# Patient Record
Sex: Male | Born: 1960 | Race: Black or African American | Hispanic: No | Marital: Married | Smoking: Former smoker
Health system: Southern US, Community
[De-identification: ages and names within clinical notes are randomized; demographics above are authoritative.]

## PROBLEM LIST (undated history)

## (undated) DIAGNOSIS — I10 Essential (primary) hypertension: Secondary | ICD-10-CM

## (undated) DIAGNOSIS — E7800 Pure hypercholesterolemia, unspecified: Secondary | ICD-10-CM

## (undated) DIAGNOSIS — I509 Heart failure, unspecified: Secondary | ICD-10-CM

## (undated) DIAGNOSIS — K219 Gastro-esophageal reflux disease without esophagitis: Secondary | ICD-10-CM

## (undated) DIAGNOSIS — R0600 Dyspnea, unspecified: Secondary | ICD-10-CM

## (undated) DIAGNOSIS — M549 Dorsalgia, unspecified: Secondary | ICD-10-CM

## (undated) DIAGNOSIS — G4730 Sleep apnea, unspecified: Secondary | ICD-10-CM

## (undated) DIAGNOSIS — M1990 Unspecified osteoarthritis, unspecified site: Secondary | ICD-10-CM

## (undated) HISTORY — PX: BACK SURGERY: SHX140

## (undated) HISTORY — PX: MANDIBLE FRACTURE SURGERY: SHX706

---

## 2007-07-13 ENCOUNTER — Encounter: Admission: RE | Admit: 2007-07-13 | Discharge: 2007-07-13 | Payer: Self-pay | Admitting: Neurology

## 2010-10-30 ENCOUNTER — Emergency Department (HOSPITAL_BASED_OUTPATIENT_CLINIC_OR_DEPARTMENT_OTHER)
Admission: EM | Admit: 2010-10-30 | Discharge: 2010-10-30 | Disposition: A | Discharge status: Home / Self Care | Payer: Self-pay | Attending: Emergency Medicine | Admitting: Emergency Medicine

## 2010-10-30 ENCOUNTER — Encounter: Payer: Self-pay | Admitting: *Deleted

## 2010-10-30 ENCOUNTER — Emergency Department (INDEPENDENT_AMBULATORY_CARE_PROVIDER_SITE_OTHER): Payer: Self-pay

## 2010-10-30 DIAGNOSIS — M25562 Pain in left knee: Secondary | ICD-10-CM

## 2010-10-30 DIAGNOSIS — M25569 Pain in unspecified knee: Secondary | ICD-10-CM

## 2010-10-30 DIAGNOSIS — I10 Essential (primary) hypertension: Secondary | ICD-10-CM | POA: Insufficient documentation

## 2010-10-30 DIAGNOSIS — E7800 Pure hypercholesterolemia, unspecified: Secondary | ICD-10-CM | POA: Insufficient documentation

## 2010-10-30 DIAGNOSIS — E119 Type 2 diabetes mellitus without complications: Secondary | ICD-10-CM | POA: Insufficient documentation

## 2010-10-30 HISTORY — DX: Pure hypercholesterolemia, unspecified: E78.00

## 2010-10-30 HISTORY — DX: Essential (primary) hypertension: I10

## 2010-10-30 MED ORDER — METHYLPREDNISOLONE SODIUM SUCC 125 MG IJ SOLR
125.0000 mg | Freq: Once | INTRAMUSCULAR | Status: AC
  Administered 2010-10-30: 125 mg via INTRAMUSCULAR

## 2010-10-30 MED ORDER — METHYLPREDNISOLONE 4 MG PO KIT: PACK | ORAL | Status: AC

## 2010-10-30 NOTE — ED Notes (Signed)
Pt reports left knee pain since last night. Described as aching and constant. Worsens with sitting for long periods of time. Limited ROM d/t pain. Using a cane for ambulatory assistance.

## 2010-10-30 NOTE — ED Notes (Addendum)
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 Hx gout, but states pain usually occurs in his ankles. Took an indomethacin tab and motrin 800mg  PTA

## 2010-10-30 NOTE — ED Provider Notes (Signed)
History   L knee pain h/o gout, has gout medications at home but has not started them.  Pain started yesterday worse with moving and hurts to bear weight, no fall, trauma or known injury. No rash, swelling or fevers.   CSN: [id number] Arrival date & time: 10/30/2010  2:50 AM  Chief Complaint  Patient presents with  . Knee Pain   Patient is a 50 y.o. male presenting with knee pain. The history is provided by the patient.  Knee Pain This is a recurrent problem. The current episode started yesterday. The problem occurs constantly. The problem has not changed since onset.Pertinent negatives include no chest pain, no abdominal pain, no headaches and no shortness of breath. The symptoms are aggravated by walking and bending. The symptoms are relieved by nothing. Treatments tried: took ibupriofen PTA. The treatment provided no relief.    Past Medical History  Diagnosis Date  . Hypertension   . Gout   . Diabetes mellitus   . High cholesterol     Past Surgical History  Procedure Date  . Back surgery     No family history on file.  History  Substance Use Topics  . Smoking status: Current Everyday Smoker  . Smokeless tobacco: Not on file  . Alcohol Use: No      Review of Systems  Constitutional: Negative for fever and chills.  HENT: Negative for neck pain and neck stiffness.   Eyes: Negative for pain.  Respiratory: Negative for shortness of breath.   Cardiovascular: Negative for chest pain.  Gastrointestinal: Negative for abdominal pain.  Genitourinary: Negative for dysuria.  Musculoskeletal: Negative for myalgias, back pain and joint swelling.  Skin: Negative for rash.  Neurological: Negative for headaches.  All other systems reviewed and are negative.    Physical Exam  BP 198/78  Pulse 78  Temp(Src) 98 F (36.7 C) (Oral)  Resp 16  Ht 5\' 11"  (1.803 m)  Wt 318 lb (144.244 kg)  BMI 44.35 kg/m2  SpO2 100%  Physical Exam  Constitutional: He is oriented to person,  place, and time. He appears well-developed and well-nourished.  HENT:  Head: Normocephalic and atraumatic.  Eyes: Conjunctivae and EOM are normal. Pupils are equal, round, and reactive to light.  Neck: Full passive range of motion without pain. Neck supple. No thyromegaly present.  Cardiovascular: Normal rate, regular rhythm, S1 normal, S2 normal and intact distal pulses.   Pulmonary/Chest: Effort normal and breath sounds normal.  Abdominal: Soft. Bowel sounds are normal. There is no tenderness. There is no CVA tenderness.  Musculoskeletal: Normal range of motion.       LLE: TTP over patella and superior aspect of knee, no erythema, no warmth, no swelling, good ROM no deformity, no posterior tenderness, no calf swelling, no cords, distal N/V intact  Neurological: He is alert and oriented to person, place, and time. He has normal strength and normal reflexes. No cranial nerve deficit or sensory deficit. He displays a negative Romberg sign. GCS eye subscore is 4. GCS verbal subscore is 5. GCS motor subscore is 6.       Ambulates with a cane  Skin: Skin is warm and dry. No rash noted. No cyanosis. Nails show no clubbing.  Psychiatric: He has a normal mood and affect. His speech is normal and behavior is normal.    ED Course  Procedures  MDM L knee pain gout versus possible osteoarthritis.  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  Xray obtained and reviewed. Plan medrol dose pack- PT aware will elevate his sugars.  He will take his gout medications as prescribed by his physician and follow up in the clinic.   Dg Knee 2 Views Left  10/30/2010  *RADIOLOGY REPORT*  Clinical Data: Sudden onset of left knee pain.  LEFT KNEE - 1-2 VIEW  Comparison: None.  Findings: There is no evidence of fracture or dislocation.  The joint spaces are preserved.  No significant degenerative change is seen; the patellofemoral  joint is grossly unremarkable in appearance.  An enthesophyte is noted at the superior pole of the patella.  No significant joint effusion is seen.  The visualized soft tissues are normal in appearance.  IMPRESSION: No evidence of fracture or dislocation.  Original Report Authenticated By: Tonia Ghent, M.D.        Sunnie Nielsen, MD 10/30/10 [phone]

## 2011-02-25 ENCOUNTER — Encounter (HOSPITAL_BASED_OUTPATIENT_CLINIC_OR_DEPARTMENT_OTHER): Payer: Self-pay | Admitting: *Deleted

## 2011-02-25 ENCOUNTER — Emergency Department (HOSPITAL_BASED_OUTPATIENT_CLINIC_OR_DEPARTMENT_OTHER)
Admission: EM | Admit: 2011-02-25 | Discharge: 2011-02-25 | Disposition: A | Discharge status: Home / Self Care | Payer: BC Managed Care – PPO | Attending: Emergency Medicine | Admitting: Emergency Medicine

## 2011-02-25 DIAGNOSIS — Z79899 Other long term (current) drug therapy: Secondary | ICD-10-CM | POA: Insufficient documentation

## 2011-02-25 DIAGNOSIS — E119 Type 2 diabetes mellitus without complications: Secondary | ICD-10-CM | POA: Insufficient documentation

## 2011-02-25 DIAGNOSIS — I10 Essential (primary) hypertension: Secondary | ICD-10-CM | POA: Insufficient documentation

## 2011-02-25 DIAGNOSIS — E7800 Pure hypercholesterolemia, unspecified: Secondary | ICD-10-CM | POA: Insufficient documentation

## 2011-02-25 DIAGNOSIS — M5430 Sciatica, unspecified side: Secondary | ICD-10-CM

## 2011-02-25 MED ORDER — DIAZEPAM 5 MG/ML IJ SOLN
10.0000 mg | Freq: Once | INTRAMUSCULAR | Status: AC
  Administered 2011-02-25: 10 mg via INTRAMUSCULAR

## 2011-02-25 MED ORDER — CYCLOBENZAPRINE HCL 10 MG PO TABS: 10.0000 mg | ORAL_TABLET | Freq: Two times a day (BID) | ORAL | Status: AC | PRN

## 2011-02-25 MED ORDER — HYDROMORPHONE HCL PF 2 MG/ML IJ SOLN
2.0000 mg | Freq: Once | INTRAMUSCULAR | Status: AC
  Administered 2011-02-25: 2 mg via INTRAMUSCULAR
  Filled 2011-02-25: qty 1

## 2011-02-25 MED ORDER — DIAZEPAM 5 MG/ML IJ SOLN
10.0000 mg | Freq: Once | INTRAMUSCULAR | Status: DC
  Filled 2011-02-25: qty 2

## 2011-02-25 NOTE — ED Notes (Signed)
Pt was seen at an urgent care last night and given vicodin and ibuprofen. Pt received toradol IM and "a steroid shot". Pt states no improvement in pain. Pt c/o lower back pain radiating down left leg.

## 2011-02-25 NOTE — ED Provider Notes (Signed)
History     CSN: [id number] Arrival date & time: 02/25/2011  7:21 PM   First MD Initiated Contact with Patient 02/25/11 1936      Chief Complaint  Patient presents with  . Back Pain    (Consider location/radiation/quality/duration/timing/severity/associated sxs/prior treatment) HPI Comments: Pt states that he was seen at urgent care yesterday and was treated with toradol and steroids:pt states that he is not getting any relief from his vicodin at home  Patient is a 50 y.o. male presenting with back pain. The history is provided by the patient. No language interpreter was used.  Back Pain  This is a new problem. The current episode started 2 days ago. The problem occurs constantly. The problem has not changed since onset.The pain is associated with no known injury. The pain is present in the lumbar spine. The quality of the pain is described as aching. The pain radiates to the left thigh. The pain is severe. The symptoms are aggravated by bending, twisting and certain positions. The pain is the same all the time. Pertinent negatives include no abdominal pain, no bowel incontinence, no perianal numbness, no bladder incontinence, no dysuria and no weakness.    Past Medical History  Diagnosis Date  . Hypertension   . Gout   . Diabetes mellitus   . High cholesterol     Past Surgical History  Procedure Date  . Back surgery     History reviewed. No pertinent family history.  History  Substance Use Topics  . Smoking status: Current Everyday Smoker  . Smokeless tobacco: Not on file  . Alcohol Use: No      Review of Systems  Gastrointestinal: Negative for abdominal pain and bowel incontinence.  Genitourinary: Negative for bladder incontinence and dysuria.  Musculoskeletal: Positive for back pain.  Neurological: Negative for weakness.  All other systems reviewed and are negative.    Allergies  Aspirin  Home Medications   Current Outpatient Rx  Name Route Sig Dispense  Refill  . FUROSEMIDE 20 MG PO TABS Oral Take 20 mg by mouth daily.     Marland Kitchen HYDROCODONE-ACETAMINOPHEN 5-500 MG PO TABS Oral Take 2 tablets by mouth every 6 (six) hours as needed. For pain     . IBUPROFEN 800 MG PO TABS Oral Take 800 mg by mouth 2 (two) times daily.      . INDOMETHACIN 50 MG PO CAPS Oral Take 50 mg by mouth 3 (three) times daily as needed.      Marland Kitchen LABETALOL HCL 200 MG PO TABS Oral Take 200 mg by mouth 3 (three) times daily.      Marland Kitchen LISINOPRIL 40 MG PO TABS Oral Take 40 mg by mouth daily.      Marland Kitchen METFORMIN HCL 500 MG PO TABS Oral Take 500 mg by mouth 2 (two) times daily with a meal.      . SIMVASTATIN 40 MG PO TABS Oral Take 40 mg by mouth at bedtime.      . ALLOPURINOL 100 MG PO TABS Oral Take 100 mg by mouth daily as needed. For gout      BP 220/100  Pulse 74  Temp 98.4 F (36.9 C)  Resp 18  Ht 6' (1.829 m)  Wt 324 lb (146.965 kg)  BMI 43.94 kg/m2  SpO2 97%  Physical Exam  Nursing note and vitals reviewed. Constitutional: He is oriented to person, place, and time. He appears well-developed.  HENT:  Head: Normocephalic and atraumatic.  Cardiovascular: Normal rate and regular  rhythm.   Pulmonary/Chest: Effort normal and breath sounds normal.  Abdominal: Soft. Bowel sounds are normal.  Musculoskeletal: Normal range of motion.       Pt has left sciatic notch tenderness:pt moves all extremities without any problem  Neurological: He is alert and oriented to person, place, and time.  Skin: Skin is warm and dry.  Psychiatric: He has a normal mood and affect.    ED Course  Procedures (including critical care time)  Labs Reviewed - No data to display No results found.   1. Sciatica       MDM  Pt feeling somewhat better:pt already has pain medications at home   Medical screening examination/treatment/procedure(s) were performed by non-physician practitioner and as supervising physician I was immediately available for consultation/collaboration. Osvaldo Human,  M.D.      Teressa Lower, NP 02/25/11 2127  Carleene Cooper III, MD 02/26/11 [phone]

## 2011-02-25 NOTE — ED Notes (Signed)
Pt c/o lower back pain which radiates down left leg , seen by [facility] yesterday and vicodin and motrin are not working

## 2011-06-24 ENCOUNTER — Emergency Department (INDEPENDENT_AMBULATORY_CARE_PROVIDER_SITE_OTHER): Payer: BC Managed Care – PPO

## 2011-06-24 ENCOUNTER — Emergency Department (HOSPITAL_BASED_OUTPATIENT_CLINIC_OR_DEPARTMENT_OTHER)
Admission: EM | Admit: 2011-06-24 | Discharge: 2011-06-24 | Disposition: A | Discharge status: Home / Self Care | Payer: BC Managed Care – PPO | Attending: Emergency Medicine | Admitting: Emergency Medicine

## 2011-06-24 ENCOUNTER — Encounter (HOSPITAL_BASED_OUTPATIENT_CLINIC_OR_DEPARTMENT_OTHER): Payer: Self-pay | Admitting: Family Medicine

## 2011-06-24 DIAGNOSIS — R059 Cough, unspecified: Secondary | ICD-10-CM

## 2011-06-24 DIAGNOSIS — R05 Cough: Secondary | ICD-10-CM | POA: Insufficient documentation

## 2011-06-24 DIAGNOSIS — Z79899 Other long term (current) drug therapy: Secondary | ICD-10-CM | POA: Insufficient documentation

## 2011-06-24 DIAGNOSIS — Z862 Personal history of diseases of the blood and blood-forming organs and certain disorders involving the immune mechanism: Secondary | ICD-10-CM | POA: Insufficient documentation

## 2011-06-24 DIAGNOSIS — Z8639 Personal history of other endocrine, nutritional and metabolic disease: Secondary | ICD-10-CM | POA: Insufficient documentation

## 2011-06-24 DIAGNOSIS — M549 Dorsalgia, unspecified: Secondary | ICD-10-CM | POA: Insufficient documentation

## 2011-06-24 DIAGNOSIS — R61 Generalized hyperhidrosis: Secondary | ICD-10-CM | POA: Insufficient documentation

## 2011-06-24 DIAGNOSIS — R110 Nausea: Secondary | ICD-10-CM | POA: Insufficient documentation

## 2011-06-24 DIAGNOSIS — R918 Other nonspecific abnormal finding of lung field: Secondary | ICD-10-CM

## 2011-06-24 DIAGNOSIS — J3489 Other specified disorders of nose and nasal sinuses: Secondary | ICD-10-CM | POA: Insufficient documentation

## 2011-06-24 DIAGNOSIS — R509 Fever, unspecified: Secondary | ICD-10-CM | POA: Insufficient documentation

## 2011-06-24 DIAGNOSIS — R079 Chest pain, unspecified: Secondary | ICD-10-CM | POA: Insufficient documentation

## 2011-06-24 DIAGNOSIS — I10 Essential (primary) hypertension: Secondary | ICD-10-CM | POA: Insufficient documentation

## 2011-06-24 DIAGNOSIS — IMO0001 Reserved for inherently not codable concepts without codable children: Secondary | ICD-10-CM | POA: Insufficient documentation

## 2011-06-24 DIAGNOSIS — E119 Type 2 diabetes mellitus without complications: Secondary | ICD-10-CM | POA: Insufficient documentation

## 2011-06-24 DIAGNOSIS — J189 Pneumonia, unspecified organism: Secondary | ICD-10-CM

## 2011-06-24 DIAGNOSIS — E7800 Pure hypercholesterolemia, unspecified: Secondary | ICD-10-CM | POA: Insufficient documentation

## 2011-06-24 HISTORY — DX: Dorsalgia, unspecified: M54.9

## 2011-06-24 LAB — BASIC METABOLIC PANEL
BUN: 12 mg/dL (ref 6–23)
CO2: 26 mEq/L (ref 19–32)
Chloride: 105 mEq/L (ref 96–112)
GFR calc non Af Amer: 78 mL/min — ABNORMAL LOW (ref >90)
Glucose, Bld: 89 mg/dL (ref 70–99)
Potassium: 3.3 mEq/L — ABNORMAL LOW (ref 3.5–5.1)
Sodium: 141 mEq/L (ref 135–145)

## 2011-06-24 LAB — CBC
HCT: 35.9 % — ABNORMAL LOW (ref 39.0–52.0)
Hemoglobin: 12.2 g/dL — ABNORMAL LOW (ref 13.0–17.0)
MCHC: 34 g/dL (ref 30.0–36.0)
RBC: 4.2 MIL/uL — ABNORMAL LOW (ref 4.22–5.81)
WBC: 6.1 10*3/uL (ref 4.0–10.5)

## 2011-06-24 MED ORDER — DEXTROSE 5 % IV SOLN
1.0000 g | Freq: Once | INTRAVENOUS | Status: AC
  Administered 2011-06-24: 1 g via INTRAVENOUS
  Filled 2011-06-24: qty 10

## 2011-06-24 MED ORDER — SODIUM CHLORIDE 0.9 % IV BOLUS (SEPSIS)
500.0000 mL | Freq: Once | INTRAVENOUS | Status: AC
  Administered 2011-06-24: 500 mL via INTRAVENOUS

## 2011-06-24 MED ORDER — ONDANSETRON HCL 4 MG/2ML IJ SOLN
4.0000 mg | Freq: Once | INTRAMUSCULAR | Status: AC
  Administered 2011-06-24: 4 mg via INTRAVENOUS
  Filled 2011-06-24: qty 2

## 2011-06-24 MED ORDER — SODIUM CHLORIDE 0.9 % IV SOLN: INTRAVENOUS | Status: DC

## 2011-06-24 MED ORDER — AZITHROMYCIN 250 MG PO TABS: 250.0000 mg | ORAL_TABLET | Freq: Every day | ORAL | Status: AC

## 2011-06-24 MED ORDER — ALBUTEROL SULFATE HFA 108 (90 BASE) MCG/ACT IN AERS: 1.0000 | INHALATION_SPRAY | Freq: Four times a day (QID) | RESPIRATORY_TRACT | Status: DC | PRN

## 2011-06-24 NOTE — Discharge Instructions (Signed)
Pneumonia, Adult Pneumonia is an infection of the lungs. It may be caused by a germ (virus or bacteria). Some types of pneumonia can spread easily from person to person. This can happen when you cough or sneeze. HOME CARE  Only take medicine as told by your doctor.   Take your medicine (antibiotics) as told. Finish it even if you start to feel better.   Do not smoke.   You may use a vaporizer or humidifier in your room. This can help loosen thick spit (mucus).   Sleep so you are almost sitting up (semi-upright). This helps reduce coughing.   Rest.  A shot (vaccine) can help prevent pneumonia. Shots are often advised for:  People over 35 years old.   Patients on chemotherapy.   People with long-term (chronic) lung problems.   People with immune system problems.  GET HELP RIGHT AWAY IF:   You are getting worse.   You cannot control your cough, and you are losing sleep.   You cough up blood.   Your pain gets worse, even with medicine.   You have a fever.   Any of your problems are getting worse, not better.   You have shortness of breath or chest pain.  MAKE SURE YOU:   Understand these instructions.   Will watch your condition.   Will get help right away if you are not doing well or get worse.  Document Released: 08/12/2007 Document Revised: 02/12/2011 Document Reviewed: 05/16/2010 [facility] Patient Information 2012 [place], [place].  Take medications as directed use albuterol inhaler 2 puffs every 6 hours for the next week. Take antibiotic as directed. Return if not improving in 24 hours or for new or worse symptoms.

## 2011-06-24 NOTE — ED Notes (Signed)
Pt called stating that when he woke up this afternoon, he had blood in his sputum. Pt advised that he should return to ED if concerned for further eval.

## 2011-06-24 NOTE — ED Notes (Signed)
Pt c/o cough, chills and congestion since Sunday. Pt also c/o low back pain with history of back pain. Pt sts he is taking ibuprofen and otc cold meds.

## 2011-06-24 NOTE — ED Provider Notes (Signed)
History     CSN: [id number]  Arrival date & time 06/24/11  0736   First MD Initiated Contact with Patient 06/24/11 [phone]      Chief Complaint  Patient presents with  . Cough  . Nasal Congestion  . Chills    (Consider location/radiation/quality/duration/timing/severity/associated sxs/prior treatment) Patient is a 51 y.o. male presenting with cough. The history is provided by the patient.  Cough This is a new problem. The current episode started more than 2 days ago (3 days ago.). The problem occurs every few minutes. The problem has been gradually worsening. The cough is productive of sputum. Maximum temperature: Patient feels feverish but not documented. Associated symptoms include chest pain, chills, sweats and myalgias. Pertinent negatives include no ear pain, no headaches, no sore throat, no shortness of breath, no wheezing and no eye redness.    Patient with onset of cough congestion bodyaches 3 days ago now with productive yellow sputum. Associated with nausea but no vomiting. Fevers feeling but not documented.  Past Medical History  Diagnosis Date  . Hypertension   . Gout   . Diabetes mellitus   . High cholesterol   . Back pain     Past Surgical History  Procedure Date  . Back surgery     No family history on file.  History  Substance Use Topics  . Smoking status: Current Everyday Smoker -- 0.5 packs/day    Types: Cigarettes  . Smokeless tobacco: Not on file  . Alcohol Use: No      Review of Systems  Constitutional: Positive for fever, chills and diaphoresis.  HENT: Positive for congestion. Negative for ear pain, sore throat and neck pain.   Eyes: Negative for redness.  Respiratory: Positive for cough. Negative for shortness of breath and wheezing.   Cardiovascular: Positive for chest pain.  Gastrointestinal: Positive for nausea. Negative for vomiting, abdominal pain and diarrhea.  Genitourinary: Negative for dysuria.  Musculoskeletal: Positive for  myalgias and back pain.  Skin: Negative for rash.  Neurological: Negative for headaches.  Hematological: Does not bruise/bleed easily.    Allergies  Aspirin  Home Medications   Current Outpatient Rx  Name Route Sig Dispense Refill  . HYDROMORPHONE HCL ER PO Oral Take by mouth.    . ALBUTEROL SULFATE HFA 108 (90 BASE) MCG/ACT IN AERS Inhalation Inhale 1-2 puffs into the lungs every 6 (six) hours as needed for wheezing. 1 Inhaler 0  . ALLOPURINOL 100 MG PO TABS Oral Take 100 mg by mouth daily as needed. For gout    . AZITHROMYCIN 250 MG PO TABS Oral Take 1 tablet (250 mg total) by mouth daily. Take first 2 tablets together, then 1 every day until finished. 6 tablet 0  . FUROSEMIDE 20 MG PO TABS Oral Take 20 mg by mouth daily.     Marland Kitchen HYDROCODONE-ACETAMINOPHEN 5-500 MG PO TABS Oral Take 2 tablets by mouth every 6 (six) hours as needed. For pain     . IBUPROFEN 800 MG PO TABS Oral Take 800 mg by mouth 2 (two) times daily.      . INDOMETHACIN 50 MG PO CAPS Oral Take 50 mg by mouth 3 (three) times daily as needed.      Marland Kitchen LABETALOL HCL 200 MG PO TABS Oral Take 200 mg by mouth 3 (three) times daily.      Marland Kitchen LISINOPRIL 40 MG PO TABS Oral Take 40 mg by mouth daily.      Marland Kitchen METFORMIN HCL 500 MG PO TABS  Oral Take 500 mg by mouth 2 (two) times daily with a meal.      . SIMVASTATIN 40 MG PO TABS Oral Take 40 mg by mouth at bedtime.        BP 171/65  Pulse 83  Temp(Src) 98.9 F (37.2 C) (Oral)  Resp 24  SpO2 98%  Physical Exam  Nursing note and vitals reviewed. Constitutional: He is oriented to person, place, and time. He appears well-developed and well-nourished. No distress.  HENT:  Head: Normocephalic and atraumatic.  Mouth/Throat: Oropharynx is clear and moist. No oropharyngeal exudate.  Eyes: Conjunctivae and EOM are normal. Pupils are equal, round, and reactive to light.  Neck: Normal range of motion. Neck supple.  Cardiovascular: Normal rate, regular rhythm and normal heart sounds.     No murmur heard. Pulmonary/Chest: Effort normal and breath sounds normal. No respiratory distress. He has no wheezes. He has no rales.  Abdominal: Soft. Bowel sounds are normal. There is no tenderness.  Musculoskeletal: Normal range of motion. He exhibits no tenderness.  Neurological: He is alert and oriented to person, place, and time. No cranial nerve deficit. He exhibits normal muscle tone. Coordination normal.  Skin: Skin is warm. No rash noted.    ED Course  Procedures (including critical care time)  Labs Reviewed  CBC - Abnormal; Notable for the following:    RBC 4.20 (*)    Hemoglobin 12.2 (*)    HCT 35.9 (*)    All other components within normal limits  BASIC METABOLIC PANEL - Abnormal; Notable for the following:    Potassium 3.3 (*)    GFR calc non Af Amer 78 (*)    All other components within normal limits   Dg Chest 2 View  06/24/2011  *RADIOLOGY REPORT*  Clinical Data: Cough, congestion, fever  CHEST - 2 VIEW  Comparison: 07/13/2007  Findings: Patchy right upper lobe opacity, compatible with pneumonia. No pleural effusion or pneumothorax.  Cardiomediastinal silhouette is within normal limits.  Degenerative changes of the visualized thoracolumbar spine.  IMPRESSION: Patchy right upper lobe opacity, compatible with pneumonia.  Follow-up radiographs are suggested in 10-14 days to document clearing.  Original Report Authenticated By: Charline Bills, M.D.     1. CAP (community acquired pneumonia)       MDM  Chest x-ray consistent with pneumonia. Treated with 1 g of Rocephin IV piggyback in the emergency department basic labs ordered without significant abnormalities. Will discharge home with Z-Pak and albuterol inhaler. Patient's oxygen saturation in the emergency department is normal at 98% room air.        Shelda Jakes, MD 06/24/11 [phone]

## 2011-07-25 ENCOUNTER — Encounter (HOSPITAL_BASED_OUTPATIENT_CLINIC_OR_DEPARTMENT_OTHER): Payer: Self-pay | Admitting: *Deleted

## 2011-07-25 ENCOUNTER — Emergency Department (HOSPITAL_BASED_OUTPATIENT_CLINIC_OR_DEPARTMENT_OTHER)
Admission: EM | Admit: 2011-07-25 | Discharge: 2011-07-25 | Disposition: A | Discharge status: Home / Self Care | Payer: BC Managed Care – PPO | Attending: Emergency Medicine | Admitting: Emergency Medicine

## 2011-07-25 ENCOUNTER — Emergency Department (HOSPITAL_BASED_OUTPATIENT_CLINIC_OR_DEPARTMENT_OTHER): Payer: BC Managed Care – PPO

## 2011-07-25 DIAGNOSIS — R5381 Other malaise: Secondary | ICD-10-CM | POA: Insufficient documentation

## 2011-07-25 DIAGNOSIS — E119 Type 2 diabetes mellitus without complications: Secondary | ICD-10-CM | POA: Insufficient documentation

## 2011-07-25 DIAGNOSIS — I10 Essential (primary) hypertension: Secondary | ICD-10-CM | POA: Insufficient documentation

## 2011-07-25 DIAGNOSIS — J069 Acute upper respiratory infection, unspecified: Secondary | ICD-10-CM | POA: Insufficient documentation

## 2011-07-25 DIAGNOSIS — R05 Cough: Secondary | ICD-10-CM

## 2011-07-25 DIAGNOSIS — IMO0001 Reserved for inherently not codable concepts without codable children: Secondary | ICD-10-CM | POA: Insufficient documentation

## 2011-07-25 DIAGNOSIS — R6883 Chills (without fever): Secondary | ICD-10-CM | POA: Insufficient documentation

## 2011-07-25 DIAGNOSIS — Z79899 Other long term (current) drug therapy: Secondary | ICD-10-CM | POA: Insufficient documentation

## 2011-07-25 DIAGNOSIS — R059 Cough, unspecified: Secondary | ICD-10-CM | POA: Insufficient documentation

## 2011-07-25 DIAGNOSIS — R110 Nausea: Secondary | ICD-10-CM | POA: Insufficient documentation

## 2011-07-25 MED ORDER — BENZONATATE 100 MG PO CAPS: 100.0000 mg | ORAL_CAPSULE | Freq: Three times a day (TID) | ORAL | Status: AC

## 2011-07-25 MED ORDER — ALBUTEROL SULFATE HFA 108 (90 BASE) MCG/ACT IN AERS
2.0000 | INHALATION_SPRAY | RESPIRATORY_TRACT | Status: DC | PRN
  Administered 2011-07-25: 2 via RESPIRATORY_TRACT
  Filled 2011-07-25: qty 6.7

## 2011-07-25 MED ORDER — IBUPROFEN 800 MG PO TABS
800.0000 mg | ORAL_TABLET | Freq: Once | ORAL | Status: AC
  Administered 2011-07-25: 800 mg via ORAL
  Filled 2011-07-25: qty 1

## 2011-07-25 MED ORDER — ALBUTEROL SULFATE (5 MG/ML) 0.5% IN NEBU
5.0000 mg | INHALATION_SOLUTION | Freq: Once | RESPIRATORY_TRACT | Status: AC
  Administered 2011-07-25: 5 mg via RESPIRATORY_TRACT
  Filled 2011-07-25: qty 1

## 2011-07-25 NOTE — ED Provider Notes (Signed)
History     CSN: [id number]  Arrival date & time 07/25/11  1327   First MD Initiated Contact with Patient 07/25/11 1414      Chief Complaint  Patient presents with  . Cough    (Consider location/radiation/quality/duration/timing/severity/associated sxs/prior treatment) Patient is a 51 y.o. male presenting with cough. The history is provided by the patient and the spouse. No language interpreter was used.  Cough This is a chronic problem. The current episode started more than 2 days ago. The problem occurs hourly. The problem has not changed since onset.The cough is non-productive. There has been no fever. Associated symptoms include chills, sore throat and myalgias. Pertinent negatives include no chest pain, no ear pain, no shortness of breath and no wheezing. He has tried decongestants for the symptoms. The treatment provided no relief. He is a smoker. His past medical history is significant for pneumonia.  Cough sore throat nausea and general weakness x 3 days with recent pneumonia in April.   Quit smoking 3 weeks ago with increased chest congestion pmh diabetes, hypertension, gout and back pain.  Past Medical History  Diagnosis Date  . Hypertension   . Gout   . Diabetes mellitus   . High cholesterol   . Back pain     Past Surgical History  Procedure Date  . Back surgery     No family history on file.  History  Substance Use Topics  . Smoking status: Current Everyday Smoker -- 0.5 packs/day    Types: Cigarettes  . Smokeless tobacco: Not on file  . Alcohol Use: No      Review of Systems  Constitutional: Positive for chills and fatigue. Negative for fever.  HENT: Positive for sore throat. Negative for ear pain.   Respiratory: Positive for cough. Negative for shortness of breath and wheezing.   Cardiovascular: Negative for chest pain.  Gastrointestinal: Positive for nausea. Negative for vomiting.  Musculoskeletal: Positive for myalgias.  Skin: Negative.     Neurological: Negative.   Hematological: Negative.   Psychiatric/Behavioral: Negative.     Allergies  Aspirin  Home Medications   Current Outpatient Rx  Name Route Sig Dispense Refill  . ALBUTEROL SULFATE HFA 108 (90 BASE) MCG/ACT IN AERS Inhalation Inhale 1-2 puffs into the lungs every 6 (six) hours as needed for wheezing. 1 Inhaler 0  . ALLOPURINOL 100 MG PO TABS Oral Take 100 mg by mouth daily as needed. For gout    . FUROSEMIDE 20 MG PO TABS Oral Take 20 mg by mouth daily.     Marland Kitchen HYDROCODONE-ACETAMINOPHEN 5-500 MG PO TABS Oral Take 2 tablets by mouth every 6 (six) hours as needed. For pain     . HYDROMORPHONE HCL ER PO Oral Take by mouth.    . IBUPROFEN 800 MG PO TABS Oral Take 800 mg by mouth 2 (two) times daily.      . INDOMETHACIN 50 MG PO CAPS Oral Take 50 mg by mouth 3 (three) times daily as needed.      Marland Kitchen LABETALOL HCL 200 MG PO TABS Oral Take 200 mg by mouth 3 (three) times daily.      Marland Kitchen LISINOPRIL 40 MG PO TABS Oral Take 40 mg by mouth daily.      Marland Kitchen METFORMIN HCL 500 MG PO TABS Oral Take 500 mg by mouth 2 (two) times daily with a meal.      . SIMVASTATIN 40 MG PO TABS Oral Take 40 mg by mouth at bedtime.  BP 208/88  Pulse 96  Temp(Src) 98.2 F (36.8 C) (Oral)  Resp 20  Ht 6' (1.829 m)  Wt 315 lb (142.883 kg)  BMI 42.72 kg/m2  SpO2 96%  Physical Exam  Nursing note and vitals reviewed. Constitutional: He is oriented to person, place, and time. He appears well-developed and well-nourished.  HENT:  Head: Normocephalic.  Left Ear: Tympanic membrane normal.  Nose: Mucosal edema and rhinorrhea present.  Mouth/Throat: Uvula is midline and mucous membranes are normal. Posterior oropharyngeal erythema present. No oropharyngeal exudate, posterior oropharyngeal edema or tonsillar abscesses.  Eyes: Conjunctivae and EOM are normal. Pupils are equal, round, and reactive to light.  Neck: Normal range of motion. Neck supple.  Cardiovascular: Normal rate.    Pulmonary/Chest: Effort normal and breath sounds normal. No respiratory distress. He has no wheezes. He has no rales.  Abdominal: Soft.  Musculoskeletal: Normal range of motion.  Neurological: He is alert and oriented to person, place, and time.  Skin: Skin is warm and dry.  Psychiatric: He has a normal mood and affect.    ED Course  Procedures (including critical care time)   Labs Reviewed  RAPID STREP SCREEN   Dg Chest 2 View  07/25/2011  *RADIOLOGY REPORT*  Clinical Data: Follow up pneumonia.  Cough  CHEST - 2 VIEW  Comparison: 06/24/2011  Findings: Cardiac enlargement without heart failure.  Slight vascular congestion.  Right upper lobe infiltrate has resolved.  Currently no pneumonia is identified.  There is no pleural effusion.  IMPRESSION: Resolution of right upper lobe infiltrate.  Cardiac enlargement with mild vascular congestion.  Original Report Authenticated By: Camelia Phenes, M.D.     No diagnosis found.    MDM  Upper respiratory infection x 3 days.  Chest x-ray shows no pneumonia.  Negative strep.  Better after albuterol neb in the ER. Continue over the counter meds.  Inhaler provided.  Follow up next week with pcp.        Remi Haggard, NP 07/26/11 1144  Remi Haggard, NP 07/26/11 1153

## 2011-07-25 NOTE — Discharge Instructions (Signed)
Isaac Valdez x-ray today does not show pneumonia. Your strep test was negative. You have an upper respiratory infection. We gave you a albuterol treatment in the ER today and an inhaler to take home to use no more than 4 times a day. Continue taking the ibuprofen for comfort and fever. Take Benadryl at night to help you sleep and help with her postnasal drip and cough. Tessalon Perles prescription as for your cough as well. Return for extreme chest pain or shortness of breath. Followup with your PCP next week if not better.

## 2011-07-25 NOTE — ED Notes (Signed)
Pt states he was seen here 3-4 weeks ago and dx'd with pneumonia. On Wed, had episode of nausea, felt weak and dizzy. Thursday developed throat pain and chest congestion, cough. No relief with OTC meds.

## 2011-07-26 NOTE — ED Provider Notes (Signed)
Medical screening examination/treatment/procedure(s) were performed by non-physician practitioner and as supervising physician I was immediately available for consultation/collaboration.   Gavin Pound. Kaelon Weekes, MD 07/26/11 1547

## 2011-12-07 ENCOUNTER — Emergency Department (HOSPITAL_BASED_OUTPATIENT_CLINIC_OR_DEPARTMENT_OTHER): Payer: BC Managed Care – PPO

## 2011-12-07 ENCOUNTER — Encounter (HOSPITAL_BASED_OUTPATIENT_CLINIC_OR_DEPARTMENT_OTHER): Payer: Self-pay | Admitting: *Deleted

## 2011-12-07 ENCOUNTER — Emergency Department (HOSPITAL_BASED_OUTPATIENT_CLINIC_OR_DEPARTMENT_OTHER)
Admission: EM | Admit: 2011-12-07 | Discharge: 2011-12-07 | Disposition: A | Discharge status: Home / Self Care | Payer: BC Managed Care – PPO | Attending: Emergency Medicine | Admitting: Emergency Medicine

## 2011-12-07 DIAGNOSIS — M19049 Primary osteoarthritis, unspecified hand: Secondary | ICD-10-CM | POA: Insufficient documentation

## 2011-12-07 DIAGNOSIS — Z886 Allergy status to analgesic agent status: Secondary | ICD-10-CM | POA: Insufficient documentation

## 2011-12-07 DIAGNOSIS — E119 Type 2 diabetes mellitus without complications: Secondary | ICD-10-CM | POA: Insufficient documentation

## 2011-12-07 DIAGNOSIS — Z79899 Other long term (current) drug therapy: Secondary | ICD-10-CM | POA: Insufficient documentation

## 2011-12-07 DIAGNOSIS — E789 Disorder of lipoprotein metabolism, unspecified: Secondary | ICD-10-CM | POA: Insufficient documentation

## 2011-12-07 DIAGNOSIS — M25539 Pain in unspecified wrist: Secondary | ICD-10-CM | POA: Insufficient documentation

## 2011-12-07 DIAGNOSIS — I10 Essential (primary) hypertension: Secondary | ICD-10-CM | POA: Insufficient documentation

## 2011-12-07 DIAGNOSIS — M549 Dorsalgia, unspecified: Secondary | ICD-10-CM | POA: Insufficient documentation

## 2011-12-07 DIAGNOSIS — M109 Gout, unspecified: Secondary | ICD-10-CM | POA: Insufficient documentation

## 2011-12-07 MED ORDER — METHYLPREDNISOLONE SODIUM SUCC 125 MG IJ SOLR
125.0000 mg | Freq: Once | INTRAMUSCULAR | Status: AC
  Administered 2011-12-07: 125 mg via INTRAMUSCULAR
  Filled 2011-12-07: qty 2

## 2011-12-07 MED ORDER — METHYLPREDNISOLONE 4 MG PO KIT: PACK | ORAL | Status: DC

## 2011-12-07 NOTE — ED Notes (Signed)
Woke with pain in his left hand 2 days ago. Hx of gout and thought maybe he was having a new onset of gout. Started taking left over medication with no relief.

## 2011-12-07 NOTE — ED Provider Notes (Signed)
History   This chart was scribed for Isaac Chick, MD by Sofie Rower. The patient was seen in room MH03/MH03 and the patient's care was started at 3:01PM    CSN: [id number]  Arrival date & time 12/07/11  1347   First MD Initiated Contact with Patient 12/07/11 1501      Chief Complaint  Patient presents with  . Hand Pain    (Consider location/radiation/quality/duration/timing/severity/associated sxs/prior treatment) Patient is a 51 y.o. male presenting with hand pain. The history is provided by the patient. No language interpreter was used.  Hand Pain This is a new problem. The current episode started 2 days ago. The problem occurs constantly. The problem has been gradually worsening. Pertinent negatives include no chest pain, no abdominal pain, no headaches and no shortness of breath. The symptoms are aggravated by bending. Nothing relieves the symptoms. He has tried nothing for the symptoms. The treatment provided no relief.    Balke Mettert is a 51 y.o. male , with a hx of gout, who presents to the Emergency Department complaining of sudden, progressively worsening, hand pain located at the left hand onset two days ago with associated symptoms of swelling located at the left wrist. The pt reports he awoke on Saturday, 12/05/11, where he immediately felt a sore sensation within his left hand. Modifying factors include certain movements and positions of the left hand which intensify the pain, and taking allopurinol and indomethacin which provides moderate pain relief. The pt has a hx of allergy to aspirin.   The pt denies any fever associated with the hand pain.   The pt does not smoke or drink alcohol.   PCP is Dr. Kathrynn Speed.    Past Medical History  Diagnosis Date  . Hypertension   . Gout   . Diabetes mellitus   . High cholesterol   . Back pain     Past Surgical History  Procedure Date  . Back surgery     No family history on file.  History  Substance Use Topics  .  Smoking status: Former Smoker -- 0.5 packs/day  . Smokeless tobacco: Not on file  . Alcohol Use: No      Review of Systems  Respiratory: Negative for shortness of breath.   Cardiovascular: Negative for chest pain.  Gastrointestinal: Negative for abdominal pain.  Neurological: Negative for headaches.  All other systems reviewed and are negative.    .  Allergies  Aspirin  Home Medications   Current Outpatient Rx  Name Route Sig Dispense Refill  . ALBUTEROL SULFATE HFA 108 (90 BASE) MCG/ACT IN AERS Inhalation Inhale 1-2 puffs into the lungs every 6 (six) hours as needed for wheezing. 1 Inhaler 0  . ALLOPURINOL 100 MG PO TABS Oral Take 100 mg by mouth daily as needed. For gout    . FUROSEMIDE 20 MG PO TABS Oral Take 20 mg by mouth daily.     Marland Kitchen HYDROCODONE-ACETAMINOPHEN 5-500 MG PO TABS Oral Take 2 tablets by mouth every 6 (six) hours as needed. For pain     . HYDROMORPHONE HCL ER PO Oral Take by mouth.    . IBUPROFEN 800 MG PO TABS Oral Take 800 mg by mouth 2 (two) times daily.      . INDOMETHACIN 50 MG PO CAPS Oral Take 50 mg by mouth 3 (three) times daily as needed.      Marland Kitchen LABETALOL HCL 200 MG PO TABS Oral Take 200 mg by mouth 3 (three) times daily.      Marland Kitchen  LISINOPRIL 40 MG PO TABS Oral Take 40 mg by mouth daily.      Marland Kitchen METFORMIN HCL 500 MG PO TABS Oral Take 500 mg by mouth 2 (two) times daily with a meal.      . METHYLPREDNISOLONE 4 MG PO KIT  6, 5, 4, 3, 2, 1 po qD- taper 21 tablet 0  . SIMVASTATIN 40 MG PO TABS Oral Take 40 mg by mouth at bedtime.        BP 162/86  Pulse 86  Temp 98 F (36.7 C) (Oral)  Resp 18  Ht 6' (1.829 m)  Wt 320 lb (145.151 kg)  BMI 43.40 kg/m2  SpO2 98%  Physical Exam  Nursing note and vitals reviewed. Constitutional: He is oriented to person, place, and time. He appears well-developed and well-nourished.  HENT:  Head: Atraumatic.  Nose: Nose normal.  Mouth/Throat: Oropharynx is clear and moist.  Eyes: Conjunctivae normal and EOM are  normal.  Neck: Normal range of motion. Neck supple.  Cardiovascular: Normal heart sounds.   Pulmonary/Chest: Effort normal and breath sounds normal.  Musculoskeletal: Normal range of motion. He exhibits tenderness.       Tenderness to palpitation at the Dorsum of left wrist, no point tenderness, no swelling, no erythema , and  no warmth over the joint. Full ROM.   Neurological: He is alert and oriented to person, place, and time.  Skin: Skin is warm and dry.  Psychiatric: He has a normal mood and affect. His behavior is normal.    ED Course  Procedures (including critical care time)  DIAGNOSTIC STUDIES: Oxygen Saturation is 98% on room air, normal by my interpretation.    COORDINATION OF CARE:    3:15PM- Pain management and treatment of gout discussed with patient. Pt agrees with treatment.   Dg Hand Complete Left  12/07/2011  *RADIOLOGY REPORT*  Clinical Data: Left index finger pain radiating into the hand. History of gout.  LEFT HAND - COMPLETE 3+ VIEW  Comparison: None.  Findings: There are minimal arthritic changes of the DIP joints of the index and long fingers.  Osseous structures are otherwise normal.  No erosions.  No joint space narrowing.  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   Original Report Authenticated By: Gwynn Burly, M.D.       1. Wrist pain       MDM  Pt presents with wrist pain that is similar to his prior gout pain.  No fever,  No overlying erythema or warmth, low suspicion for septic arthritis.  Pt is requesting injection similar to what he received at last ED visit- records reviewed and solumedrol IM was given similar to previous.  Started on medrol dose pack.  Pt to continue taking indomethacin and allopurinol.  Discharged with strict return precautions.  Pt agreeable with plan.      I personally performed the services described in this documentation, which was scribed in my presence. The recorded  information has been reviewed and considered.    Isaac Chick, MD 12/07/11 2034

## 2011-12-14 ENCOUNTER — Encounter (HOSPITAL_BASED_OUTPATIENT_CLINIC_OR_DEPARTMENT_OTHER): Payer: Self-pay | Admitting: *Deleted

## 2011-12-14 ENCOUNTER — Emergency Department (HOSPITAL_BASED_OUTPATIENT_CLINIC_OR_DEPARTMENT_OTHER)
Admission: EM | Admit: 2011-12-14 | Discharge: 2011-12-14 | Disposition: A | Discharge status: Home / Self Care | Payer: BC Managed Care – PPO | Attending: Emergency Medicine | Admitting: Emergency Medicine

## 2011-12-14 DIAGNOSIS — Z888 Allergy status to other drugs, medicaments and biological substances status: Secondary | ICD-10-CM | POA: Insufficient documentation

## 2011-12-14 DIAGNOSIS — I10 Essential (primary) hypertension: Secondary | ICD-10-CM | POA: Insufficient documentation

## 2011-12-14 DIAGNOSIS — Z87891 Personal history of nicotine dependence: Secondary | ICD-10-CM | POA: Insufficient documentation

## 2011-12-14 DIAGNOSIS — M25539 Pain in unspecified wrist: Secondary | ICD-10-CM

## 2011-12-14 DIAGNOSIS — G5602 Carpal tunnel syndrome, left upper limb: Secondary | ICD-10-CM

## 2011-12-14 DIAGNOSIS — G5600 Carpal tunnel syndrome, unspecified upper limb: Secondary | ICD-10-CM | POA: Insufficient documentation

## 2011-12-14 DIAGNOSIS — E119 Type 2 diabetes mellitus without complications: Secondary | ICD-10-CM | POA: Insufficient documentation

## 2011-12-14 MED ORDER — TRAMADOL HCL 50 MG PO TABS: 50.0000 mg | ORAL_TABLET | Freq: Four times a day (QID) | ORAL | Status: DC | PRN

## 2011-12-14 NOTE — ED Provider Notes (Signed)
History     CSN: [id number]  Arrival date & time 12/14/11  4098   First MD Initiated Contact with Patient 12/14/11 [phone]      Chief Complaint  Patient presents with  . Wrist Pain    (Consider location/radiation/quality/duration/timing/severity/associated sxs/prior treatment) Patient is a 51 y.o. male presenting with wrist pain. The history is provided by the patient.  Wrist Pain This is a new problem. The current episode started more than 1 week ago. The problem occurs constantly. The problem has not changed since onset.Associated symptoms comments: Woke up one morning with left wrist pain that is not getting better.  No swelling, redness, or fever. The symptoms are aggravated by bending (clenching fist and completely extending the hand). The symptoms are relieved by NSAIDs. Treatments tried: ibuprofen. The treatment provided moderate relief.    Past Medical History  Diagnosis Date  . Hypertension   . Gout   . Diabetes mellitus   . High cholesterol   . Back pain     Past Surgical History  Procedure Date  . Back surgery     History reviewed. No pertinent family history.  History  Substance Use Topics  . Smoking status: Former Smoker -- 0.5 packs/day  . Smokeless tobacco: Not on file  . Alcohol Use: No      Review of Systems  All other systems reviewed and are negative.    Allergies  Aspirin  Home Medications   Current Outpatient Rx  Name Route Sig Dispense Refill  . ALBUTEROL SULFATE HFA 108 (90 BASE) MCG/ACT IN AERS Inhalation Inhale 1-2 puffs into the lungs every 6 (six) hours as needed for wheezing. 1 Inhaler 0  . ALLOPURINOL 100 MG PO TABS Oral Take 100 mg by mouth daily as needed. For gout    . FUROSEMIDE 20 MG PO TABS Oral Take 20 mg by mouth daily.     Marland Kitchen HYDROCODONE-ACETAMINOPHEN 5-500 MG PO TABS Oral Take 2 tablets by mouth every 6 (six) hours as needed. For pain     . HYDROMORPHONE HCL ER PO Oral Take by mouth.    . IBUPROFEN 800 MG PO TABS Oral  Take 800 mg by mouth 2 (two) times daily.      . INDOMETHACIN 50 MG PO CAPS Oral Take 50 mg by mouth 3 (three) times daily as needed.      Marland Kitchen LABETALOL HCL 200 MG PO TABS Oral Take 200 mg by mouth 3 (three) times daily.      Marland Kitchen LISINOPRIL 40 MG PO TABS Oral Take 40 mg by mouth daily.      Marland Kitchen METFORMIN HCL 500 MG PO TABS Oral Take 500 mg by mouth 2 (two) times daily with a meal.      . METHYLPREDNISOLONE 4 MG PO KIT  6, 5, 4, 3, 2, 1 po qD- taper 21 tablet 0  . SIMVASTATIN 40 MG PO TABS Oral Take 40 mg by mouth at bedtime.        BP 199/87  Pulse 71  Temp 98.6 F (37 C) (Oral)  Resp 16  SpO2 96%  Physical Exam  Nursing note and vitals reviewed. Constitutional: He is oriented to person, place, and time. He appears well-developed and well-nourished. No distress.  HENT:  Head: Normocephalic and atraumatic.  Mouth/Throat: Oropharynx is clear and moist.  Eyes: Conjunctivae normal and EOM are normal. Pupils are equal, round, and reactive to light.  Neck: Normal range of motion. Neck supple.  Musculoskeletal: Normal range of motion. He  exhibits no edema and no tenderness.       Left hand: He exhibits tenderness. He exhibits normal capillary refill and no swelling. normal sensation noted. Normal strength noted.       Hands: Neurological: He is alert and oriented to person, place, and time.  Skin: Skin is warm and dry. No rash noted. No erythema.  Psychiatric: He has a normal mood and affect. His behavior is normal.    ED Course  Procedures (including critical care time)  Labs Reviewed - No data to display No results found.   1. Wrist pain   2. Carpal tunnel syndrome of left wrist       MDM   Patient with exam findings and history most consistent with carpal tunnel. He is having pain over the median nerve of the left wrist that radiates up into his palms and is painful with fist clenching incomplete extension of the fingers. He has normal capillary refill, pulse and sensation. He  does state occasionally he gets tingling in his first through third fingers. He denies trauma and states he woke up that way about one week ago. He states when he sleeps he does have his hands under him at times. Patient was treated earlier this week for possible gout as patient does have a history however all the treatment that he normally takes for gout.Works has not changed his symptoms. On exam he has no signs of septic arthritis there is no erythema or notable swelling. Doubt gout or joint infection. Patient encouraged to continue wearing wrist splints especially at night and following up with hand surgery for unresolving symptoms       Gwyneth Sprout, MD 12/14/11 [phone]

## 2011-12-14 NOTE — ED Notes (Signed)
Continues to have pain in left wrist prior treatment here did not change his pain

## 2012-01-27 DIAGNOSIS — M79643 Pain in unspecified hand: Secondary | ICD-10-CM | POA: Insufficient documentation

## 2012-01-27 DIAGNOSIS — M549 Dorsalgia, unspecified: Secondary | ICD-10-CM | POA: Insufficient documentation

## 2012-01-27 DIAGNOSIS — M25539 Pain in unspecified wrist: Secondary | ICD-10-CM | POA: Insufficient documentation

## 2013-02-07 DIAGNOSIS — E559 Vitamin D deficiency, unspecified: Secondary | ICD-10-CM | POA: Insufficient documentation

## 2013-04-14 ENCOUNTER — Encounter (HOSPITAL_BASED_OUTPATIENT_CLINIC_OR_DEPARTMENT_OTHER): Payer: Self-pay | Admitting: Emergency Medicine

## 2013-04-14 ENCOUNTER — Emergency Department (HOSPITAL_BASED_OUTPATIENT_CLINIC_OR_DEPARTMENT_OTHER)
Admission: EM | Admit: 2013-04-14 | Discharge: 2013-04-14 | Disposition: A | Discharge status: Home / Self Care | Payer: BC Managed Care – PPO | Attending: Emergency Medicine | Admitting: Emergency Medicine

## 2013-04-14 DIAGNOSIS — J329 Chronic sinusitis, unspecified: Secondary | ICD-10-CM | POA: Insufficient documentation

## 2013-04-14 DIAGNOSIS — Z87891 Personal history of nicotine dependence: Secondary | ICD-10-CM | POA: Insufficient documentation

## 2013-04-14 DIAGNOSIS — H748X9 Other specified disorders of middle ear and mastoid, unspecified ear: Secondary | ICD-10-CM | POA: Insufficient documentation

## 2013-04-14 DIAGNOSIS — Z79899 Other long term (current) drug therapy: Secondary | ICD-10-CM | POA: Insufficient documentation

## 2013-04-14 DIAGNOSIS — E119 Type 2 diabetes mellitus without complications: Secondary | ICD-10-CM | POA: Insufficient documentation

## 2013-04-14 DIAGNOSIS — R52 Pain, unspecified: Secondary | ICD-10-CM | POA: Insufficient documentation

## 2013-04-14 DIAGNOSIS — R5381 Other malaise: Secondary | ICD-10-CM | POA: Insufficient documentation

## 2013-04-14 DIAGNOSIS — Z791 Long term (current) use of non-steroidal anti-inflammatories (NSAID): Secondary | ICD-10-CM | POA: Insufficient documentation

## 2013-04-14 DIAGNOSIS — Z8739 Personal history of other diseases of the musculoskeletal system and connective tissue: Secondary | ICD-10-CM | POA: Insufficient documentation

## 2013-04-14 DIAGNOSIS — I10 Essential (primary) hypertension: Secondary | ICD-10-CM | POA: Insufficient documentation

## 2013-04-14 DIAGNOSIS — R5383 Other fatigue: Secondary | ICD-10-CM

## 2013-04-14 DIAGNOSIS — E7800 Pure hypercholesterolemia, unspecified: Secondary | ICD-10-CM | POA: Insufficient documentation

## 2013-04-14 DIAGNOSIS — M109 Gout, unspecified: Secondary | ICD-10-CM | POA: Insufficient documentation

## 2013-04-14 MED ORDER — TRIAMCINOLONE ACETONIDE 55 MCG/ACT NA AERO: 2.0000 | INHALATION_SPRAY | Freq: Every day | NASAL | Status: DC

## 2013-04-14 MED ORDER — AMOXICILLIN 500 MG PO CAPS: 500.0000 mg | ORAL_CAPSULE | Freq: Three times a day (TID) | ORAL | Status: DC

## 2013-04-14 NOTE — ED Provider Notes (Signed)
CSN: [id number]     Arrival date & time 04/14/13  1650 History   First MD Initiated Contact with Patient 04/14/13 1732     Chief Complaint  Patient presents with  . URI   (Consider location/radiation/quality/duration/timing/severity/associated sxs/prior Treatment) The history is provided by the patient. No language interpreter was used.  Pt is a 53 year old male who presents with history of a URI and now he reports that he is having unilateral facial pressure and discomfort. He reports that he has been having sinus congestion for approx 1 month. He denies fever and is afebrile at today's visit. He reports associated general malaise and body aches. He denies headaches, neck pain or neck stiffness.   Past Medical History  Diagnosis Date  . Hypertension   . Gout   . Diabetes mellitus   . High cholesterol   . Back pain    Past Surgical History  Procedure Laterality Date  . Back surgery     No family history on file. History  Substance Use Topics  . Smoking status: Former Smoker -- 0.50 packs/day  . Smokeless tobacco: Not on file  . Alcohol Use: No    Review of Systems  Constitutional: Negative for fever and chills.  HENT: Positive for congestion and sinus pressure. Negative for facial swelling, sore throat and trouble swallowing.   Respiratory: Negative for cough and shortness of breath.   Musculoskeletal: Negative for arthralgias, back pain, joint swelling, neck pain and neck stiffness.  Neurological: Negative for facial asymmetry and weakness.    Allergies  Aspirin  Home Medications   Current Outpatient Rx  Name  Route  Sig  Dispense  Refill  . CLONIDINE HCL PO   Oral   Take by mouth.         Marland Kitchen HYDRALAZINE HCL PO   Oral   Take by mouth.         . EXPIRED: albuterol (PROVENTIL HFA;VENTOLIN HFA) 108 (90 BASE) MCG/ACT inhaler   Inhalation   Inhale 1-2 puffs into the lungs every 6 (six) hours as needed for wheezing.   1 Inhaler   0   . allopurinol (ZYLOPRIM)  100 MG tablet   Oral   Take 100 mg by mouth daily as needed. For gout         . furosemide (LASIX) 20 MG tablet   Oral   Take 20 mg by mouth daily.          Marland Kitchen HYDROcodone-acetaminophen (VICODIN) 5-500 MG per tablet   Oral   Take 2 tablets by mouth every 6 (six) hours as needed. For pain          . HYDROMORPHONE HCL ER PO   Oral   Take by mouth.         Marland Kitchen ibuprofen (ADVIL,MOTRIN) 800 MG tablet   Oral   Take 800 mg by mouth 2 (two) times daily.           . indomethacin (INDOCIN) 50 MG capsule   Oral   Take 50 mg by mouth 3 (three) times daily as needed.           . labetalol (NORMODYNE) 200 MG tablet   Oral   Take 200 mg by mouth 3 (three) times daily.           Marland Kitchen lisinopril (PRINIVIL,ZESTRIL) 40 MG tablet   Oral   Take 40 mg by mouth daily.           . metFORMIN (GLUCOPHAGE)  500 MG tablet   Oral   Take 500 mg by mouth 2 (two) times daily with a meal.           . methylPREDNISolone (MEDROL DOSEPAK) 4 MG tablet      6, 5, 4, 3, 2, 1 po qD- taper   21 tablet   0   . simvastatin (ZOCOR) 40 MG tablet   Oral   Take 40 mg by mouth at bedtime.           . traMADol (ULTRAM) 50 MG tablet   Oral   Take 1 tablet (50 mg total) by mouth every 6 (six) hours as needed for pain.   15 tablet   0    BP 189/88  Pulse 83  Temp(Src) 98.5 F (36.9 C) (Oral)  Resp 23  Ht 6' (1.829 m)  Wt 312 lb (141.522 kg)  BMI 42.31 kg/m2  SpO2 100% Physical Exam  Vitals reviewed. Constitutional: He is oriented to person, place, and time. He appears well-developed and well-nourished. No distress.  HENT:  Right Ear: A middle ear effusion is present.  Left Ear: Tympanic membrane normal.  Nose: Mucosal edema present.  Facial tenderness and feeling of facial pressure, right side of face.  Eyes: Conjunctivae and EOM are normal.  Neck: Normal range of motion. Neck supple. No JVD present. No tracheal deviation present. No thyromegaly present.  Cardiovascular: Normal rate,  regular rhythm, normal heart sounds and intact distal pulses.   Pulmonary/Chest: Effort normal and breath sounds normal. No respiratory distress. He has no wheezes.  Abdominal: Soft. Bowel sounds are normal.  Musculoskeletal: Normal range of motion.  Lymphadenopathy:    He has no cervical adenopathy.  Neurological: He is alert and oriented to person, place, and time. Coordination normal.  Ambulatory without any focal deficits or weakness.  Skin: Skin is warm and dry.  Psychiatric: He has a normal mood and affect. His behavior is normal. Judgment and thought content normal.    ED Course  Procedures (including critical care time) Labs Review Labs Reviewed - No data to display Imaging Review No results found.  EKG Interpretation   None       MDM   1. Sinusitis    History of URI, congestion and nasal stuffiness. Now with right sided facial pressure. Symptoms ongoing for approx one month. Exam and history consistent with sinusitis. Prescription of amoxicillin and nasacort aq.Discussed plan with pt and he agrees to take meds as directed and increase oral fluids. Return precautions given.       Irish EldersKelly Makaylia Hewett, NP 04/21/13 1246

## 2013-04-14 NOTE — ED Notes (Signed)
C/o chills, body aches, nausea, dizzy x 3 days-sinus congestion x 1 month

## 2013-04-14 NOTE — Discharge Instructions (Signed)
Upper Respiratory Infection, Adult An upper respiratory infection (URI) is also known as the common cold. It is often caused by a type of germ (virus). Colds are easily spread (contagious). You can pass it to others by kissing, coughing, sneezing, or drinking out of the same glass. Usually, you get better in 1 or 2 weeks.  HOME CARE   Only take medicine as told by your doctor.  Use a warm mist humidifier or breathe in steam from a hot shower.  Drink enough water and fluids to keep your pee (urine) clear or pale yellow.  Get plenty of rest.  Return to work when your temperature is back to normal or as told by your doctor. You may use a face mask and wash your hands to stop your cold from spreading. GET HELP RIGHT AWAY IF:   After the first few days, you feel you are getting worse.  You have questions about your medicine.  You have chills, shortness of breath, or brown or red spit (mucus).  You have yellow or brown snot (nasal discharge) or pain in the face, especially when you bend forward.  You have a fever, puffy (swollen) neck, pain when you swallow, or white spots in the back of your throat.  You have a bad headache, ear pain, sinus pain, or chest pain.  You have a high-pitched whistling sound when you breathe in and out (wheezing).  You have a lasting cough or cough up blood.  You have sore muscles or a stiff neck. MAKE SURE YOU:   Understand these instructions.  Will watch your condition.  Will get help right away if you are not doing well or get worse. Document Released: 08/12/2007 Document Revised: 05/18/2011 Document Reviewed: 06/30/2010 [facility] Patient Information 2014 [place], [place].   Return for worsening of symptoms or shortness of breath Take antibiotic as prescribed

## 2013-04-21 NOTE — ED Provider Notes (Signed)
Medical screening examination/treatment/procedure(s) were performed by non-physician practitioner and as supervising physician I was immediately available for consultation/collaboration.  Shanna Cisco, MD 04/21/13 2209

## 2013-05-09 DIAGNOSIS — IMO0002 Reserved for concepts with insufficient information to code with codable children: Secondary | ICD-10-CM | POA: Insufficient documentation

## 2013-05-09 DIAGNOSIS — E1149 Type 2 diabetes mellitus with other diabetic neurological complication: Secondary | ICD-10-CM | POA: Insufficient documentation

## 2013-05-09 DIAGNOSIS — J019 Acute sinusitis, unspecified: Secondary | ICD-10-CM | POA: Insufficient documentation

## 2013-05-09 DIAGNOSIS — E1151 Type 2 diabetes mellitus with diabetic peripheral angiopathy without gangrene: Secondary | ICD-10-CM | POA: Insufficient documentation

## 2013-05-09 DIAGNOSIS — E1169 Type 2 diabetes mellitus with other specified complication: Secondary | ICD-10-CM | POA: Insufficient documentation

## 2013-05-09 DIAGNOSIS — I110 Hypertensive heart disease with heart failure: Secondary | ICD-10-CM | POA: Insufficient documentation

## 2013-05-09 DIAGNOSIS — M26609 Unspecified temporomandibular joint disorder, unspecified side: Secondary | ICD-10-CM | POA: Insufficient documentation

## 2013-05-09 DIAGNOSIS — E1165 Type 2 diabetes mellitus with hyperglycemia: Secondary | ICD-10-CM

## 2014-12-31 ENCOUNTER — Other Ambulatory Visit: Payer: Self-pay

## 2014-12-31 ENCOUNTER — Emergency Department (HOSPITAL_COMMUNITY)
Admission: EM | Admit: 2014-12-31 | Discharge: 2015-01-01 | Disposition: A | Discharge status: Home / Self Care | Payer: Medicare HMO | Attending: Emergency Medicine | Admitting: Emergency Medicine

## 2014-12-31 ENCOUNTER — Emergency Department (HOSPITAL_COMMUNITY): Payer: Medicare HMO

## 2014-12-31 ENCOUNTER — Encounter (HOSPITAL_COMMUNITY): Payer: Self-pay | Admitting: *Deleted

## 2014-12-31 DIAGNOSIS — Z7951 Long term (current) use of inhaled steroids: Secondary | ICD-10-CM | POA: Diagnosis not present

## 2014-12-31 DIAGNOSIS — Z79899 Other long term (current) drug therapy: Secondary | ICD-10-CM | POA: Insufficient documentation

## 2014-12-31 DIAGNOSIS — E119 Type 2 diabetes mellitus without complications: Secondary | ICD-10-CM | POA: Diagnosis not present

## 2014-12-31 DIAGNOSIS — M109 Gout, unspecified: Secondary | ICD-10-CM | POA: Insufficient documentation

## 2014-12-31 DIAGNOSIS — I10 Essential (primary) hypertension: Secondary | ICD-10-CM | POA: Diagnosis not present

## 2014-12-31 DIAGNOSIS — E7800 Pure hypercholesterolemia, unspecified: Secondary | ICD-10-CM | POA: Insufficient documentation

## 2014-12-31 DIAGNOSIS — Z87891 Personal history of nicotine dependence: Secondary | ICD-10-CM | POA: Insufficient documentation

## 2014-12-31 DIAGNOSIS — I5020 Unspecified systolic (congestive) heart failure: Secondary | ICD-10-CM | POA: Diagnosis not present

## 2014-12-31 DIAGNOSIS — R0602 Shortness of breath: Secondary | ICD-10-CM | POA: Diagnosis present

## 2014-12-31 HISTORY — DX: Heart failure, unspecified: I50.9

## 2014-12-31 LAB — CBC WITH DIFFERENTIAL/PLATELET
BASOS ABS: 0 10*3/uL (ref 0.0–0.1)
Basophils Relative: 1 %
EOS ABS: 0.1 10*3/uL (ref 0.0–0.7)
Eosinophils Relative: 2 %
HCT: 32.6 % — ABNORMAL LOW (ref 39.0–52.0)
Hemoglobin: 10.3 g/dL — ABNORMAL LOW (ref 13.0–17.0)
Lymphocytes Relative: 24 %
Lymphs Abs: 1.4 10*3/uL (ref 0.7–4.0)
MCH: 26.9 pg (ref 26.0–34.0)
MCHC: 31.6 g/dL (ref 30.0–36.0)
MCV: 85.1 fL (ref 78.0–100.0)
MONOS PCT: 7 %
Monocytes Absolute: 0.4 10*3/uL (ref 0.1–1.0)
NEUTROS PCT: 66 %
Neutro Abs: 4 10*3/uL (ref 1.7–7.7)
Platelets: 306 10*3/uL (ref 150–400)
RBC: 3.83 MIL/uL — ABNORMAL LOW (ref 4.22–5.81)
RDW: 16.5 % — ABNORMAL HIGH (ref 11.5–15.5)
WBC: 6 10*3/uL (ref 4.0–10.5)

## 2014-12-31 LAB — D-DIMER, QUANTITATIVE (NOT AT ARMC): D DIMER QUANT: 0.76 ug{FEU}/mL — AB (ref 0.00–0.48)

## 2014-12-31 LAB — COMPREHENSIVE METABOLIC PANEL
ALT: 29 U/L (ref 17–63)
ANION GAP: 6 (ref 5–15)
AST: 25 U/L (ref 15–41)
Albumin: 3.9 g/dL (ref 3.5–5.0)
Alkaline Phosphatase: 57 U/L (ref 38–126)
BUN: 19 mg/dL (ref 6–20)
CO2: 26 mmol/L (ref 22–32)
Calcium: 8.9 mg/dL (ref 8.9–10.3)
Chloride: 108 mmol/L (ref 101–111)
Creatinine, Ser: 1.25 mg/dL — ABNORMAL HIGH (ref 0.61–1.24)
GFR calc Af Amer: >60 mL/min (ref >60)
GFR calc non Af Amer: >60 mL/min (ref >60)
GLUCOSE: 97 mg/dL (ref 65–99)
POTASSIUM: 3.6 mmol/L (ref 3.5–5.1)
SODIUM: 140 mmol/L (ref 135–145)
Total Bilirubin: 0.6 mg/dL (ref 0.3–1.2)
Total Protein: 7.6 g/dL (ref 6.5–8.1)

## 2014-12-31 LAB — I-STAT TROPONIN, ED: Troponin i, poc: 0.03 ng/mL (ref 0.00–0.08)

## 2014-12-31 LAB — BRAIN NATRIURETIC PEPTIDE: B Natriuretic Peptide: 456.7 pg/mL — ABNORMAL HIGH (ref 0.0–100.0)

## 2014-12-31 MED ORDER — FUROSEMIDE 10 MG/ML IJ SOLN
40.0000 mg | Freq: Once | INTRAMUSCULAR | Status: AC
  Administered 2014-12-31: 40 mg via INTRAVENOUS
  Filled 2014-12-31: qty 4

## 2014-12-31 MED ORDER — ALBUTEROL SULFATE (2.5 MG/3ML) 0.083% IN NEBU
5.0000 mg | INHALATION_SOLUTION | Freq: Once | RESPIRATORY_TRACT | Status: AC
  Administered 2014-12-31: 5 mg via RESPIRATORY_TRACT
  Filled 2014-12-31: qty 6

## 2014-12-31 MED ORDER — GI COCKTAIL ~~LOC~~
30.0000 mL | Freq: Once | ORAL | Status: AC
  Administered 2014-12-31: 30 mL via ORAL
  Filled 2014-12-31: qty 30

## 2014-12-31 MED ORDER — IOHEXOL 350 MG/ML SOLN
100.0000 mL | Freq: Once | INTRAVENOUS | Status: AC | PRN
Start: 2014-12-31 — End: 2015-01-01
  Administered 2015-01-01: 100 mL via INTRAVENOUS

## 2014-12-31 NOTE — ED Notes (Signed)
Pt states that he has felt short of breath over the last few days; pt states that it has gotten progressively worse over the last few days; pt states that it is worse with exertion; pt states that he also had GERD and has been having upper abd pain; pt states that he has an appointment with GI next week

## 2015-01-01 NOTE — ED Provider Notes (Signed)
CSN: [id number]     Arrival date & time 12/31/14  1921 History   First MD Initiated Contact with Patient 12/31/14 1950     Chief Complaint  Patient presents with  . Shortness of Breath     (Consider location/radiation/quality/duration/timing/severity/associated sxs/prior Treatment) Patient is a 54 y.o. male presenting with shortness of breath. The history is provided by the patient (the pt complains of sob).  Shortness of Breath Severity:  Moderate Onset quality:  Gradual Timing:  Constant Progression:  Waxing and waning Chronicity:  Recurrent Context: activity   Associated symptoms: no abdominal pain, no chest pain, no cough, no headaches and no rash     Past Medical History  Diagnosis Date  . Hypertension   . Gout   . Diabetes mellitus   . High cholesterol   . Back pain   . CHF (congestive heart failure) [facility])    Past Surgical History  Procedure Laterality Date  . Back surgery    . Mandible fracture surgery     No family history on file. Social History  Substance Use Topics  . Smoking status: Former Smoker -- 0.50 packs/day  . Smokeless tobacco: None  . Alcohol Use: Yes     Comment: rarely    Review of Systems  Constitutional: Negative for appetite change and fatigue.  HENT: Negative for congestion, ear discharge and sinus pressure.   Eyes: Negative for discharge.  Respiratory: Positive for shortness of breath. Negative for cough.   Cardiovascular: Negative for chest pain.  Gastrointestinal: Negative for abdominal pain and diarrhea.  Genitourinary: Negative for frequency and hematuria.  Musculoskeletal: Negative for back pain.  Skin: Negative for rash.  Neurological: Negative for seizures and headaches.  Psychiatric/Behavioral: Negative for hallucinations.      Allergies  Aspirin  Home Medications   Prior to Admission medications   Medication Sig Start Date End Date Taking? Authorizing Provider  allopurinol (ZYLOPRIM) 100 MG tablet Take 100 mg by  mouth daily as needed. For gout   Yes Historical Provider, MD  cloNIDine (CATAPRES) 0.1 MG tablet Take 0.1 mg by mouth daily.   Yes Historical Provider, MD  furosemide (LASIX) 20 MG tablet Take 20 mg by mouth daily.    Yes Historical Provider, MD  ibuprofen (ADVIL,MOTRIN) 200 MG tablet Take 800 mg by mouth every 6 (six) hours as needed for headache or moderate pain.   Yes Historical Provider, MD  ibuprofen (ADVIL,MOTRIN) 800 MG tablet Take 800 mg by mouth 2 (two) times daily as needed for headache or moderate pain.    Yes Historical Provider, MD  labetalol (NORMODYNE) 200 MG tablet Take 200 mg by mouth 3 (three) times daily.     Yes Historical Provider, MD  lisinopril (PRINIVIL,ZESTRIL) 40 MG tablet Take 40 mg by mouth daily.     Yes Historical Provider, MD  metFORMIN (GLUCOPHAGE) 500 MG tablet Take 250 mg by mouth 2 (two) times daily with a meal.    Yes Historical Provider, MD  oxyCODONE-acetaminophen (PERCOCET) 10-325 MG tablet Take 0.5 tablets by mouth daily as needed. 09/03/14  Yes Historical Provider, MD  promethazine (PHENERGAN) 25 MG tablet Take 25 tablets by mouth daily as needed. 05/17/12  Yes Historical Provider, MD  simvastatin (ZOCOR) 40 MG tablet Take 40 mg by mouth at bedtime.     Yes Historical Provider, MD  albuterol (PROVENTIL HFA;VENTOLIN HFA) 108 (90 BASE) MCG/ACT inhaler Inhale 1-2 puffs into the lungs every 6 (six) hours as needed for wheezing. 06/24/11 06/23/12  Vanetta Mulders,  MD  traMADol (ULTRAM) 50 MG tablet Take 1 tablet (50 mg total) by mouth every 6 (six) hours as needed for pain. 12/14/11   Gwyneth Sprout, MD  triamcinolone (NASACORT AQ) 55 MCG/ACT AERO nasal inhaler Place 2 sprays into the nose daily. 04/14/13   Irish Elders, NP   BP 183/85 mmHg  Pulse 51  Temp(Src) 98.5 F (36.9 C) (Oral)  Resp 22  SpO2 95% Physical Exam  Constitutional: He is oriented to person, place, and time. He appears well-developed.  HENT:  Head: Normocephalic.  Eyes: Conjunctivae and EOM  are normal. No scleral icterus.  Neck: Neck supple. No thyromegaly present.  Cardiovascular: Normal rate and regular rhythm.  Exam reveals no gallop and no friction rub.   No murmur heard. Pulmonary/Chest: No stridor. He has no wheezes. He has no rales. He exhibits no tenderness.  Abdominal: He exhibits no distension. There is no tenderness. There is no rebound.  Musculoskeletal: Normal range of motion. He exhibits no edema.  Lymphadenopathy:    He has no cervical adenopathy.  Neurological: He is oriented to person, place, and time. He exhibits normal muscle tone. Coordination normal.  Skin: No rash noted. No erythema.  Psychiatric: He has a normal mood and affect. His behavior is normal.    ED Course  Procedures (including critical care time) Labs Review Labs Reviewed  CBC WITH DIFFERENTIAL/PLATELET - Abnormal; Notable for the following:    RBC 3.83 (*)    Hemoglobin 10.3 (*)    HCT 32.6 (*)    RDW 16.5 (*)    All other components within normal limits  COMPREHENSIVE METABOLIC PANEL - Abnormal; Notable for the following:    Creatinine, Ser 1.25 (*)    All other components within normal limits  BRAIN NATRIURETIC PEPTIDE - Abnormal; Notable for the following:    B Natriuretic Peptide 456.7 (*)    All other components within normal limits  D-DIMER, QUANTITATIVE (NOT AT [facility]) - Abnormal; Notable for the following:    D-Dimer, Quant 0.76 (*)    All other components within normal limits  I-STAT TROPOININ, ED    Imaging Review Dg Chest 2 View  12/31/2014  CLINICAL DATA:  Shortness of breath for the past couple of days. EXAM: CHEST  2 VIEW COMPARISON:  09/20/2014 FINDINGS: Again noted are prominent central vascular structures. Heart size is mildly enlarged but unchanged. The trachea is midline. Degenerative changes in the thoracic spine. No large pleural effusions. IMPRESSION: Prominent central vascular structures may represent vascular congestion. No focal airspace disease. Stable  mild cardiomegaly. Electronically Signed   By: Richarda Overlie M.D.   On: 12/31/2014 20:15   I have personally reviewed and evaluated these images and lab results as part of my medical decision-making.   EKG Interpretation   Date/Time:  Monday December 31 2014 19:38:52 EDT Ventricular Rate:  89 PR Interval:  179 QRS Duration: 129 QT Interval:  432 QTC Calculation: 526 R Axis:   87 Text Interpretation:  Sinus rhythm IVCD, consider atypical RBBB ST elev,  probable normal early repol pattern Baseline wander in lead(s) III  Confirmed by Bevelyn Arriola  MD, Kamree Wiens [phone]) on 12/31/2014 11:27:50 PM      MDM   Final diagnoses:  None    Patient with congestive heart failure mild shortness breath. He improved with Lasix in the emergency room. He is to increase his Lasix to follow-up with his cardiologist next few days.    Bethann Berkshire, MD 01/07/15 1009

## 2015-01-01 NOTE — Discharge Instructions (Signed)
Increase your lasix (fluid pill) to 2 pills a day.   Follow up with your family md or cardiologist in 2-3 days.

## 2015-01-02 DIAGNOSIS — N182 Chronic kidney disease, stage 2 (mild): Secondary | ICD-10-CM | POA: Insufficient documentation

## 2015-01-02 DIAGNOSIS — E669 Obesity, unspecified: Secondary | ICD-10-CM | POA: Insufficient documentation

## 2015-01-02 DIAGNOSIS — G4733 Obstructive sleep apnea (adult) (pediatric): Secondary | ICD-10-CM | POA: Insufficient documentation

## 2015-01-02 DIAGNOSIS — I509 Heart failure, unspecified: Secondary | ICD-10-CM | POA: Insufficient documentation

## 2015-01-02 DIAGNOSIS — I4892 Unspecified atrial flutter: Secondary | ICD-10-CM | POA: Insufficient documentation

## 2015-01-02 DIAGNOSIS — Z9114 Patient's other noncompliance with medication regimen: Secondary | ICD-10-CM | POA: Insufficient documentation

## 2015-01-02 DIAGNOSIS — R0602 Shortness of breath: Secondary | ICD-10-CM | POA: Diagnosis present

## 2015-01-03 DIAGNOSIS — E876 Hypokalemia: Secondary | ICD-10-CM | POA: Insufficient documentation

## 2015-01-18 DIAGNOSIS — I429 Cardiomyopathy, unspecified: Secondary | ICD-10-CM | POA: Insufficient documentation

## 2015-01-18 DIAGNOSIS — E7800 Pure hypercholesterolemia, unspecified: Secondary | ICD-10-CM | POA: Diagnosis present

## 2015-01-18 DIAGNOSIS — IMO0002 Reserved for concepts with insufficient information to code with codable children: Secondary | ICD-10-CM | POA: Insufficient documentation

## 2015-01-18 DIAGNOSIS — I10 Essential (primary) hypertension: Secondary | ICD-10-CM | POA: Insufficient documentation

## 2015-04-08 DIAGNOSIS — S82851A Displaced trimalleolar fracture of right lower leg, initial encounter for closed fracture: Secondary | ICD-10-CM | POA: Insufficient documentation

## 2015-11-21 DIAGNOSIS — M5417 Radiculopathy, lumbosacral region: Secondary | ICD-10-CM | POA: Insufficient documentation

## 2016-03-04 ENCOUNTER — Observation Stay (HOSPITAL_BASED_OUTPATIENT_CLINIC_OR_DEPARTMENT_OTHER)
Admission: EM | Admit: 2016-03-04 | Discharge: 2016-03-06 | Disposition: A | Discharge status: Home / Self Care | Payer: Medicare HMO | Attending: Family Medicine | Admitting: Family Medicine

## 2016-03-04 ENCOUNTER — Emergency Department (HOSPITAL_BASED_OUTPATIENT_CLINIC_OR_DEPARTMENT_OTHER): Payer: Medicare HMO

## 2016-03-04 ENCOUNTER — Encounter (HOSPITAL_BASED_OUTPATIENT_CLINIC_OR_DEPARTMENT_OTHER): Payer: Self-pay | Admitting: *Deleted

## 2016-03-04 DIAGNOSIS — R0602 Shortness of breath: Secondary | ICD-10-CM | POA: Insufficient documentation

## 2016-03-04 DIAGNOSIS — I110 Hypertensive heart disease with heart failure: Secondary | ICD-10-CM | POA: Insufficient documentation

## 2016-03-04 DIAGNOSIS — I10 Essential (primary) hypertension: Secondary | ICD-10-CM

## 2016-03-04 DIAGNOSIS — Z9119 Patient's noncompliance with other medical treatment and regimen: Secondary | ICD-10-CM | POA: Insufficient documentation

## 2016-03-04 DIAGNOSIS — Z7984 Long term (current) use of oral hypoglycemic drugs: Secondary | ICD-10-CM | POA: Insufficient documentation

## 2016-03-04 DIAGNOSIS — M109 Gout, unspecified: Secondary | ICD-10-CM | POA: Diagnosis not present

## 2016-03-04 DIAGNOSIS — I080 Rheumatic disorders of both mitral and aortic valves: Secondary | ICD-10-CM | POA: Insufficient documentation

## 2016-03-04 DIAGNOSIS — Z9114 Patient's other noncompliance with medication regimen: Secondary | ICD-10-CM | POA: Insufficient documentation

## 2016-03-04 DIAGNOSIS — I2729 Other secondary pulmonary hypertension: Secondary | ICD-10-CM | POA: Diagnosis not present

## 2016-03-04 DIAGNOSIS — E7800 Pure hypercholesterolemia, unspecified: Secondary | ICD-10-CM | POA: Diagnosis not present

## 2016-03-04 DIAGNOSIS — I160 Hypertensive urgency: Secondary | ICD-10-CM | POA: Diagnosis not present

## 2016-03-04 DIAGNOSIS — I483 Typical atrial flutter: Secondary | ICD-10-CM | POA: Diagnosis not present

## 2016-03-04 DIAGNOSIS — Z8249 Family history of ischemic heart disease and other diseases of the circulatory system: Secondary | ICD-10-CM | POA: Diagnosis not present

## 2016-03-04 DIAGNOSIS — R072 Precordial pain: Secondary | ICD-10-CM | POA: Diagnosis not present

## 2016-03-04 DIAGNOSIS — R0789 Other chest pain: Secondary | ICD-10-CM | POA: Diagnosis present

## 2016-03-04 DIAGNOSIS — K219 Gastro-esophageal reflux disease without esophagitis: Secondary | ICD-10-CM | POA: Insufficient documentation

## 2016-03-04 DIAGNOSIS — R079 Chest pain, unspecified: Secondary | ICD-10-CM | POA: Diagnosis not present

## 2016-03-04 DIAGNOSIS — I4892 Unspecified atrial flutter: Secondary | ICD-10-CM | POA: Diagnosis not present

## 2016-03-04 DIAGNOSIS — I429 Cardiomyopathy, unspecified: Secondary | ICD-10-CM | POA: Insufficient documentation

## 2016-03-04 DIAGNOSIS — I5022 Chronic systolic (congestive) heart failure: Secondary | ICD-10-CM | POA: Diagnosis present

## 2016-03-04 DIAGNOSIS — Z886 Allergy status to analgesic agent status: Secondary | ICD-10-CM | POA: Insufficient documentation

## 2016-03-04 DIAGNOSIS — I5042 Chronic combined systolic (congestive) and diastolic (congestive) heart failure: Secondary | ICD-10-CM | POA: Insufficient documentation

## 2016-03-04 DIAGNOSIS — E785 Hyperlipidemia, unspecified: Secondary | ICD-10-CM | POA: Diagnosis not present

## 2016-03-04 DIAGNOSIS — G4733 Obstructive sleep apnea (adult) (pediatric): Secondary | ICD-10-CM | POA: Diagnosis not present

## 2016-03-04 DIAGNOSIS — Z87891 Personal history of nicotine dependence: Secondary | ICD-10-CM | POA: Diagnosis not present

## 2016-03-04 DIAGNOSIS — E8881 Metabolic syndrome: Secondary | ICD-10-CM | POA: Diagnosis not present

## 2016-03-04 DIAGNOSIS — E119 Type 2 diabetes mellitus without complications: Secondary | ICD-10-CM | POA: Diagnosis not present

## 2016-03-04 DIAGNOSIS — Z6841 Body Mass Index (BMI) 40.0 and over, adult: Secondary | ICD-10-CM | POA: Insufficient documentation

## 2016-03-04 LAB — COMPREHENSIVE METABOLIC PANEL
ALT: 32 U/L (ref 17–63)
AST: 42 U/L — ABNORMAL HIGH (ref 15–41)
Albumin: 4.2 g/dL (ref 3.5–5.0)
Alkaline Phosphatase: 59 U/L (ref 38–126)
Anion gap: 11 (ref 5–15)
BILIRUBIN TOTAL: 0.5 mg/dL (ref 0.3–1.2)
BUN: 14 mg/dL (ref 6–20)
CO2: 22 mmol/L (ref 22–32)
Calcium: 9.3 mg/dL (ref 8.9–10.3)
Chloride: 107 mmol/L (ref 101–111)
Creatinine, Ser: 1.09 mg/dL (ref 0.61–1.24)
GFR calc Af Amer: >60 mL/min (ref >60)
GFR calc non Af Amer: >60 mL/min (ref >60)
Glucose, Bld: 98 mg/dL (ref 65–99)
Potassium: 3.9 mmol/L (ref 3.5–5.1)
Sodium: 140 mmol/L (ref 135–145)
TOTAL PROTEIN: 8.4 g/dL — AB (ref 6.5–8.1)

## 2016-03-04 LAB — CBC WITH DIFFERENTIAL/PLATELET
BASOS PCT: 1 %
Basophils Absolute: 0.1 10*3/uL (ref 0.0–0.1)
Eosinophils Absolute: 0.1 10*3/uL (ref 0.0–0.7)
Eosinophils Relative: 1 %
HEMATOCRIT: 36.1 % — AB (ref 39.0–52.0)
HEMOGLOBIN: 12 g/dL — AB (ref 13.0–17.0)
Lymphocytes Relative: 22 %
Lymphs Abs: 1.3 10*3/uL (ref 0.7–4.0)
MCH: 30.1 pg (ref 26.0–34.0)
MCHC: 33.2 g/dL (ref 30.0–36.0)
MCV: 90.5 fL (ref 78.0–100.0)
MONOS PCT: 9 %
Monocytes Absolute: 0.5 10*3/uL (ref 0.1–1.0)
NEUTROS ABS: 3.8 10*3/uL (ref 1.7–7.7)
NEUTROS PCT: 67 %
Platelets: 286 10*3/uL (ref 150–400)
RBC: 3.99 MIL/uL — ABNORMAL LOW (ref 4.22–5.81)
RDW: 13.9 % (ref 11.5–15.5)
WBC: 5.7 10*3/uL (ref 4.0–10.5)

## 2016-03-04 LAB — BRAIN NATRIURETIC PEPTIDE: B NATRIURETIC PEPTIDE 5: 317.5 pg/mL — AB (ref 0.0–100.0)

## 2016-03-04 LAB — TROPONIN I: Troponin I: 0.05 ng/mL (ref <0.03)

## 2016-03-04 MED ORDER — LABETALOL HCL 5 MG/ML IV SOLN
20.0000 mg | Freq: Once | INTRAVENOUS | Status: AC
  Administered 2016-03-04: 20 mg via INTRAVENOUS
  Filled 2016-03-04: qty 4

## 2016-03-04 MED ORDER — METOPROLOL TARTRATE 5 MG/5ML IV SOLN
5.0000 mg | Freq: Once | INTRAVENOUS | Status: AC
  Administered 2016-03-04: 5 mg via INTRAVENOUS
  Filled 2016-03-04: qty 5

## 2016-03-04 MED ORDER — DILTIAZEM LOAD VIA INFUSION
20.0000 mg | Freq: Once | INTRAVENOUS | Status: DC
  Filled 2016-03-04: qty 20

## 2016-03-04 MED ORDER — DEXTROSE 5 % IV SOLN: 5.0000 mg/h | INTRAVENOUS | Status: DC

## 2016-03-04 MED ORDER — METOPROLOL TARTRATE 5 MG/5ML IV SOLN
5.0000 mg | Freq: Once | INTRAVENOUS | Status: AC
  Administered 2016-03-04: 5 mg via INTRAVENOUS

## 2016-03-04 MED ORDER — HEPARIN (PORCINE) IN NACL 100-0.45 UNIT/ML-% IJ SOLN
1500.0000 [IU]/h | INTRAMUSCULAR | Status: AC
  Administered 2016-03-04 – 2016-03-05 (×2): 1500 [IU]/h via INTRAVENOUS
  Filled 2016-03-04 (×2): qty 250

## 2016-03-04 MED ORDER — HEPARIN BOLUS VIA INFUSION
5000.0000 [IU] | Freq: Once | INTRAVENOUS | Status: AC
  Administered 2016-03-04: 5000 [IU] via INTRAVENOUS

## 2016-03-04 MED ORDER — METOPROLOL TARTRATE 5 MG/5ML IV SOLN
INTRAVENOUS | Status: AC
  Filled 2016-03-04: qty 5

## 2016-03-04 NOTE — Progress Notes (Signed)
ANTICOAGULATION CONSULT NOTE - Initial Consult  Pharmacy Consult for heparin Indication: atrial fibrillation  Allergies  Allergen Reactions  . Aspirin Anaphylaxis    "indigestion"    Patient Measurements: Height: 5\' 11"  (180.3 cm) Weight: (!) 302 lb (137 kg) IBW/kg (Calculated) : 75.3 Heparin Dosing Weight: 107kg  Vital Signs: Temp: 98.2 F (36.8 C) (12/27 1748) BP: 200/97 (12/27 2046) Pulse Rate: 108 (12/27 2046)  Labs:  Recent Labs  03/04/16 1819  HGB 12.0*  HCT 36.1*  PLT 286  CREATININE 1.09  TROPONINI 0.05*    Estimated Creatinine Clearance: 110.9 mL/min (by C-G formula based on SCr of 1.09 mg/dL).   Medical History: Past Medical History:  Diagnosis Date  . Back pain   . CHF (congestive heart failure) (HCC)   . Diabetes mellitus   . Gout   . High cholesterol   . Hypertension     Medications:  Infusions:  . heparin      Assessment: 55 yom presented to the ED with SOB and CP. To start heparin IV for afib. He is not on anticoagulation PTA. Baseline H/H is slightly low but platelets are WNL.   Goal of Therapy:  Heparin level 0.3-0.7 units/ml Monitor platelets by anticoagulation protocol: Yes   Plan:  Heparin bolus 5000 units IV x 1 Heparin gtt 1500 units/hr Check a 6 hr heparin level Daily heparin level and CBC  Anona Giovannini, Drake Leach 03/04/2016,9:16 PM

## 2016-03-04 NOTE — Progress Notes (Signed)
Received patient from [employer] via [facility].  Patient AOx4, ambulatory, still complaining of acid reflux, asked for cold water, still elevated BP, admissions notified and Dr. Clyde Lundborg paged by admission.  Put on telemtery box 2W29. Patient oriented to room, bed controls and call light.  Awaiting for orders.

## 2016-03-04 NOTE — H&P (Signed)
History and Physical    Isaac Valdez LNL:[id number] DOB: 13-Dec-1960 DOA: 03/04/2016  Referring MD/NP/PA:   PCP: Karle Plumber, MD   Patient coming from:  The patient is coming from home.  At baseline, pt is independent for most of ADL.   Chief Complaint: Chest pain   HPI: Isaac Valdez is a 55 y.o. male with medical history significant of hypertension, hyperlipidemia, diabetes mellitus, gout, sCHF, A flutter not on anticoagulants, back pain, who presents with chest pain.  Patient states that his chest pain started in this morning. It is located in the substernal area, intermittently, 9 out of 10 in severity, pressure-like pain, nonradiating. It is exertional. Patient does not have cough, fever or chills. He has mild shortness of breath. Patient has nausea, and vomited once. No diarrhea or abdominal pain. Denies symptoms of UTI or unilateral weakness. His blood pressure is significantly elevated at 216/172 on admission. Patient states that he missed some doses of blood pressure medications sometimes.  ED Course: pt was found to have troponin 0.05, flutter on EKG, BNP 317, electrolytes and renal function okay, negative chest x-ray, temperature normal, oxygen saturation 99% on room air. Patient is placed on telemetry bed for observation.  Review of Systems:   General: no fevers, chills, no changes in body weight, has fatigue HEENT: no blurry vision, hearing changes or sore throat Respiratory: has dyspnea, no coughing, wheezing CV: has chest pain, no palpitations GI: has nausea, vomiting, no abdominal pain, diarrhea, constipation GU: no dysuria, burning on urination, increased urinary frequency, hematuria  Ext: has mild leg edema Neuro: no unilateral weakness, numbness, or tingling, no vision change or hearing loss Skin: no rash, no skin tear. MSK: No muscle spasm, no deformity, no limitation of range of movement in spin Heme: No easy bruising.  Travel history: No recent long  distant travel.  Allergy:  Allergies  Allergen Reactions  . Aspirin Anaphylaxis    "indigestion"    Past Medical History:  Diagnosis Date  . Back pain   . CHF (congestive heart failure) (HCC)   . Diabetes mellitus   . Gout   . High cholesterol   . Hypertension     Past Surgical History:  Procedure Laterality Date  . BACK SURGERY    . MANDIBLE FRACTURE SURGERY      Social History:  reports that he has quit smoking. He smoked 0.50 packs per day. He does not have any smokeless tobacco history on file. He reports that he drinks alcohol. He reports that he does not use drugs.  Family History:  Family History  Problem Relation Age of Onset  . Heart disease Mother   . Hypertension Sister      Prior to Admission medications   Medication Sig Start Date End Date Taking? Authorizing Provider  albuterol (PROVENTIL HFA;VENTOLIN HFA) 108 (90 BASE) MCG/ACT inhaler Inhale 1-2 puffs into the lungs every 6 (six) hours as needed for wheezing. 06/24/11 06/23/12  Vanetta Mulders, MD  allopurinol (ZYLOPRIM) 100 MG tablet Take 100 mg by mouth daily as needed. For gout    Historical Provider, MD  cloNIDine (CATAPRES) 0.1 MG tablet Take 0.1 mg by mouth daily.    Historical Provider, MD  furosemide (LASIX) 20 MG tablet Take 20 mg by mouth daily.     Historical Provider, MD  ibuprofen (ADVIL,MOTRIN) 200 MG tablet Take 800 mg by mouth every 6 (six) hours as needed for headache or moderate pain.    Historical Provider, MD  ibuprofen (ADVIL,MOTRIN) 800 MG  tablet Take 800 mg by mouth 2 (two) times daily as needed for headache or moderate pain.     Historical Provider, MD  labetalol (NORMODYNE) 200 MG tablet Take 200 mg by mouth 3 (three) times daily.      Historical Provider, MD  lisinopril (PRINIVIL,ZESTRIL) 40 MG tablet Take 40 mg by mouth daily.      Historical Provider, MD  metFORMIN (GLUCOPHAGE) 500 MG tablet Take 250 mg by mouth 2 (two) times daily with a meal.     Historical Provider, MD    oxyCODONE-acetaminophen (PERCOCET) 10-325 MG tablet Take 0.5 tablets by mouth daily as needed. 09/03/14   Historical Provider, MD  promethazine (PHENERGAN) 25 MG tablet Take 25 tablets by mouth daily as needed. 05/17/12   Historical Provider, MD  simvastatin (ZOCOR) 40 MG tablet Take 40 mg by mouth at bedtime.      Historical Provider, MD  traMADol (ULTRAM) 50 MG tablet Take 1 tablet (50 mg total) by mouth every 6 (six) hours as needed for pain. 12/14/11   Gwyneth SproutWhitney Plunkett, MD  triamcinolone (NASACORT AQ) 55 MCG/ACT AERO nasal inhaler Place 2 sprays into the nose daily. 04/14/13   Irish EldersKelly Walker, FNP    Physical Exam: Vitals:   03/04/16 2315 03/05/16 0200 03/05/16 0341 03/05/16 0439  BP: (!) 183/122 (!) 207/106 (!) 163/84 (!) 163/87  Pulse: 66 (!) 101 95 95  Resp: 18 18 18 18   Temp: 97.8 F (36.6 C)   98.3 F (36.8 C)  TempSrc: Oral   Oral  SpO2: 100% 100% 100% 97%  Weight: 135.7 kg (299 lb 1.6 oz)     Height: 5\' 11"  (1.803 m)      General: Not in acute distress HEENT:       Eyes: PERRL, EOMI, no scleral icterus.       ENT: No discharge from the ears and nose, no pharynx injection, no tonsillar enlargement.        Neck: No JVD, no bruit, no mass felt. Heme: No neck lymph node enlargement. Cardiac: S1/S2, RRR, No murmurs, No gallops or rubs. Respiratory: No rales, wheezing, rhonchi or rubs. GI: Soft, nondistended, nontender, no rebound pain, no organomegaly, BS present. GU: No hematuria Ext: has trace leg edema bilaterally. 2+DP/PT pulse bilaterally. Musculoskeletal: No joint deformities, No joint redness or warmth, no limitation of ROM in spin. Skin: No rashes.  Neuro: Alert, oriented X3, cranial nerves II-XII grossly intact, moves all extremities normally.  Psych: Patient is not psychotic, no suicidal or hemocidal ideation.  Labs on Admission: I have personally reviewed following labs and imaging studies  CBC:  Recent Labs Lab 03/04/16 1819 03/05/16 0127  WBC 5.7 8.5   NEUTROABS 3.8  --   HGB 12.0* 11.7*  HCT 36.1* 35.7*  MCV 90.5 89.5  PLT 286 291   Basic Metabolic Panel:  Recent Labs Lab 03/04/16 1819  NA 140  K 3.9  CL 107  CO2 22  GLUCOSE 98  BUN 14  CREATININE 1.09  CALCIUM 9.3   GFR: Estimated Creatinine Clearance: 110.3 mL/min (by C-G formula based on SCr of 1.09 mg/dL). Liver Function Tests:  Recent Labs Lab 03/04/16 1819  AST 42*  ALT 32  ALKPHOS 59  BILITOT 0.5  PROT 8.4*  ALBUMIN 4.2   No results for input(s): LIPASE, AMYLASE in the last 168 hours. No results for input(s): AMMONIA in the last 168 hours. Coagulation Profile: No results for input(s): INR, PROTIME in the last 168 hours. Cardiac Enzymes:  Recent  Labs Lab 03/04/16 1819 03/05/16 0127  TROPONINI 0.05* 0.05*   BNP (last 3 results) No results for input(s): PROBNP in the last 8760 hours. HbA1C: No results for input(s): HGBA1C in the last 72 hours. CBG:  Recent Labs Lab 03/05/16 0202  GLUCAP 108*   Lipid Profile:  Recent Labs  03/05/16 0348  CHOL 215*  HDL 58  LDLCALC 109*  TRIG 241*  CHOLHDL 3.7   Thyroid Function Tests:  Recent Labs  03/05/16 0348  TSH 2.190   Anemia Panel: No results for input(s): VITAMINB12, FOLATE, FERRITIN, TIBC, IRON, RETICCTPCT in the last 72 hours. Urine analysis: No results found for: COLORURINE, APPEARANCEUR, LABSPEC, PHURINE, GLUCOSEU, HGBUR, BILIRUBINUR, KETONESUR, PROTEINUR, UROBILINOGEN, NITRITE, LEUKOCYTESUR Sepsis Labs: @LABRCNTIP (procalcitonin:4,lacticidven:4) )No results found for this or any previous visit (from the past 240 hour(s)).   Radiological Exams on Admission: Dg Chest 2 View  Result Date: 03/04/2016 CLINICAL DATA:  Mid day chest pain x1 day. EXAM: CHEST  2 VIEW COMPARISON:  07/08/2015 CXR FINDINGS: The heart size and mediastinal contours are within normal limits. Both lungs are clear with some minimal atelectasis at each lung base. The visualized skeletal structures are  unremarkable. IMPRESSION: No active cardiopulmonary disease. Electronically Signed   By: Tollie Eth M.D.   On: 03/04/2016 18:06     EKG: Independently reviewed.  A flutter, QTC 539, poor R wave progression  Assessment/Plan Principal Problem:   Chest pain Active Problems:   Atrial flutter (HCC)   High cholesterol   Hypertensive urgency   Chronic systolic heart failure (HCC)   Chest pain: Troponin slightly elevated at 0.05. Likely due to demand ischemia secondary to hypertensive urgency.  - will place on Tele bed for obs - control Bp as below - cycle CE q6 x3 and repeat her EKG in the am  - Nitroglycerin, Morphine, and zocor - pt is allergic to aspirin - Risk factor stratification: will check FLP, UDS and A1C  - 2d echo - please call Card in AM  Hypertensive urgency: Blood pressure is 216/172 on admission, which improved to 163/87 now.  -IV hydralazine when necessary -Give Lasix 40 mg 1 -Increase clonidine dose from 0.1 twice a day to 3 times a day -Continue labetalol and lisinopril  Atrial flutter [facility]): Patient states that he had atrial flutter in the past. He was on amiodarone, which was discontinued due to possible development of coronary hypertension per patient. His atrial flutter had resolved per patient. Now he has recurrent atrial flutter. CHA2DS2-VASc Score is 3, needs oral anticoagulation. -will start IV heparin  -on labetalol -need to consult to Card for possible conversion  HLD: Last LDL was not on record -Continue home medications: Zocor -Check FLP  Chronic systolic heart failure (HCC): Per report, his recent EF was 40-45 percent. Patient has trace leg edema, but no JVD. CHF seems to be compensated. BNP 317, indicating patient is at risk of developing CHF exacerbation -Will give 1 dose of Lasix 40 mg 1 -continue home dose of lasix 20 mg daily  DVT ppx: On IV Heparin Code Status: Full code Family Communication: Yes, patient's wife at bed side Disposition  Plan:  Anticipate discharge back to previous home environment Consults called: none  Admission status: Obs / tele     Date of Service 03/05/2016    Lorretta Harp [facility] Pager [phone]  If 7PM-7AM, please contact night-coverage [email or web address] Password [email or web address] 03/05/2016, 6:01 AM

## 2016-03-04 NOTE — Progress Notes (Signed)
Patient accepted for observation status with telemetry monitoring for evaluation of chest pain, shortness of breath, and recurrent atrial flutter (prior history, previously weaned off amiodarone, currently NOT anticoagulated).  Cardiac risk factors include HTN, elevated cholesterol, DM, and he has a history of CHF (EF 40-45%, ?nonischemic).  EKG shows atrial flutter.  First troponin 0.05.  Systolic blood pressures elevated 190-200.  He has received IV lopressor x 2 (total of 10 mg) and labetalol 20mg  IV x one.  His cardiologist is in [facility] but this facility is full, so he is being transferred here.  ED attending spoke to cardiology on call.  Plan is to continue to manage rate with oral lopressor for now and we will anticoagulate with heparin infusion per protocol.

## 2016-03-04 NOTE — ED Provider Notes (Signed)
[facility] Provider Note   CSN: [id number] Arrival date & time: 03/04/16  1734  By signing my name below, I, Bing NeighborsMaurice Deon Copeland Jr., attest that this documentation has been prepared under the direction and in the presence of Lyndal Pulleyaniel Leyna Vanderkolk, MD. Electronically signed: Bing NeighborsMaurice Deon Copeland Jr., ED Scribe. 03/04/16. 6:43 PM.    History   Chief Complaint Chief Complaint  Patient presents with  . Chest Pain    HPI  HPI Comments: Isaac Valdez is a 55 y.o. male with h/o HTN, arhythmia, CHF and diabetes mellitus who presents to the Emergency Department complaining of intermittent chest pain with sudden onset x1 day. Pt states that he has had a bad case of indigestion that has worsened today but has been intermittent for the past 6 months. Pt states that the pain is localized to the substernal chest and describes the pain as pressure. Pt reports associated lightheadedness, SOB. hx of this pain Pt is seen at [facility] cardiology. Pt denies associated leg swelling, blood thinner use, the pain being exacerbated with ambulation.    The history is provided by the patient. No language interpreter was used.    Past Medical History:  Diagnosis Date  . Back pain   . CHF (congestive heart failure) (HCC)   . Diabetes mellitus   . Gout   . High cholesterol   . Hypertension     There are no active problems to display for this patient.   Past Surgical History:  Procedure Laterality Date  . BACK SURGERY    . MANDIBLE FRACTURE SURGERY         Home Medications    Prior to Admission medications   Medication Sig Start Date End Date Taking? Authorizing Provider  albuterol (PROVENTIL HFA;VENTOLIN HFA) 108 (90 BASE) MCG/ACT inhaler Inhale 1-2 puffs into the lungs every 6 (six) hours as needed for wheezing. 06/24/11 06/23/12  Vanetta MuldersScott Zackowski, MD  allopurinol (ZYLOPRIM) 100 MG tablet Take 100 mg by mouth daily as needed. For gout    Historical Provider, MD  cloNIDine  (CATAPRES) 0.1 MG tablet Take 0.1 mg by mouth daily.    Historical Provider, MD  furosemide (LASIX) 20 MG tablet Take 20 mg by mouth daily.     Historical Provider, MD  ibuprofen (ADVIL,MOTRIN) 200 MG tablet Take 800 mg by mouth every 6 (six) hours as needed for headache or moderate pain.    Historical Provider, MD  ibuprofen (ADVIL,MOTRIN) 800 MG tablet Take 800 mg by mouth 2 (two) times daily as needed for headache or moderate pain.     Historical Provider, MD  labetalol (NORMODYNE) 200 MG tablet Take 200 mg by mouth 3 (three) times daily.      Historical Provider, MD  lisinopril (PRINIVIL,ZESTRIL) 40 MG tablet Take 40 mg by mouth daily.      Historical Provider, MD  metFORMIN (GLUCOPHAGE) 500 MG tablet Take 250 mg by mouth 2 (two) times daily with a meal.     Historical Provider, MD  oxyCODONE-acetaminophen (PERCOCET) 10-325 MG tablet Take 0.5 tablets by mouth daily as needed. 09/03/14   Historical Provider, MD  promethazine (PHENERGAN) 25 MG tablet Take 25 tablets by mouth daily as needed. 05/17/12   Historical Provider, MD  simvastatin (ZOCOR) 40 MG tablet Take 40 mg by mouth at bedtime.      Historical Provider, MD  traMADol (ULTRAM) 50 MG tablet Take 1 tablet (50 mg total) by mouth every 6 (six) hours as needed for pain. 12/14/11  Gwyneth Sprout, MD  triamcinolone (NASACORT AQ) 55 MCG/ACT AERO nasal inhaler Place 2 sprays into the nose daily. 04/14/13   Irish Elders, FNP    Family History History reviewed. No pertinent family history.  Social History Social History  Substance Use Topics  . Smoking status: Former Smoker    Packs/day: 0.50  . Smokeless tobacco: Not on file  . Alcohol use Yes     Comment: rarely     Allergies   Aspirin   Review of Systems Review of Systems  Constitutional: Negative for chills and fever.  HENT: Negative for ear pain and sore throat.   Eyes: Negative for pain and visual disturbance.  Respiratory: Negative for cough and shortness of breath.     Cardiovascular: Positive for chest pain. Negative for palpitations and leg swelling.  Gastrointestinal: Negative for abdominal pain and vomiting.  Genitourinary: Negative for dysuria and hematuria.  Musculoskeletal: Negative for arthralgias and back pain.  Skin: Negative for color change and rash.  Neurological: Positive for light-headedness. Negative for seizures and syncope.  All other systems reviewed and are negative.    Physical Exam Updated Vital Signs BP 196/97   Pulse 60   Temp 98.2 F (36.8 C)   Resp 20   Ht 5\' 11"  (1.803 m)   Wt (!) 302 lb (137 kg)   SpO2 99%   BMI 42.12 kg/m   Physical Exam  Constitutional: He is oriented to person, place, and time. He appears well-developed and well-nourished. No distress.  HENT:  Head: Normocephalic and atraumatic.  Nose: Nose normal.  Eyes: Conjunctivae are normal.  Neck: Neck supple. No tracheal deviation present.  Cardiovascular: A regularly irregular rhythm present. Tachycardia present.   Pulmonary/Chest: Effort normal. No respiratory distress.  Abdominal: Soft. He exhibits no distension.  Neurological: He is alert and oriented to person, place, and time.  Skin: Skin is warm and dry.  Psychiatric: He has a normal mood and affect.     ED Treatments / Results   DIAGNOSTIC STUDIES: Oxygen Saturation is 99% on RA, normal by my interpretation.   COORDINATION OF CARE: 6:46 PM-Discussed next steps with pt. Pt verbalized understanding and is agreeable with the plan.    Labs (all labs ordered are listed, but only abnormal results are displayed) Labs Reviewed  TROPONIN I - Abnormal; Notable for the following:       Result Value   Troponin I 0.05 (*)    All other components within normal limits  CBC WITH DIFFERENTIAL/PLATELET - Abnormal; Notable for the following:    RBC 3.99 (*)    Hemoglobin 12.0 (*)    HCT 36.1 (*)    All other components within normal limits  COMPREHENSIVE METABOLIC PANEL - Abnormal; Notable  for the following:    Total Protein 8.4 (*)    AST 42 (*)    All other components within normal limits  BRAIN NATRIURETIC PEPTIDE - Abnormal; Notable for the following:    B Natriuretic Peptide 317.5 (*)    All other components within normal limits  HEPARIN LEVEL (UNFRACTIONATED)  CBC    EKG  EKG Interpretation  Date/Time:  Wednesday March 04 2016 17:52:45 EST Ventricular Rate:  117 PR Interval:    QRS Duration: 100 QT Interval:  398 QTC Calculation: 555 R Axis:   64 Text Interpretation:  sinus tachycardia with frequent PACs in a pattern of bigeminy versus atrial flutter Abnormal ECG Reconfirmed by Vance Hochmuth MD, Shatasia Cutshaw ([id number]) on 03/04/2016 8:14:07 PM  EKG Interpretation  Date/Time:  Wednesday March 04 2016 19:53:43 EST Ventricular Rate:  99 PR Interval:    QRS Duration: 98 QT Interval:  420 QTC Calculation: 539 R Axis:   66 Text Interpretation:  Atrial flutter Anteroseptal infarct, old Since last tracing rate slower Otherwise no significant change Confirmed by Kaleyah Labreck MD, Kortney Potvin [phone]) on 03/04/2016 8:15:26 PM        Radiology Dg Chest 2 View  Result Date: 03/04/2016 CLINICAL DATA:  Mid day chest pain x1 day. EXAM: CHEST  2 VIEW COMPARISON:  07/08/2015 CXR FINDINGS: The heart size and mediastinal contours are within normal limits. Both lungs are clear with some minimal atelectasis at each lung base. The visualized skeletal structures are unremarkable. IMPRESSION: No active cardiopulmonary disease. Electronically Signed   By: Tollie Eth M.D.   On: 03/04/2016 18:06    Procedures Procedures (including critical care time)  Medications Ordered in ED Medications  heparin ADULT infusion 100 units/mL (25000 units/231mL sodium chloride 0.45%) (1,500 Units/hr Intravenous Transfusing/Transfer 03/04/16 2209)  metoprolol (LOPRESSOR) injection 5 mg (5 mg Intravenous Given 03/04/16 1858)  metoprolol (LOPRESSOR) injection 5 mg (5 mg Intravenous Given 03/04/16 1923)    labetalol (NORMODYNE,TRANDATE) injection 20 mg (20 mg Intravenous Given 03/04/16 2032)  heparin bolus via infusion 5,000 Units (5,000 Units Intravenous Bolus from Bag 03/04/16 2201)     Initial Impression / Assessment and Plan / ED Course  I have reviewed the triage vital signs and the nursing notes.  Pertinent labs & imaging results that were available during my care of the patient were reviewed by me and considered in my medical decision making (see chart for details).  Clinical Course     55 y.o. male presents with chest discomfort that has worsened today. Has h/o atrial flutter previously. Initial EKG questionable for rhythm and after metoprolol appears consistent with flutter. When he moves around it goes to rate of 130s but remains in 3:1 block at rest just above 100. [place] is on diversion, discussed with hospitalist at [facility] who asks for cardiology input. He will need to be anticoagulated prior to any cardioversion so rate control with beta blockers was recommended. Admit to hospitalist for ACS r/o and rate control, decision on ongoing anticoagulation.   Final Clinical Impressions(s) / ED Diagnoses   Final diagnoses:  Chest pain, unspecified type  Typical atrial flutter (HCC)    New Prescriptions New Prescriptions   No medications on file   I personally performed the services described in this documentation, which was scribed in my presence. The recorded information has been reviewed and is accurate.     Lyndal Pulley, MD 03/05/16 [phone]

## 2016-03-04 NOTE — ED Triage Notes (Signed)
Pt c/o Chest pain with SOB and increased BP x 1 day

## 2016-03-04 NOTE — ED Notes (Signed)
Patient c/o acid reflux that has caused shortness of breath today. Patient is supposed to take Nexium and another OTC acid reducer. Patient has not been taking them as he is "out". Patient states he takes blood pressure medication and metformin, but is unsure if he has taken it consistently over the past few days.

## 2016-03-04 NOTE — ED Notes (Signed)
Attempted to give report. Nurse unavailable.

## 2016-03-05 ENCOUNTER — Encounter (HOSPITAL_COMMUNITY): Payer: Self-pay | Admitting: Internal Medicine

## 2016-03-05 ENCOUNTER — Observation Stay (HOSPITAL_BASED_OUTPATIENT_CLINIC_OR_DEPARTMENT_OTHER): Payer: Medicare HMO

## 2016-03-05 DIAGNOSIS — E7800 Pure hypercholesterolemia, unspecified: Secondary | ICD-10-CM | POA: Diagnosis not present

## 2016-03-05 DIAGNOSIS — I160 Hypertensive urgency: Secondary | ICD-10-CM | POA: Diagnosis not present

## 2016-03-05 DIAGNOSIS — I483 Typical atrial flutter: Secondary | ICD-10-CM

## 2016-03-05 DIAGNOSIS — I5022 Chronic systolic (congestive) heart failure: Secondary | ICD-10-CM

## 2016-03-05 DIAGNOSIS — R0789 Other chest pain: Secondary | ICD-10-CM

## 2016-03-05 DIAGNOSIS — R079 Chest pain, unspecified: Secondary | ICD-10-CM

## 2016-03-05 DIAGNOSIS — R0602 Shortness of breath: Secondary | ICD-10-CM | POA: Diagnosis not present

## 2016-03-05 DIAGNOSIS — E8881 Metabolic syndrome: Secondary | ICD-10-CM | POA: Diagnosis not present

## 2016-03-05 DIAGNOSIS — I4892 Unspecified atrial flutter: Secondary | ICD-10-CM | POA: Diagnosis not present

## 2016-03-05 DIAGNOSIS — R072 Precordial pain: Secondary | ICD-10-CM | POA: Diagnosis not present

## 2016-03-05 LAB — CBC
HEMATOCRIT: 35.7 % — AB (ref 39.0–52.0)
HEMOGLOBIN: 11.7 g/dL — AB (ref 13.0–17.0)
MCH: 29.3 pg (ref 26.0–34.0)
MCHC: 32.8 g/dL (ref 30.0–36.0)
MCV: 89.5 fL (ref 78.0–100.0)
Platelets: 291 10*3/uL (ref 150–400)
RBC: 3.99 MIL/uL — ABNORMAL LOW (ref 4.22–5.81)
RDW: 14.1 % (ref 11.5–15.5)
WBC: 8.5 10*3/uL (ref 4.0–10.5)

## 2016-03-05 LAB — RAPID URINE DRUG SCREEN, HOSP PERFORMED
Amphetamines: NOT DETECTED
BARBITURATES: NOT DETECTED
Benzodiazepines: NOT DETECTED
COCAINE: NOT DETECTED
OPIATES: NOT DETECTED
TETRAHYDROCANNABINOL: NOT DETECTED

## 2016-03-05 LAB — GLUCOSE, CAPILLARY
GLUCOSE-CAPILLARY: 108 mg/dL — AB (ref 65–99)
GLUCOSE-CAPILLARY: 121 mg/dL — AB (ref 65–99)
GLUCOSE-CAPILLARY: 111 mg/dL — AB (ref 65–99)
GLUCOSE-CAPILLARY: 110 mg/dL — AB (ref 65–99)
Glucose-Capillary: 127 mg/dL — ABNORMAL HIGH (ref 65–99)

## 2016-03-05 LAB — ECHOCARDIOGRAM COMPLETE
HEIGHTINCHES: 71 in
WEIGHTICAEL: 4785.6 [oz_av]

## 2016-03-05 LAB — LIPID PANEL
CHOL/HDL RATIO: 3.7 ratio
Cholesterol: 215 mg/dL — ABNORMAL HIGH (ref 0–200)
HDL: 58 mg/dL (ref >40)
LDL Cholesterol: 109 mg/dL — ABNORMAL HIGH (ref 0–99)
TRIGLYCERIDES: 241 mg/dL — AB (ref <150)
VLDL: 48 mg/dL — ABNORMAL HIGH (ref 0–40)

## 2016-03-05 LAB — TROPONIN I
TROPONIN I: 0.05 ng/mL — AB (ref <0.03)
Troponin I: 0.04 ng/mL (ref <0.03)
Troponin I: 0.04 ng/mL (ref <0.03)

## 2016-03-05 LAB — HEPARIN LEVEL (UNFRACTIONATED)
HEPARIN UNFRACTIONATED: 0.52 [IU]/mL (ref 0.30–0.70)
HEPARIN UNFRACTIONATED: 0.52 [IU]/mL (ref 0.30–0.70)

## 2016-03-05 LAB — TSH: TSH: 2.19 u[IU]/mL (ref 0.350–4.500)

## 2016-03-05 MED ORDER — ALPRAZOLAM 0.25 MG PO TABS: 0.2500 mg | ORAL_TABLET | Freq: Two times a day (BID) | ORAL | Status: DC | PRN

## 2016-03-05 MED ORDER — HYDROXYZINE HCL 10 MG PO TABS
10.0000 mg | ORAL_TABLET | Freq: Three times a day (TID) | ORAL | Status: DC | PRN
  Filled 2016-03-05: qty 1

## 2016-03-05 MED ORDER — LISINOPRIL 40 MG PO TABS
40.0000 mg | ORAL_TABLET | Freq: Every day | ORAL | Status: DC
  Administered 2016-03-05 – 2016-03-06 (×2): 40 mg via ORAL
  Filled 2016-03-05 (×2): qty 1

## 2016-03-05 MED ORDER — SIMVASTATIN 40 MG PO TABS
40.0000 mg | ORAL_TABLET | Freq: Every day | ORAL | Status: DC
  Administered 2016-03-05 (×2): 40 mg via ORAL
  Filled 2016-03-05 (×2): qty 1

## 2016-03-05 MED ORDER — ACETAMINOPHEN 325 MG PO TABS: 650.0000 mg | ORAL_TABLET | ORAL | Status: DC | PRN

## 2016-03-05 MED ORDER — INSULIN ASPART 100 UNIT/ML ~~LOC~~ SOLN
0.0000 [IU] | Freq: Three times a day (TID) | SUBCUTANEOUS | Status: DC
  Administered 2016-03-05 (×2): 1 [IU] via SUBCUTANEOUS

## 2016-03-05 MED ORDER — INSULIN ASPART 100 UNIT/ML ~~LOC~~ SOLN: 0.0000 [IU] | Freq: Every day | SUBCUTANEOUS | Status: DC

## 2016-03-05 MED ORDER — TRIAMCINOLONE ACETONIDE 55 MCG/ACT NA AERO
2.0000 | INHALATION_SPRAY | Freq: Every day | NASAL | Status: DC
Start: 2016-03-05 — End: 2016-03-06
  Filled 2016-03-05: qty 21.6

## 2016-03-05 MED ORDER — LABETALOL HCL 200 MG PO TABS
200.0000 mg | ORAL_TABLET | Freq: Three times a day (TID) | ORAL | Status: DC
  Administered 2016-03-05 – 2016-03-06 (×4): 200 mg via ORAL
  Filled 2016-03-05 (×4): qty 1

## 2016-03-05 MED ORDER — ALBUTEROL SULFATE (2.5 MG/3ML) 0.083% IN NEBU: 2.5000 mg | INHALATION_SOLUTION | RESPIRATORY_TRACT | Status: DC | PRN

## 2016-03-05 MED ORDER — FUROSEMIDE 20 MG PO TABS
20.0000 mg | ORAL_TABLET | Freq: Every day | ORAL | Status: DC
  Administered 2016-03-05 – 2016-03-06 (×2): 20 mg via ORAL
  Filled 2016-03-05 (×2): qty 1

## 2016-03-05 MED ORDER — CLONIDINE HCL 0.1 MG PO TABS
0.1000 mg | ORAL_TABLET | Freq: Three times a day (TID) | ORAL | Status: DC
  Administered 2016-03-05 – 2016-03-06 (×4): 0.1 mg via ORAL
  Filled 2016-03-05 (×4): qty 1

## 2016-03-05 MED ORDER — IBUPROFEN 800 MG PO TABS: 800.0000 mg | ORAL_TABLET | Freq: Four times a day (QID) | ORAL | Status: DC | PRN

## 2016-03-05 MED ORDER — IBUPROFEN 800 MG PO TABS: 800.0000 mg | ORAL_TABLET | Freq: Three times a day (TID) | ORAL | Status: DC | PRN

## 2016-03-05 MED ORDER — PANTOPRAZOLE SODIUM 40 MG PO TBEC
40.0000 mg | DELAYED_RELEASE_TABLET | Freq: Every day | ORAL | Status: DC
  Administered 2016-03-05 – 2016-03-06 (×2): 40 mg via ORAL
  Filled 2016-03-05 (×2): qty 1

## 2016-03-05 MED ORDER — FUROSEMIDE 10 MG/ML IJ SOLN
40.0000 mg | Freq: Once | INTRAMUSCULAR | Status: AC
  Administered 2016-03-05: 40 mg via INTRAVENOUS
  Filled 2016-03-05: qty 4

## 2016-03-05 MED ORDER — OXYCODONE-ACETAMINOPHEN 5-325 MG PO TABS
1.0000 | ORAL_TABLET | ORAL | Status: DC | PRN
  Administered 2016-03-06: 1 via ORAL
  Filled 2016-03-05: qty 1

## 2016-03-05 MED ORDER — HYDRALAZINE HCL 20 MG/ML IJ SOLN: 5.0000 mg | INTRAMUSCULAR | Status: DC | PRN

## 2016-03-05 MED ORDER — ALLOPURINOL 100 MG PO TABS
100.0000 mg | ORAL_TABLET | Freq: Every day | ORAL | Status: DC
  Administered 2016-03-05 – 2016-03-06 (×2): 100 mg via ORAL
  Filled 2016-03-05 (×2): qty 1

## 2016-03-05 MED ORDER — ZOLPIDEM TARTRATE 5 MG PO TABS: 5.0000 mg | ORAL_TABLET | Freq: Every evening | ORAL | Status: DC | PRN

## 2016-03-05 MED ORDER — RIVAROXABAN 20 MG PO TABS
20.0000 mg | ORAL_TABLET | Freq: Every day | ORAL | Status: DC
  Administered 2016-03-05: 20 mg via ORAL
  Filled 2016-03-05: qty 1

## 2016-03-05 NOTE — Progress Notes (Signed)
PROGRESS NOTE    Isaac Valdez  ZOX:[id number]  DOB: 30-Oct-1960  DOA: 03/04/2016 PCP: Karle PlumberARVIND,MOOGALI M, MD Outpatient Specialists: [facility]   Hospital course: Isaac Valdez is a 55 y.o. male with medical history significant of hypertension, hyperlipidemia, diabetes mellitus, gout, sCHF, A flutter not on anticoagulants, back pain, who presents with chest pain.  Patient states that his chest pain started in this morning. It is located in the substernal area, intermittently, 9 out of 10 in severity, pressure-like pain, nonradiating. It is exertional. Patient does not have cough, fever or chills. He has mild shortness of breath. Patient has nausea, and vomited once. No diarrhea or abdominal pain. Denies symptoms of UTI or unilateral weakness. His blood pressure is significantly elevated at 216/172 on admission. Patient states that he missed some doses of blood pressure medications sometimes.  Assessment & Plan:   Chest pain: Troponin slightly elevated at 0.05. Likely due to demand ischemia secondary to hypertensive urgency.  - Continue Tele bed for obs - BP much better controlled - cycle CE q6 x3 - Nitroglycerin, Morphine, and zocor - pt is allergic to aspirin - Risk factor stratification: will check FLP, UDS and A1C  - 2d echo pending - cardiology service consulted  Hypertensive urgency: Blood pressure is 216/172 on admission, which has improved -IV hydralazine when necessary -Give Lasix 40 mg 1 -Increase clonidine dose from 0.1 twice a day to 3 times a day -Continue labetalol and lisinopril  Atrial flutter  hypertension per patient. His atrial flutter had resolved per patient. Now he has recurrent atrial flutter. CHA2DS2-VASc Score is 3, needs oral anticoagulation. -will start IV heparin  -on labetalol -need to consult to Card for possible conversion  HLD: Last LDL was not on record -Continue home medications: Zocor -TC 215, LDL 109, Trig  241  Chronic systolic heart failure : He has cardiomyopathy Per Dr. Desma MaximMcGukin report, his recent EF was 40-45 percent 9/16. Patient has trace leg edema, but no JVD. CHF seems to be compensated. BNP 317, indicating patient is at risk of developing CHF exacerbation -Will give 1 dose of Lasix 40 mg 1 -continue home dose of lasix 20 mg daily  Metabolic Syndrome (X) - encouraged weight loss, BP control, lipid control, glycemic control.   DVT ppx: On IV Heparin Code Status: Full code Family Communication: Yes, patient's wife at bed side Disposition Plan:  Anticipate discharge back to previous home environment Consults called: none  Admission status: Obs / tele    Subjective: Pt denies complaints. Says he wants to eat.  No more chest pain at this time.  Objective: Vitals:   03/05/16 0200 03/05/16 0341 03/05/16 0439 03/05/16 0822  BP: (!) 207/106 (!) 163/84 (!) 163/87 (!) 159/84  Pulse: (!) 101 95 95 95  Resp: 18 18 18 18   Temp:   98.3 F (36.8 C) 98 F (36.7 C)  TempSrc:   Oral Oral  SpO2: 100% 100% 97% 99%  Weight:      Height:        Intake/Output Summary (Last 24 hours) at 03/05/16 0915 Last data filed at 03/05/16 0755  Gross per 24 hour  Intake           699.75 ml  Output             1750 ml  Net         -1050.25 ml   Filed Weights   03/04/16 1746 03/04/16 2315  Weight: (!) 137 kg (302 lb) 135.7 kg (  299 lb 1.6 oz)    Exam:  General exam: awake, alert, no distress, cooperative.  Respiratory system: CTA. No increased work of breathing. Cardiovascular system: S1 & S2 heard, irregular. No JVD, murmurs, gallops, clicks or pedal edema. Gastrointestinal system: Abdomen is obese. nondistended, soft and nontender. Normal bowel sounds heard. Central nervous system: Alert and oriented. No focal neurological deficits. Extremities: no CCE.  Data Reviewed: Basic Metabolic Panel:  Recent Labs Lab 03/04/16 1819  NA 140  K 3.9  CL 107  CO2 22  GLUCOSE 98  BUN 14    CREATININE 1.09  CALCIUM 9.3   Liver Function Tests:  Recent Labs Lab 03/04/16 1819  AST 42*  ALT 32  ALKPHOS 59  BILITOT 0.5  PROT 8.4*  ALBUMIN 4.2   No results for input(s): LIPASE, AMYLASE in the last 168 hours. No results for input(s): AMMONIA in the last 168 hours. CBC:  Recent Labs Lab 03/04/16 1819 03/05/16 0127  WBC 5.7 8.5  NEUTROABS 3.8  --   HGB 12.0* 11.7*  HCT 36.1* 35.7*  MCV 90.5 89.5  PLT 286 291   Cardiac Enzymes:  Recent Labs Lab 03/04/16 1819 03/05/16 0127 03/05/16 0701  TROPONINI 0.05* 0.05* 0.04*   CBG (last 3)   Recent Labs  03/05/16 0202 03/05/16 0630  GLUCAP 108* 121*   No results found for this or any previous visit (from the past 240 hour(s)).   Studies: Dg Chest 2 View  Result Date: 03/04/2016 CLINICAL DATA:  Mid day chest pain x1 day. EXAM: CHEST  2 VIEW COMPARISON:  07/08/2015 CXR FINDINGS: The heart size and mediastinal contours are within normal limits. Both lungs are clear with some minimal atelectasis at each lung base. The visualized skeletal structures are unremarkable. IMPRESSION: No active cardiopulmonary disease. Electronically Signed   By: Tollie Eth M.D.   On: 03/04/2016 18:06     Scheduled Meds: . allopurinol  100 mg Oral Daily  . cloNIDine  0.1 mg Oral Q8H  . furosemide  20 mg Oral Daily  . insulin aspart  0-5 Units Subcutaneous QHS  . insulin aspart  0-9 Units Subcutaneous TID WC  . labetalol  200 mg Oral Q8H  . lisinopril  40 mg Oral Daily  . simvastatin  40 mg Oral QHS  . triamcinolone  2 spray Nasal Daily   Continuous Infusions: . heparin 1,500 Units/hr (03/04/16 2204)    Principal Problem:   Chest pain Active Problems:   Atrial flutter (HCC)   High cholesterol   Hypertensive urgency   Chronic systolic heart failure (HCC)  Time spent:   Standley Dakins, MD, FAAFP [facility] Pager [phone]  If 7PM-7AM, please contact night-coverage [email or web address] Password  [email or web address] 03/05/2016, 9:15 AM    LOS: 0 days

## 2016-03-05 NOTE — Progress Notes (Signed)
Dr. Jarvis Morgan made aware thru paged regarding patient's recent 12 lead EKG result showing Acute MI/STEMI considering right ventricular involvement in acute inferior infarct.  Dr. Clyde Lundborg called back after reviewing EKG result and informing this RN that it is not STEMI and that patient should be alright since on heparin drip.

## 2016-03-05 NOTE — Progress Notes (Addendum)
ANTICOAGULATION CONSULT NOTE - Follow-up Consult  Pharmacy Consult for heparin Indication: atrial fibrillation  Allergies  Allergen Reactions  . Aspirin Anaphylaxis and Other (See Comments)    "indigestion"    Patient Measurements: Height: 5\' 11"  (180.3 cm) Weight: 299 lb 1.6 oz (135.7 kg) IBW/kg (Calculated) : 75.3 Heparin Dosing Weight: 107kg  Vital Signs: Temp: 98 F (36.7 C) (12/28 0822) Temp Source: Oral (12/28 0822) BP: 159/84 (12/28 0822) Pulse Rate: 95 (12/28 0822)  Labs:  Recent Labs  03/04/16 1819 03/05/16 0127 03/05/16 0348 03/05/16 0701  HGB 12.0* 11.7*  --   --   HCT 36.1* 35.7*  --   --   PLT 286 291  --   --   HEPARINUNFRC  --   --  0.52  --   CREATININE 1.09  --   --   --   TROPONINI 0.05* 0.05*  --  0.04*    Estimated Creatinine Clearance: 110.3 mL/min (by C-G formula based on SCr of 1.09 mg/dL).   Medical History: Past Medical History:  Diagnosis Date  . Back pain   . CHF (congestive heart failure) (HCC)   . Diabetes mellitus   . Gout   . High cholesterol   . Hypertension     Medications:  Infusions:  . heparin 1,500 Units/hr (03/05/16 0955)    Assessment: 55 yom presented to the ED with SOB and CP. To start heparin IV for afib. He is not on anticoagulation PTA. Baseline H/H is slightly low but platelets are WNL. Heparin level therapeutic this morning at 0.52    Goal of Therapy:  Heparin level 0.3-0.7 units/ml Monitor platelets by anticoagulation protocol: Yes   Plan:  Continue Heparin gtt 1500 units/hr Check a confirmatory 6 hr heparin level Daily heparin level and CBC   Thank you for allowing [place] to participate in this patients care. Signe Colt, PharmD Pager: [phone] 03/05/2016,11:19 AM  Addendum: Heparin level this afternoon remains therapeutic at 0.52. Continue current rate and monitor daily level.

## 2016-03-05 NOTE — Progress Notes (Signed)
ANTICOAGULATION CONSULT NOTE Pharmacy Consult for heparin Indication: atrial fibrillation  Allergies  Allergen Reactions  . Aspirin Anaphylaxis    "indigestion"    Patient Measurements: Height: 5\' 11"  (180.3 cm) Weight: 299 lb 1.6 oz (135.7 kg) IBW/kg (Calculated) : 75.3 Heparin Dosing Weight: 107kg  Vital Signs: Temp: 98.3 F (36.8 C) (12/28 0439) Temp Source: Oral (12/28 0439) BP: 163/87 (12/28 0439) Pulse Rate: 95 (12/28 0439)  Labs:  Recent Labs  03/04/16 1819 03/05/16 0127 03/05/16 0348  HGB 12.0* 11.7*  --   HCT 36.1* 35.7*  --   PLT 286 291  --   HEPARINUNFRC  --   --  0.52  CREATININE 1.09  --   --   TROPONINI 0.05* 0.05*  --     Estimated Creatinine Clearance: 110.3 mL/min (by C-G formula based on SCr of 1.09 mg/dL).  Assessment: 55 y.o. male with Afib for heparin   Goal of Therapy:  Heparin level 0.3-0.7 units/ml Monitor platelets by anticoagulation protocol: Yes   Plan:  Continue Heparin at current rate  F/U plan for oral anticoagulation  Stanton Kissoon, Gary Fleet 03/05/2016,4:42 AM

## 2016-03-05 NOTE — Care Management Obs Status (Signed)
MEDICARE OBSERVATION STATUS NOTIFICATION   Patient Details  Name: Shaft Tia MRN: [id number] Date of Birth: January 23, 1961   Medicare Observation Status Notification Given:  Yes    Elliot Cousin, RN 03/05/2016, 5:17 PM

## 2016-03-05 NOTE — Progress Notes (Signed)
CRITICAL VALUE ALERT  Critical value received:  Troponin = 0.05 (same as previous in [facility])  Date of notification:  03/05/2016   Time of notification:  0345  Critical value read back:Yes.    Nurse who received alert:  C. Noboru Bidinger  MD notified (1st page):  Dr. Jarvis Morgan  Time of first page:  0400  MD notified (2nd page):  Time of second page:  Responding MD:  Awaiting response  Time MD responded:  Awaiting response

## 2016-03-05 NOTE — Consult Note (Signed)
Admit date: 03/04/2016 Referring Physician  Dr. Laural Benes, C Primary Physician Karle Plumber, MD Primary Cardiologist  Dr. Rudolpho Sevin, Dr. Ginger Carne, [facility] cardiology, [facility] Reason for Consultation  Chest pain, elevated troponin  HPI: 55 year old male with history of typical atrial flutter and cardiomyopathy ejection fraction 45-50% on echocardiogram in September 2016 with secondary pulmonary hypertension, RVSP 71 mmHg, history of medication noncompliance, history of uncontrolled hypertension, morbid obesity, obstructive sleep apnea, hyperlipidemia followed at [facility] cardiology [facility] here with chest pain, mildly elevated troponin, flat, 0.04. Chest pain substernal, 9 out of 10, pressure-like, nonradiating that may be exertional. Mild shortness of breath. Blood pressure was 216/172 on admission. He also apparently vomited once with mild nausea.  Previous EKGs have shown sinus rhythm versus slow atrial flutter with controlled heart rate of 70. He is not on anticoagulation apparently because of noncompliance.  EKG performed today on 03/05/16 shows similar findings to prior EKG from 2016 with ejection fraction of 45%. Right ventricular systolic pressures are unable to be calculated because of poor tricuspid jet signal.  Because of his hypertensive urgency, Lasix 40 mg 1 was given, clonidine dose was increased from 0.1 twice a day to 3 times a day. Labetalol lisinopril were continued.  EKG showed typical atrial flutter with heart rate of 95 bpm. Back on 02/12/2015 his EKG showed a slow atrial flutter with controlled ventricular rate. This is not new.  He tried to eat some lunch earlier and did feel some indigestion with this. He has battled with heartburn chronically in the past. Usually Zantac seems to help. DEXAlent was $200 for him per month and too expensive.  PMH:   Past Medical History:  Diagnosis Date  . Back pain   . CHF (congestive heart failure) (HCC)   . Diabetes  mellitus   . Gout   . High cholesterol   . Hypertension     PSH:   Past Surgical History:  Procedure Laterality Date  . BACK SURGERY    . MANDIBLE FRACTURE SURGERY     Allergies:  Aspirin Prior to Admit Meds:   Prior to Admission medications   Medication Sig Start Date End Date Taking? Authorizing Provider  albuterol (PROVENTIL HFA;VENTOLIN HFA) 108 (90 BASE) MCG/ACT inhaler Inhale 1-2 puffs into the lungs every 6 (six) hours as needed for wheezing. 06/24/11 03/05/16 Yes Vanetta Mulders, MD  allopurinol (ZYLOPRIM) 100 MG tablet Take 100 mg by mouth daily as needed (for gout).    Yes Historical Provider, MD  cloNIDine (CATAPRES) 0.1 MG tablet Take 0.1 mg by mouth daily.   Yes Historical Provider, MD  labetalol (NORMODYNE) 200 MG tablet Take 200 mg by mouth 2 (two) times daily.    Yes Historical Provider, MD  lisinopril (PRINIVIL,ZESTRIL) 40 MG tablet Take 40 mg by mouth daily.     Yes Historical Provider, MD  metFORMIN (GLUCOPHAGE) 500 MG tablet Take 250-500 mg by mouth daily as needed (for blood sugar).    Yes Historical Provider, MD  simvastatin (ZOCOR) 40 MG tablet Take 40 mg by mouth at bedtime.     Yes Historical Provider, MD  torsemide (DEMADEX) 20 MG tablet Take 20 mg by mouth daily. 03/22/15  Yes Historical Provider, MD  ibuprofen (ADVIL,MOTRIN) 200 MG tablet Take 800 mg by mouth every 6 (six) hours as needed for headache or moderate pain.    Historical Provider, MD  traMADol (ULTRAM) 50 MG tablet Take 1 tablet (50 mg total) by mouth every 6 (six) hours as needed  for pain. Patient not taking: Reported on 03/05/2016 12/14/11   Gwyneth Sprout, MD  triamcinolone (NASACORT AQ) 55 MCG/ACT AERO nasal inhaler Place 2 sprays into the nose daily. Patient not taking: Reported on 03/05/2016 04/14/13   Irish Elders, FNP   Fam HX:    Family History  Problem Relation Age of Onset  . Heart disease Mother   . Hypertension Sister    Social HX:    Social History   Social History  . Marital  status: Married    Spouse name: N/A  . Number of children: N/A  . Years of education: N/A   Occupational History  . Not on file.   Social History Main Topics  . Smoking status: Former Smoker    Packs/day: 0.50  . Smokeless tobacco: Not on file  . Alcohol use Yes     Comment: rarely  . Drug use: No  . Sexual activity: Not on file   Other Topics Concern  . Not on file   Social History Narrative  . No narrative on file     ROS:  All 11 ROS were addressed and are negative except what is stated in the HPI   Physical Exam: Blood pressure (!) 170/96, pulse 98, temperature 98.1 F (36.7 C), temperature source Oral, resp. rate 18, height 5\' 11"  (1.803 m), weight 299 lb 1.6 oz (135.7 kg), SpO2 100 %.   General: Well developed, well nourished, in no acute distress Head: Eyes PERRLA, No xanthomas.   Normal cephalic and atramatic  Lungs:   Clear bilaterally to auscultation and percussion. Normal respiratory effort. No wheezes, no rales. Heart:   HRRR S1 S2 Pulses are 2+ & equal. No murmur, rubs, gallops.  No carotid bruit. No JVD.  No abdominal bruits.  Abdomen: Bowel sounds are positive, abdomen soft and non-tender without masses. No hepatosplenomegaly.Obese Msk:  Back normal. Normal strength and tone for age. Extremities:  No clubbing, cyanosis or edema.  DP +1 Neuro: Alert and oriented X 3, non-focal, MAE x 4 GU: Deferred Rectal: Deferred Psych:  Good affect, responds appropriately      Labs: Lab Results  Component Value Date   WBC 8.5 03/05/2016   HGB 11.7 (L) 03/05/2016   HCT 35.7 (L) 03/05/2016   MCV 89.5 03/05/2016   PLT 291 03/05/2016     Recent Labs Lab 03/04/16 1819  NA 140  K 3.9  CL 107  CO2 22  BUN 14  CREATININE 1.09  CALCIUM 9.3  PROT 8.4*  BILITOT 0.5  ALKPHOS 59  ALT 32  AST 42*  GLUCOSE 98    Recent Labs  03/04/16 1819 03/05/16 0127 03/05/16 0701 03/05/16 1251  TROPONINI 0.05* 0.05* 0.04* 0.04*   Lab Results  Component Value Date    CHOL 215 (H) 03/05/2016   HDL 58 03/05/2016   LDLCALC 109 (H) 03/05/2016   TRIG 241 (H) 03/05/2016   Lab Results  Component Value Date   DDIMER 0.76 (H) 12/31/2014     Radiology:  Dg Chest 2 View  Result Date: 03/04/2016 CLINICAL DATA:  Mid day chest pain x1 day. EXAM: CHEST  2 VIEW COMPARISON:  07/08/2015 CXR FINDINGS: The heart size and mediastinal contours are within normal limits. Both lungs are clear with some minimal atelectasis at each lung base. The visualized skeletal structures are unremarkable. IMPRESSION: No active cardiopulmonary disease. Electronically Signed   By: Tollie Eth M.D.   On: 03/04/2016 18:06   Personally viewed.  EKG:  Atrial flutter, typical  pattern heart rate 96 bpm Personally viewed.   ASSESSMENT/PLAN:    55 year old male with history of atrial flutter followed by Dr. Jeanne IvanAkbari hi45story of medical noncompliance, systolic/diastolic heart failure ejection fraction 45%, secondary pulmonary hypertension, morbid obesity, obstructive sleep apnea here with transient chest pain and low level troponin increase of 0.04-0.05.  Chest pain  - May be associated with possible viral gastroenteritis given his previous nausea, vomiting. Low level troponin increase not likely acute coronary syndrome. EF on echocardiogram unchanged from prior 45%. Troponin elevation flat, demand ischemia in the setting of hypertensive urgency. Chest pain could have been also exacerbated by his underlying hypertension. There is no evidence of mediastinal widening on chest x-ray. Pulses are equal bilaterally. No further testing at this time. Continue with aggressive blood pressure control.  - Patient has aspirin allergy.  - Prior [facility] per patient was unremarkable. [place] also [place] which were unremarkable.  Hypertensive urgency  - Per primary team. May need to further adjust his antihypertensives.  Morbid  obesity  - Continue to encourage weight loss. Low carbohydrate.  - This would help with his blood pressure as well has sleep apnea  Chronic diastolic heart failure  - Received a dose of Lasix. This will help with hypertension.  - Continue treat underlying hypertension.  - BNP 317  - Continue home medications.  Atrial flutter  - Reviewed care everywhere and prior [place] [facility] [place]. He has had known atrial flutter with normal ventricular response. No changes.  - Apparently, anticoagulation has not been used in the past because of concern for noncompliance.  - CHA2DS2-VASc Scoreis at least 2 for diabetes and hypertension. Technically he does not have a markedly reduced ejection fraction hence does not likely get a score for C. Nonetheless, two and greater equals anticoagulation. I would recommend Xarelto 20 mg once a day. I will start.  GERD  - Consider PPI.  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   Donato SchultzMark Skains, MD  03/05/2016  3:14 PM

## 2016-03-05 NOTE — Progress Notes (Signed)
  Echocardiogram 2D Echocardiogram has been performed.  Delcie Roch 03/05/2016, 2:39 PM

## 2016-03-05 NOTE — Progress Notes (Signed)
ANTICOAGULATION CONSULT NOTE - Follow-up Consult  Pharmacy Consult for heparin Indication: atrial fibrillation  Patient Measurements: Height: 5\' 11"  (180.3 cm) Weight: 299 lb 1.6 oz (135.7 kg) IBW/kg (Calculated) : 75.3 Heparin Dosing Weight: 107kg  Vital Signs: Temp: 98.1 F (36.7 C) (12/28 1144) Temp Source: Oral (12/28 1144) BP: 170/96 (12/28 1321) Pulse Rate: 98 (12/28 1321)  Labs:  Recent Labs  03/04/16 1819 03/05/16 0127 03/05/16 0348 03/05/16 0701 03/05/16 1251  HGB 12.0* 11.7*  --   --   --   HCT 36.1* 35.7*  --   --   --   PLT 286 291  --   --   --   HEPARINUNFRC  --   --  0.52  --  0.52  CREATININE 1.09  --   --   --   --   TROPONINI 0.05* 0.05*  --  0.04* 0.04*    Estimated Creatinine Clearance: 110.3 mL/min (by C-G formula based on SCr of 1.09 mg/dL).  Medications:  Infusions:  . heparin 1,500 Units/hr (03/05/16 0955)    Assessment: 14 YOM who presented to the ED with SOB and CP and was found to have recurrent Afib and was started on IV Heparin. Pharmacy has now been consulted to transition to Xarelto. SCr 1.09, CrCl~100 ml/min.   Goal of Therapy:  Appropriate anticoagulation for indication and hepatic/renal function   Plan 1. D/c Heparin at 1659 2. Start Xarelto 20 mg daily with supper starting at 1700 3. Will plan to educate prior to discharge 4. Will monitor for s/sx of bleeding  Thank you for allowing pharmacy to be a part of this patient's care.  Georgina Pillion, PharmD, BCPS Clinical Pharmacist Pager: [phone] 03/05/2016 4:34 PM

## 2016-03-06 DIAGNOSIS — E7800 Pure hypercholesterolemia, unspecified: Secondary | ICD-10-CM | POA: Diagnosis not present

## 2016-03-06 DIAGNOSIS — I483 Typical atrial flutter: Secondary | ICD-10-CM | POA: Diagnosis not present

## 2016-03-06 DIAGNOSIS — R0789 Other chest pain: Secondary | ICD-10-CM | POA: Diagnosis not present

## 2016-03-06 DIAGNOSIS — I5022 Chronic systolic (congestive) heart failure: Secondary | ICD-10-CM | POA: Diagnosis not present

## 2016-03-06 LAB — CBC
HEMATOCRIT: 35.3 % — AB (ref 39.0–52.0)
HEMOGLOBIN: 11.6 g/dL — AB (ref 13.0–17.0)
MCH: 29.6 pg (ref 26.0–34.0)
MCHC: 32.9 g/dL (ref 30.0–36.0)
MCV: 90.1 fL (ref 78.0–100.0)
Platelets: 259 10*3/uL (ref 150–400)
RBC: 3.92 MIL/uL — ABNORMAL LOW (ref 4.22–5.81)
RDW: 14 % (ref 11.5–15.5)
WBC: 4.9 10*3/uL (ref 4.0–10.5)

## 2016-03-06 LAB — GLUCOSE, CAPILLARY
Glucose-Capillary: 102 mg/dL — ABNORMAL HIGH (ref 65–99)
Glucose-Capillary: 110 mg/dL — ABNORMAL HIGH (ref 65–99)

## 2016-03-06 LAB — HEMOGLOBIN A1C
Hgb A1c MFr Bld: 6.1 % — ABNORMAL HIGH (ref 4.8–5.6)
MEAN PLASMA GLUCOSE: 128 mg/dL

## 2016-03-06 MED ORDER — RIVAROXABAN 20 MG PO TABS: 20.0000 mg | ORAL_TABLET | Freq: Every day | ORAL | 0 refills | Status: DC

## 2016-03-06 MED ORDER — PANTOPRAZOLE SODIUM 40 MG PO TBEC: 40.0000 mg | DELAYED_RELEASE_TABLET | Freq: Every day | ORAL | 0 refills | Status: DC

## 2016-03-06 NOTE — Discharge Instructions (Signed)

## 2016-03-06 NOTE — Care Management Note (Signed)
Case Management Note  Patient Details  Name: Isaac Valdez MRN: [id number] Date of Birth: [birth year]  Subjective/Objective:  Chest pain, aflutter, CHF, HTN                  Action/Plan: Discharge Planning: AVS reviewed:  NCM spoke to pt and wife at bedside. Provided pt with Xarelto 30 day free trial card. [facility] has med in Regulatory affairs officer. His copay $47 per month. Pt independent prior to hospitalization.   PCP ARVIND, Idelia Salm MD  Expected Discharge Date:                  Expected Discharge Plan:  Home/Self Care  In-House Referral:  NA  Discharge planning Services  CM Consult, Medication Assistance  Post Acute Care Choice:  NA Choice offered to:  NA  DME Arranged:  N/A DME Agency:  NA  HH Arranged:  NA HH Agency:  NA  Status of Service:  Completed, signed off  If discussed at Long Length of Stay Meetings, dates discussed:    Additional Comments:  Elliot Cousin, RN 03/06/2016, 11:22 AM

## 2016-03-06 NOTE — Progress Notes (Signed)
Pt has been discharged home with wife. IV and telemetry box removed. Pt was instructed to pick up his prescriptions after discharge. Pt verbalized understanding. Pt received discharge instructions and all questions were answered. Pt left with all of his belongings. Pt left the unit via wheelchair and was accompanied by a Ferne Coe.   Berdine Dance BSN, RN

## 2016-03-06 NOTE — Discharge Summary (Signed)
Physician Discharge Summary  Isaac Valdez BZX:[id number] DOB: [birth year] DOA: 03/04/2016  PCP: Karle Plumber, MD  Admit date: 03/04/2016 Discharge date: 03/06/2016  Admitted From: Home  Disposition:  Home   Recommendations for Outpatient Follow-up:  1. Follow up with PCP in 1 weeks 2. Follow up with cardiologist in 2 weeks  Discharge Condition: STABLE CODE STATUS:* FULL  Brief/Interim Summary: HPI: Isaac Valdez is a 55 y.o. male with medical history significant of hypertension, hyperlipidemia, diabetes mellitus, gout, sCHF, A flutter not on anticoagulants, back pain, who presents with chest pain.  Patient states that his chest pain started in this morning. It is located in the substernal area, intermittently, 9 out of 10 in severity, pressure-like pain, nonradiating. It is exertional. Patient does not have cough, fever or chills. He has mild shortness of breath. Patient has nausea, and vomited once. No diarrhea or abdominal pain. Denies symptoms of UTI or unilateral weakness. His blood pressure is significantly elevated at 216/172 on admission. Patient states that he missed some doses of blood pressure medications sometimes.  ED Course: pt was found to have troponin 0.05, flutter on EKG, BNP 317, electrolytes and renal function okay, negative chest x-ray, temperature normal, oxygen saturation 99% on room air. Patient is placed on telemetry bed for observation. Previous EKGs have shown sinus rhythm versus slow atrial flutter with controlled heart rate of 70. He is not on anticoagulation apparently because of noncompliance.  EKG performed today on 03/05/16 shows similar findings to prior EKG from 2016 with ejection fraction of 45%. Right ventricular systolic pressures are unable to be calculated because of poor tricuspid jet signal.  Because of his hypertensive urgency, Lasix 40 mg 1 was given, clonidine dose was increased from 0.1 twice a day to 3 times a day. Labetalol  lisinopril were continued.  EKG showed typical atrial flutter with heart rate of 95 bpm. Back on 02/12/2015 his EKG showed a slow atrial flutter with controlled ventricular rate. This is not new.  Chest pain  - May be associated with possible viral gastroenteritis given his previous nausea, vomiting. Low level troponin increase not likely acute coronary syndrome. EF on echocardiogram unchanged from prior 45%. Troponin elevation flat, demand ischemia in the setting of hypertensive urgency. Chest pain could have been also exacerbated by his underlying hypertension. There is no evidence of mediastinal widening on chest x-ray. Pulses are equal bilaterally. No further testing at this time recommended by cardiology team. Continue with aggressive blood pressure control.  - Patient has aspirin allergy.  - Prior stress tests/catheterization at [facility] per patient was unremarkable. He also had a stress test in [place] as well as [place] both which were unremarkable.  Hypertensive urgency  - blood pressure controlled when he takes meds as prescribed, suspect compliance is an issue, follow up with PCP for ongoing management.  Morbid obesity / Metabolic syndrome  - Continue to encourage weight loss. Low carbohydrate.  - This would help with his blood pressure as well has sleep apnea  Chronic diastolic heart failure  - Received a dose of Lasix. This will help with hypertension.  - Continue treat underlying hypertension.  - BNP 317  - Continue home medications.  Atrial flutter  - Reviewed care everywhere and prior visit with [place] [facility] [place]. He has had known atrial flutter with normal ventricular response. No changes.  - Apparently, anticoagulation has not been used in the past because of concern for noncompliance.  - CHA2DS2-VASc Scoreis at least 2 for  diabetes and hypertension. Technically he does not have a markedly reduced ejection fraction hence does not likely  get a score for C. Nonetheless, two and greater equals anticoagulation.  Cardiology team recommended Xarelto 20 mg once a day.  Pt was educated by pharmacist and agreed to take.  Pt will follow up with his regular cardiologist in 2 weeks for recheck.   GERD  - discharging with prescription for protonix and asked patient to follow up with PCP.   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  Discharge Diagnoses:  Principal Problem:   Chest pain Active Problems:   Atrial flutter (HCC)   High cholesterol   Hypertensive urgency   Chronic systolic heart failure (HCC)   Typical atrial flutter (HCC)   Metabolic syndrome  Discharge Instructions  Allergies as of 03/06/2016      Reactions   Aspirin Anaphylaxis, Other (See Comments)   "indigestion"      Medication List    STOP taking these medications   traMADol 50 MG tablet Commonly known as:  ULTRAM   triamcinolone 55 MCG/ACT Aero nasal inhaler Commonly known as:  NASACORT AQ     TAKE these medications   albuterol 108 (90 Base) MCG/ACT inhaler Commonly known as:  PROVENTIL HFA;VENTOLIN HFA Inhale 1-2 puffs into the lungs every 6 (six) hours as needed for wheezing.   allopurinol 100 MG tablet Commonly known as:  ZYLOPRIM Take 100 mg by mouth daily as needed (for gout).   cloNIDine 0.1 MG tablet Commonly known as:  CATAPRES Take 0.1 mg by mouth daily.   ibuprofen 200 MG tablet Commonly known as:  ADVIL,MOTRIN Take 800 mg by mouth every 6 (six) hours as needed for headache or moderate pain.   labetalol 200 MG tablet Commonly known as:  NORMODYNE Take 200 mg by mouth 2 (two) times daily.   lisinopril 40 MG tablet Commonly known as:  PRINIVIL,ZESTRIL Take 40 mg by mouth daily.   metFORMIN 500 MG tablet Commonly known as:  GLUCOPHAGE Take 250-500 mg by mouth daily as needed (for blood sugar).   pantoprazole 40 MG tablet Commonly known as:  PROTONIX Take 1 tablet (40  mg total) by mouth daily at 6 (six) AM. Start taking on:  03/07/2016   rivaroxaban 20 MG Tabs tablet Commonly known as:  XARELTO Take 1 tablet (20 mg total) by mouth daily with supper.   simvastatin 40 MG tablet Commonly known as:  ZOCOR Take 40 mg by mouth at bedtime.   torsemide 20 MG tablet Commonly known as:  DEMADEX Take 20 mg by mouth daily.      Follow-up Information    Karle PlumberARVIND,MOOGALI M, MD. Schedule an appointment as soon as possible for a visit in 1 week(s).   Specialty:  Internal Medicine Why:  Hospital Follow Up  Contact information: [phone] [place] [address] [phone]        [facility],JAMES, MD. Schedule an appointment as soon as possible for a visit in 2 week(s).   Specialty:  Cardiology Why:  Hospital Follow Up  Contact information: [address] [place] [address] [phone]          Allergies  Allergen Reactions  . Aspirin Anaphylaxis and Other (See Comments)    "indigestion"    Procedures/Studies: Dg Chest 2 View  Result Date: 03/04/2016 CLINICAL DATA:  Mid day chest pain x1 day. EXAM: CHEST  2 VIEW COMPARISON:  07/08/2015 CXR FINDINGS: The heart size and mediastinal contours are within normal limits. Both lungs are clear with some minimal atelectasis at each lung base. The visualized skeletal structures are unremarkable. IMPRESSION: No active cardiopulmonary disease. Electronically Signed   By: Tollie Eth M.D.   On: 03/04/2016 18:06     Echocardiogram : Study Conclusions  - Left ventricle: The cavity size was normal. Wall thickness was  increased in a pattern of mild LVH. Systolic function was mildly   reduced. The estimated ejection fraction was in the range of 45% to 50%. Diffuse hypokinesis. Doppler parameters are consistent   with abnormal left ventricular relaxation (grade 1 diastolic dysfunction). - Ventricular septum: Septal motion showed abnormal function and dyssynergy. - Aortic valve: There was mild  regurgitation. - Mitral valve: There was mild regurgitation. - Left atrium: The atrium was moderately dilated. - Right atrium: The atrium was mildly dilated. - Pericardium, extracardiac: A trivial pericardial effusion was identified along the right ventricular free wall.   Subjective: Pt says he feels better today.  He has no chest pain this morning and has no SOB.  He would like to go home.    Discharge Exam: Vitals:   03/05/16 2004 03/06/16 0515  BP: 138/69 (!) 154/88  Pulse: 99 93  Resp: 18 18  Temp: 98.1 F (36.7 C) 97.5 F (36.4 C)   Vitals:   03/05/16 1321 03/05/16 1646 03/05/16 2004 03/06/16 0515  BP: (!) 170/96 132/84 138/69 (!) 154/88  Pulse: 98 (!) 101 99 93  Resp:  18 18 18   Temp:  98.1 F (36.7 C) 98.1 F (36.7 C) 97.5 F (36.4 C)  TempSrc:  Oral Oral Oral  SpO2:  98% 98% 100%  Weight:    135.7 kg (299 lb 1.6 oz)  Height:        General: Pt is alert, awake, not in acute distress Cardiovascular: RRR, S1/S2 +, no rubs, no gallops Respiratory: CTA bilaterally, no wheezing, no rhonchi Abdominal: Soft, NT, ND, bowel sounds + Extremities: no edema, no cyanosis  The results of significant diagnostics from this hospitalization (including imaging, microbiology, ancillary and laboratory) are listed below for reference.     Microbiology: No results found for this or any previous visit (from the past 240 hour(s)).   Labs: BNP (last 3 results)  Recent Labs  03/04/16 1903  BNP 317.5*   Basic Metabolic Panel:  Recent Labs Lab 03/04/16 1819  NA 140  K 3.9  CL 107  CO2 22  GLUCOSE 98  BUN 14  CREATININE 1.09  CALCIUM 9.3   Liver Function Tests:  Recent Labs Lab 03/04/16 1819  AST 42*  ALT 32  ALKPHOS 59  BILITOT 0.5  PROT 8.4*  ALBUMIN 4.2   No results for input(s): LIPASE, AMYLASE in the last 168 hours. No results for input(s): AMMONIA in the last 168 hours. CBC:  Recent Labs Lab 03/04/16 1819 03/05/16 0127 03/06/16 0404  WBC 5.7  8.5 4.9  NEUTROABS 3.8  --   --   HGB 12.0* 11.7* 11.6*  HCT 36.1* 35.7* 35.3*  MCV 90.5 89.5 90.1  PLT 286 291 259   Cardiac Enzymes:  Recent Labs Lab 03/04/16 1819 03/05/16 0127 03/05/16 0701 03/05/16 1251  TROPONINI 0.05* 0.05* 0.04* 0.04*   BNP: Invalid input(s): POCBNP CBG:  Recent Labs Lab 03/05/16 0630 03/05/16 1142 03/05/16 1644 03/05/16 2124 03/06/16 0632  GLUCAP 121* 127* 111* 110* 102*   D-Dimer No results for input(s): DDIMER in the last 72  hours. Hgb A1c  Recent Labs  03/05/16 0348  HGBA1C 6.1*   Lipid Profile  Recent Labs  03/05/16 0348  CHOL 215*  HDL 58  LDLCALC 109*  TRIG 241*  CHOLHDL 3.7   Thyroid function studies  Recent Labs  03/05/16 0348  TSH 2.190   Anemia work up No results for input(s): VITAMINB12, FOLATE, FERRITIN, TIBC, IRON, RETICCTPCT in the last 72 hours. Urinalysis No results found for: COLORURINE, APPEARANCEUR, LABSPEC, PHURINE, GLUCOSEU, HGBUR, BILIRUBINUR, KETONESUR, PROTEINUR, UROBILINOGEN, NITRITE, LEUKOCYTESUR Sepsis Labs Invalid input(s): PROCALCITONIN,  WBC,  LACTICIDVEN Microbiology No results found for this or any previous visit (from the past 240 hour(s)).  Time coordinating discharge: 32 minutes  SIGNED:  Standley Dakins, MD  [facility] 03/06/2016, 8:59 AM Pager   If 7PM-7AM, please contact night-coverage [email or web address] Password [email or web address]

## 2016-08-09 DIAGNOSIS — Z7289 Other problems related to lifestyle: Secondary | ICD-10-CM | POA: Insufficient documentation

## 2016-08-09 DIAGNOSIS — Z789 Other specified health status: Secondary | ICD-10-CM | POA: Insufficient documentation

## 2016-08-09 DIAGNOSIS — Z8711 Personal history of peptic ulcer disease: Secondary | ICD-10-CM | POA: Insufficient documentation

## 2016-08-09 DIAGNOSIS — Z9119 Patient's noncompliance with other medical treatment and regimen: Secondary | ICD-10-CM | POA: Insufficient documentation

## 2016-08-09 DIAGNOSIS — F1090 Alcohol use, unspecified, uncomplicated: Secondary | ICD-10-CM | POA: Insufficient documentation

## 2016-08-09 DIAGNOSIS — Z91199 Patient's noncompliance with other medical treatment and regimen due to unspecified reason: Secondary | ICD-10-CM | POA: Insufficient documentation

## 2016-08-09 DIAGNOSIS — Z8719 Personal history of other diseases of the digestive system: Secondary | ICD-10-CM | POA: Insufficient documentation

## 2016-08-09 DIAGNOSIS — N179 Acute kidney failure, unspecified: Secondary | ICD-10-CM | POA: Diagnosis present

## 2016-08-10 DIAGNOSIS — I10 Essential (primary) hypertension: Secondary | ICD-10-CM | POA: Insufficient documentation

## 2016-08-10 DIAGNOSIS — M10362 Gout due to renal impairment, left knee: Secondary | ICD-10-CM | POA: Insufficient documentation

## 2016-09-24 ENCOUNTER — Telehealth: Payer: Self-pay | Admitting: Cardiovascular Disease

## 2016-09-24 NOTE — Telephone Encounter (Signed)
Received records from [facility] for appointment on 10/06/16 with Dr Allyson Sabal.  Records put with Dr Hazle Coca schedule for 10/06/16. lp

## 2016-10-01 IMAGING — CR DG CHEST 2V
2 series · 2 of 2 positions shown · non-contrast
Comparison: 09/20/2014

CLINICAL DATA: Shortness of breath for the past couple of days.

EXAM:
CHEST  2 VIEW

[w chest pa]
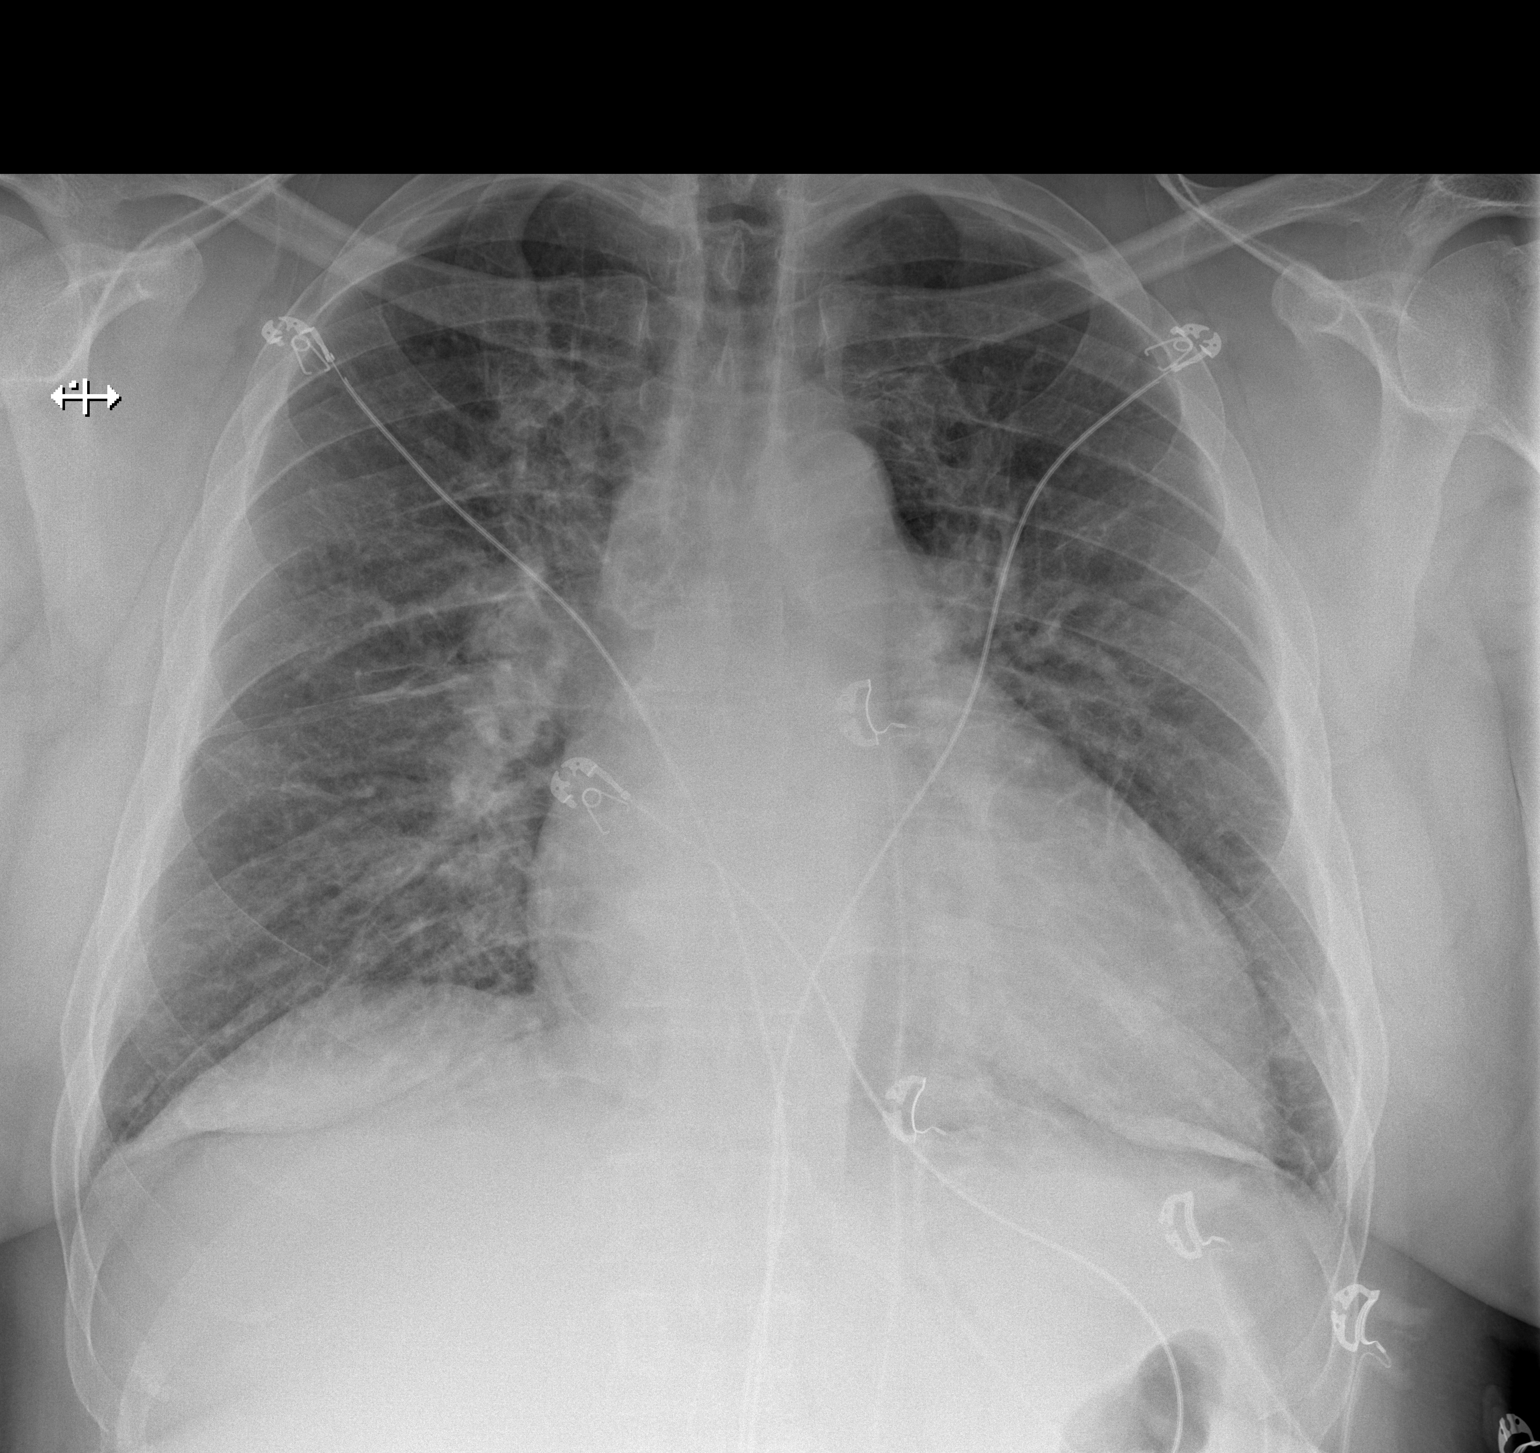

[w chest lat]
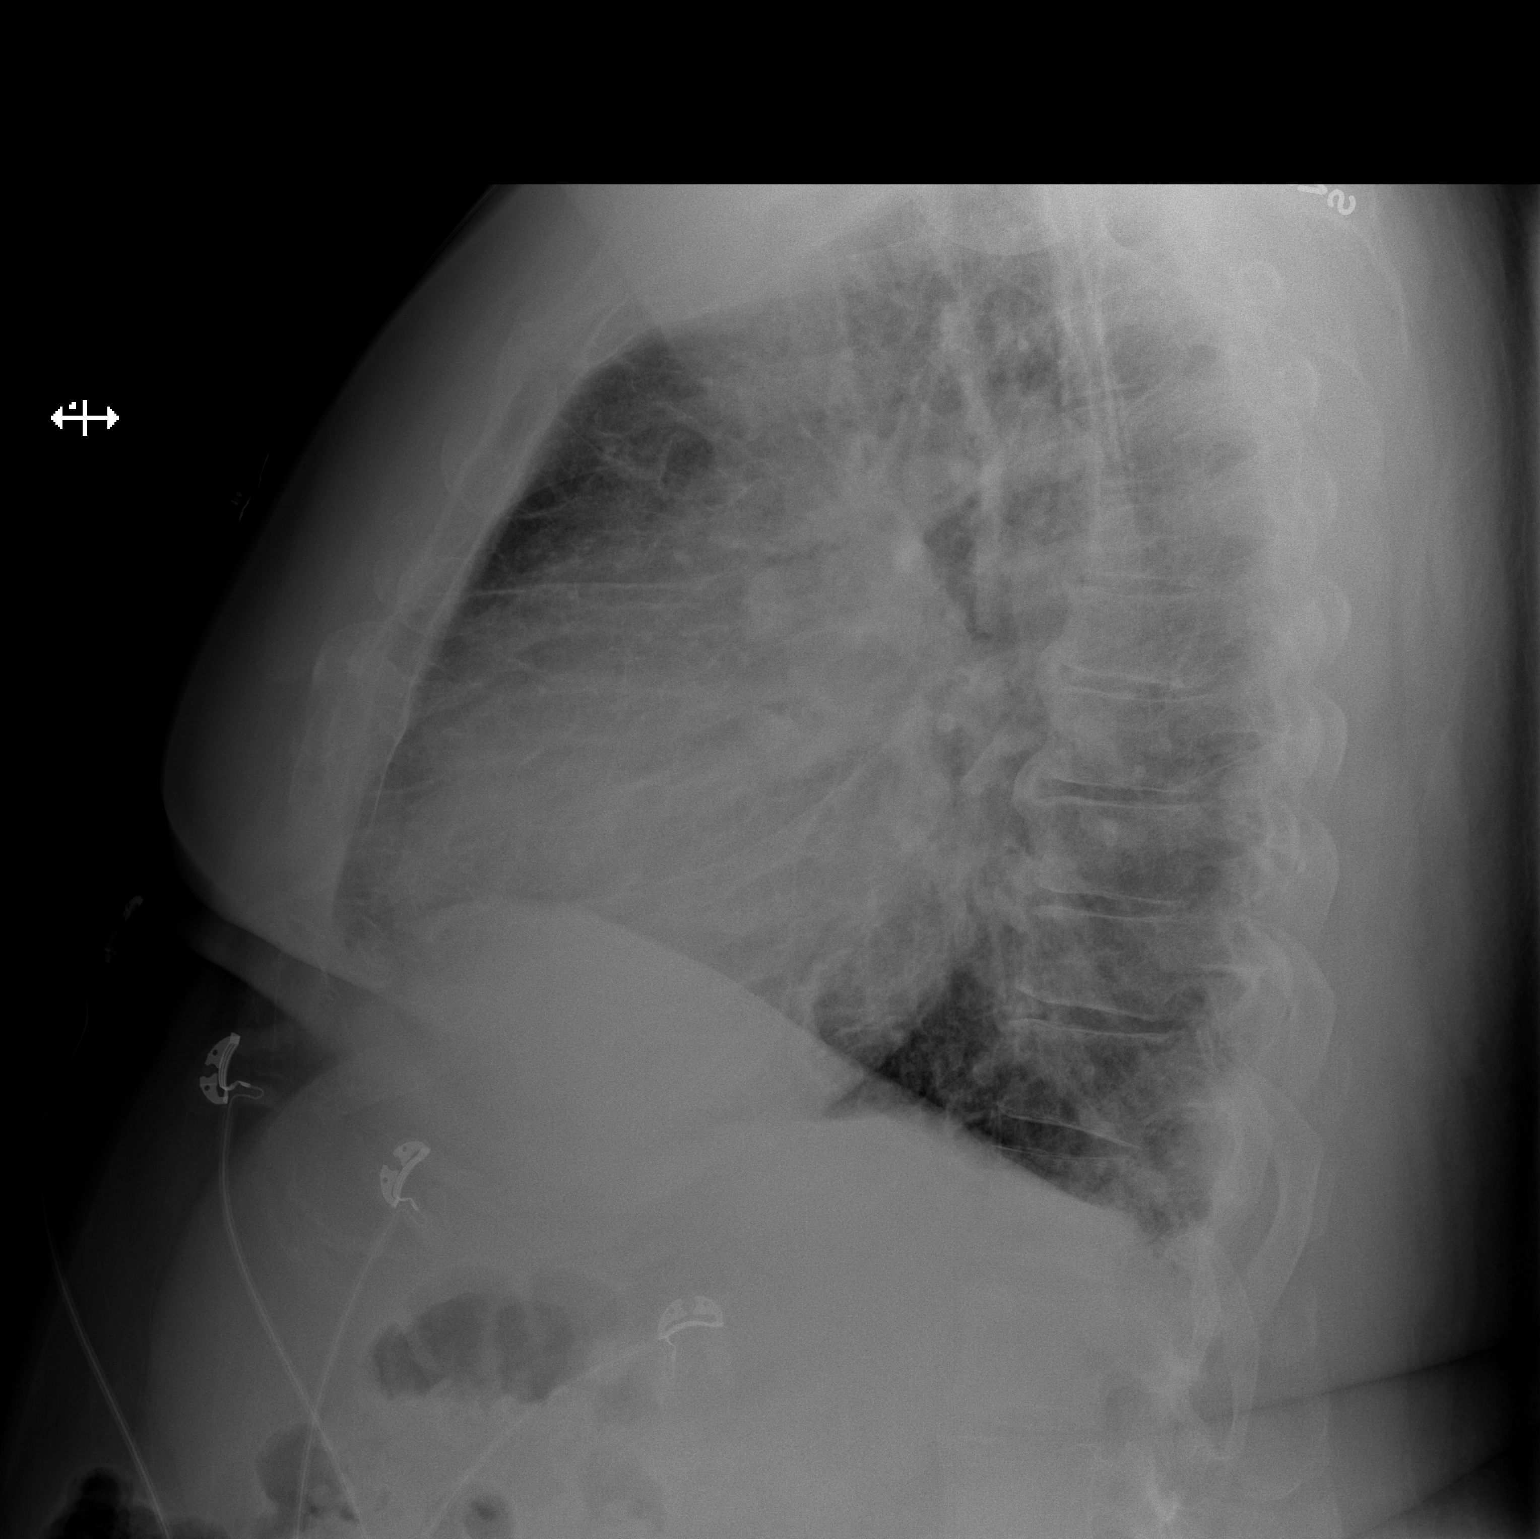

[2 of 2 positions shown; findings below may reference images not displayed]

FINDINGS: Again noted are prominent central vascular structures. Heart size is
mildly enlarged but unchanged. The trachea is midline. Degenerative
changes in the thoracic spine. No large pleural effusions.
IMPRESSION: Prominent central vascular structures may represent vascular
congestion. No focal airspace disease.

Stable mild cardiomegaly.

## 2016-10-06 ENCOUNTER — Encounter: Payer: Self-pay | Admitting: Cardiovascular Disease

## 2016-10-06 ENCOUNTER — Ambulatory Visit (INDEPENDENT_AMBULATORY_CARE_PROVIDER_SITE_OTHER): Payer: Medicare HMO | Admitting: Cardiovascular Disease

## 2016-10-06 VITALS — BP 122/74 | HR 77 | Ht 71.0 in | Wt 275.0 lb

## 2016-10-06 DIAGNOSIS — I10 Essential (primary) hypertension: Secondary | ICD-10-CM

## 2016-10-06 DIAGNOSIS — I5022 Chronic systolic (congestive) heart failure: Secondary | ICD-10-CM | POA: Diagnosis not present

## 2016-10-06 DIAGNOSIS — I340 Nonrheumatic mitral (valve) insufficiency: Secondary | ICD-10-CM | POA: Diagnosis not present

## 2016-10-06 DIAGNOSIS — E7800 Pure hypercholesterolemia, unspecified: Secondary | ICD-10-CM | POA: Diagnosis not present

## 2016-10-06 NOTE — Patient Instructions (Addendum)

## 2016-10-06 NOTE — Assessment & Plan Note (Signed)
History of hyperlipidemia not on statin therapy followed by his PCP 

## 2016-10-06 NOTE — Assessment & Plan Note (Addendum)
History of essential hypertension blood pressure measurement 122/74. He is on carvedilol, clonidine, . Continue current meds at current dosing

## 2016-10-06 NOTE — Assessment & Plan Note (Signed)
History of typical atrial flutter rate controlled on Eliquis  oral anti-regulation

## 2016-10-06 NOTE — Progress Notes (Signed)
10/06/2016 [name]   01-16-1961  [id number]  Primary Physician Karle Plumber, MD Primary Cardiologist: Runell Gess MD Nicholes Calamity, [place]  HPI:  Isaac Valdez is a 56 y.o. male married, father of 2, grandfather and 6 grandchildren referred by Arnette Felts PA-C for evaluation of severe mitral regurgitation. He has a history of treated hypertension, hyperlipidemia and non-insulin requiring diabetes. He does have typical atrial flutter on oral anticoagulation as well as moderate LV dysfunction on appropriate medications. He has a history of 30-pack-years of tobacco having quit 5 years ago as well as recently discontinued ethanol use. Never had a heart attack or stroke. He was recently placed on spironolactone resulted in improved breathing and edema. Apparently a 2-D echo showed severe MR although one performed in December of last year showed an EF of 45-50% with only mild MR.   Current Meds  Medication Sig  . allopurinol (ZYLOPRIM) 100 MG tablet Take 100 mg by mouth daily as needed (for gout).   Marland Kitchen apixaban (ELIQUIS) 5 MG TABS tablet Take 5 mg by mouth 2 (two) times daily.  . carvedilol (COREG) 6.25 MG tablet Take 1 tablet by mouth 2 (two) times daily.  . cloNIDine (CATAPRES) 0.1 MG tablet Take 0.1 mg by mouth daily.  Marland Kitchen esomeprazole (NEXIUM) 40 MG capsule Take 1 capsule by mouth daily.  Marland Kitchen glipiZIDE (GLUCOTROL) 5 MG tablet Take 5 mg by mouth daily.  . sacubitril-valsartan (ENTRESTO) 24-26 MG Take 1 tablet by mouth 2 (two) times daily.  Marland Kitchen spironolactone (ALDACTONE) 25 MG tablet Take 25 mg by mouth 2 (two) times daily.  . [DISCONTINUED] labetalol (NORMODYNE) 200 MG tablet Take 200 mg by mouth 2 (two) times daily.      Allergies  Allergen Reactions  . Aspirin Anaphylaxis and Other (See Comments)    "indigestion"    Social History   Social History  . Marital status: Married    Spouse name: N/A  . Number of children: N/A  . Years of education: N/A    Occupational History  . Not on file.   Social History Main Topics  . Smoking status: Former Smoker    Packs/day: 0.50  . Smokeless tobacco: Never Used  . Alcohol use Yes     Comment: rarely  . Drug use: No  . Sexual activity: Not on file   Other Topics Concern  . Not on file   Social History Narrative  . No narrative on file     Review of Systems: General: negative for chills, fever, night sweats or weight changes.  Cardiovascular: negative for chest pain, dyspnea on exertion, edema, orthopnea, palpitations, paroxysmal nocturnal dyspnea or shortness of breath Dermatological: negative for rash Respiratory: negative for cough or wheezing Urologic: negative for hematuria Abdominal: negative for nausea, vomiting, diarrhea, bright red blood per rectum, melena, or hematemesis Neurologic: negative for visual changes, syncope, or dizziness All other systems reviewed and are otherwise negative except as noted above.    Blood pressure 122/74, pulse 77, height 5\' 11"  (1.803 m), weight 275 lb (124.7 kg).  General appearance: alert and no distress Neck: no adenopathy, no carotid bruit, no JVD, supple, symmetrical, trachea midline and thyroid not enlarged, symmetric, no tenderness/mass/nodules Lungs: clear to auscultation bilaterally Heart: irregularly irregular rhythm Extremities: extremities normal, atraumatic, no cyanosis or edema  EKG atrial flutter with rapid ventricular response of 77 and nonspecific ST and T-wave changes. I personally reviewed this EKG  ASSESSMENT AND PLAN:   Atrial flutter (HCC)  History of typical atrial flutter rate controlled on Eliquis  oral anti-regulation  Hypertension History of essential hypertension blood pressure measurement 122/74. He is on carvedilol, clonidine, . Continue current meds at current dosing  High cholesterol History of hyperlipidemia not on statin therapy followed by his PCP  Chronic systolic heart failure (HCC) History of  chronic systolic heart failure on appropriate medications with 2-D echo performed 02/2516 revealing an EF of 45-50%. He is on Entresto. Since being put on spironolactone to his edema has improved as has his shortness of breath.  Moderate to severe mitral regurgitation History of moderate to severe mitral regurgitation. The patient was referred to me by Arnette Felts PA-C  because of this. He had mild MR by 2-D echo December of last year. I cannot hear a murmur today. We will recheck a 2-D echocardiogram      Runell Gess MD [facility], [facility] 10/06/2016 11:20 AM

## 2016-10-06 NOTE — Assessment & Plan Note (Signed)
History of chronic systolic heart failure on appropriate medications with 2-D echo performed 02/2516 revealing an EF of 45-50%. He is on Entresto. Since being put on spironolactone to his edema has improved as has his shortness of breath.

## 2016-10-06 NOTE — Assessment & Plan Note (Signed)
History of moderate to severe mitral regurgitation. The patient was referred to me by Arnette Felts PA-C  because of this. He had mild MR by 2-D echo December of last year. I cannot hear a murmur today. We will recheck a 2-D echocardiogram

## 2016-10-19 ENCOUNTER — Ambulatory Visit (HOSPITAL_COMMUNITY): Payer: Medicare HMO | Attending: Cardiovascular Disease

## 2016-10-19 ENCOUNTER — Other Ambulatory Visit: Payer: Self-pay

## 2016-10-19 DIAGNOSIS — E785 Hyperlipidemia, unspecified: Secondary | ICD-10-CM | POA: Diagnosis not present

## 2016-10-19 DIAGNOSIS — I10 Essential (primary) hypertension: Secondary | ICD-10-CM | POA: Diagnosis not present

## 2016-10-19 DIAGNOSIS — I081 Rheumatic disorders of both mitral and tricuspid valves: Secondary | ICD-10-CM | POA: Insufficient documentation

## 2016-10-19 DIAGNOSIS — I4891 Unspecified atrial fibrillation: Secondary | ICD-10-CM | POA: Diagnosis not present

## 2016-10-19 DIAGNOSIS — I340 Nonrheumatic mitral (valve) insufficiency: Secondary | ICD-10-CM

## 2016-10-19 DIAGNOSIS — Z87891 Personal history of nicotine dependence: Secondary | ICD-10-CM | POA: Diagnosis not present

## 2016-11-20 ENCOUNTER — Encounter: Payer: Self-pay | Admitting: Cardiovascular Disease

## 2016-11-20 ENCOUNTER — Ambulatory Visit (INDEPENDENT_AMBULATORY_CARE_PROVIDER_SITE_OTHER): Payer: Medicare HMO | Admitting: Cardiovascular Disease

## 2016-11-20 VITALS — BP 116/71 | HR 64 | Ht 71.0 in | Wt 285.8 lb

## 2016-11-20 DIAGNOSIS — I340 Nonrheumatic mitral (valve) insufficiency: Secondary | ICD-10-CM | POA: Diagnosis not present

## 2016-11-20 NOTE — Assessment & Plan Note (Signed)
History of severe mitral regurgitation and LV dysfunction. His last echo was performed in December of last year and August of this year showing mild MR however his EF in December was 45-50% and on 10/19/16 was 30-35% for unclear reasons. We did talk about the importance of salt restriction. He is on appropriate medication for heart failure. I'm going to recheck a 2-D echo in 3 months. We did talk about the possibility of ICD implantation for primary prevention of sudden cardiac death.

## 2016-11-20 NOTE — Assessment & Plan Note (Signed)
History of hyperlipidemia not on statin therapy followed by his PCP 

## 2016-11-20 NOTE — Patient Instructions (Signed)
Medication Instructions:  STOP CLONIDINE   Labwork: NOE  Testing/Procedures: Your physician has requested that you have an echocardiogram. Echocardiography is a painless test that uses sound waves to create images of your heart. It provides your doctor with information about the size and shape of your heart and how well your heart's chambers and valves are working. This procedure takes approximately one hour. There are no restrictions for this procedure. 3 MONTHS AT [facility] AT [address]  Follow-Up: Your physician recommends that you schedule a follow-up appointment in: 3 MONTHS AFTER ECHO   If you need a refill on your cardiac medications before your next appointment, please call your pharmacy.

## 2016-11-20 NOTE — Assessment & Plan Note (Signed)
History of atrial flutter rate controlled on Eliquis oral anticoagulation. 

## 2016-11-20 NOTE — Assessment & Plan Note (Signed)
History of essential hypertension blood pressure measured 116/71. He is on carvedilol, clonidine and Entresto 24/26. He feels somewhat orthostatic. I am going to discontinue his clonidine.

## 2016-11-20 NOTE — Progress Notes (Signed)
11/20/2016 Isaac Valdez   25-Oct-1960  [id number]  Primary Physician Karle Plumber, MD Primary Cardiologist: Runell Gess MD [facility] [place]am[place], [place]  HPI:  Isaac Valdez is a 56 y.o. male , father of 2, grandfather and 6 grandchildren referred by Arnette Felts PA-C for evaluation of severe mitral regurgitation. He is accompanied by his wife Isaac Valdez today. I last saw him in the office 10/06/16. He has a history of treated hypertension, hyperlipidemia and non-insulin requiring diabetes. He does have typical atrial flutter on oral anticoagulation as well as moderate LV dysfunction on appropriate medications. He has a history of 30-pack-years of tobacco having quit 5 years ago as well as recently discontinued ethanol use. Never had a heart attack or stroke. He was recently placed on spironolactone resulted in improved breathing and edema. Apparently a 2-D echo showed severe MR although one performed in December of last year showed an EF of 45-50% with only mild MR. His most recent 2-D echo performed 10/19/16 revealed only mild MR with severe LV dysfunction and EF of 30-35% which has declined since his echo performed back in December which time his EF was 45-50%. He does exercise the gym. He didn't denies chest pain or shortness of breath.   Current Meds  Medication Sig  . allopurinol (ZYLOPRIM) 100 MG tablet Take 100 mg by mouth daily as needed (for gout).   Marland Kitchen apixaban (ELIQUIS) 5 MG TABS tablet Take 5 mg by mouth 2 (two) times daily.  . carvedilol (COREG) 6.25 MG tablet Take 1 tablet by mouth 2 (two) times daily.  Marland Kitchen esomeprazole (NEXIUM) 40 MG capsule Take 1 capsule by mouth daily.  Marland Kitchen glipiZIDE (GLUCOTROL) 5 MG tablet Take 5 mg by mouth daily.  . sacubitril-valsartan (ENTRESTO) 24-26 MG Take 1 tablet by mouth 2 (two) times daily.  Marland Kitchen spironolactone (ALDACTONE) 25 MG tablet Take 25 mg by mouth 2 (two) times daily.  . [DISCONTINUED] cloNIDine (CATAPRES) 0.1 MG tablet Take 0.1 mg by mouth  daily.     Allergies  Allergen Reactions  . Aspirin Anaphylaxis and Other (See Comments)    "indigestion"    Social History   Social History  . Marital status: Married    Spouse name: N/A  . Number of children: N/A  . Years of education: N/A   Occupational History  . Not on file.   Social History Main Topics  . Smoking status: Former Smoker    Packs/day: 0.50  . Smokeless tobacco: Never Used  . Alcohol use Yes     Comment: rarely  . Drug use: No  . Sexual activity: Not on file   Other Topics Concern  . Not on file   Social History Narrative  . No narrative on file     Review of Systems: General: negative for chills, fever, night sweats or weight changes.  Cardiovascular: negative for chest pain, dyspnea on exertion, edema, orthopnea, palpitations, paroxysmal nocturnal dyspnea or shortness of breath Dermatological: negative for rash Respiratory: negative for cough or wheezing Urologic: negative for hematuria Abdominal: negative for nausea, vomiting, diarrhea, bright red blood per rectum, melena, or hematemesis Neurologic: negative for visual changes, syncope, or dizziness All other systems reviewed and are otherwise negative except as noted above.    Blood pressure 116/71, pulse 64, height  (1.803 m), weight 285 lb 12.8 oz (129.6 kg).  General appearance: alert and no distress Neck: no adenopathy, no carotid bruit, no JVD, supple, symmetrical, trachea midline and thyroid not enlarged,  symmetric, no tenderness/mass/nodules Lungs: clear to auscultation bilaterally Heart: regular rate and rhythm, S1, S2 normal, no murmur, click, rub or gallop Extremities: extremities normal, atraumatic, no cyanosis or edema Pulses: 2+ and symmetric Skin: Skin color, texture, turgor normal. No rashes or lesions Neurologic: Alert and oriented X 3, normal strength and tone. Normal symmetric reflexes. Normal coordination and gait  EKG not performed today  ASSESSMENT AND PLAN:    Atrial flutter (HCC) History of atrial flutter rate controlled on Eliquis oral anticoagulation.  Hypertension History of essential hypertension blood pressure measured 116/71. He is on carvedilol, clonidine and Entresto 24/26. He feels somewhat orthostatic. I am going to discontinue his clonidine.  High cholesterol History of hyperlipidemia not on statin therapy followed by his PCP  Chronic systolic heart failure (HCC) History of severe mitral regurgitation and LV dysfunction. His last echo was performed in December of last year and August of this year showing mild MR however his EF in December was 45-50% and on 10/19/16 was 30-35% for unclear reasons. We did talk about the importance of salt restriction. He is on appropriate medication for heart failure. I'm going to recheck a 2-D echo in 3 months. We did talk about the possibility of ICD implantation for primary prevention of sudden cardiac death.      Runell Gess MD FACP,FACC,FAHA, [facility] 11/20/2016 3:07 PM

## 2017-02-19 ENCOUNTER — Other Ambulatory Visit: Payer: Self-pay

## 2017-02-19 ENCOUNTER — Ambulatory Visit (HOSPITAL_COMMUNITY): Payer: Medicare HMO | Attending: Internal Medicine

## 2017-02-19 DIAGNOSIS — E785 Hyperlipidemia, unspecified: Secondary | ICD-10-CM | POA: Insufficient documentation

## 2017-02-19 DIAGNOSIS — I340 Nonrheumatic mitral (valve) insufficiency: Secondary | ICD-10-CM | POA: Insufficient documentation

## 2017-02-19 DIAGNOSIS — I10 Essential (primary) hypertension: Secondary | ICD-10-CM | POA: Insufficient documentation

## 2017-02-19 DIAGNOSIS — E669 Obesity, unspecified: Secondary | ICD-10-CM | POA: Insufficient documentation

## 2017-02-19 DIAGNOSIS — I4892 Unspecified atrial flutter: Secondary | ICD-10-CM | POA: Diagnosis not present

## 2017-02-19 DIAGNOSIS — I351 Nonrheumatic aortic (valve) insufficiency: Secondary | ICD-10-CM | POA: Insufficient documentation

## 2017-02-19 DIAGNOSIS — E119 Type 2 diabetes mellitus without complications: Secondary | ICD-10-CM | POA: Insufficient documentation

## 2017-02-19 MED ORDER — PERFLUTREN LIPID MICROSPHERE
1.0000 mL | INTRAVENOUS | Status: AC | PRN
  Administered 2017-02-19: 2 mL via INTRAVENOUS

## 2017-02-24 ENCOUNTER — Ambulatory Visit: Payer: Medicare HMO | Admitting: Cardiovascular Disease

## 2017-03-10 ENCOUNTER — Ambulatory Visit: Payer: Medicare HMO | Admitting: Cardiovascular Disease

## 2017-03-16 ENCOUNTER — Ambulatory Visit: Payer: Medicare HMO | Admitting: Cardiovascular Disease

## 2017-03-16 ENCOUNTER — Encounter: Payer: Self-pay | Admitting: Cardiovascular Disease

## 2017-03-16 VITALS — BP 132/76 | HR 83 | Ht 72.0 in | Wt 318.4 lb

## 2017-03-16 DIAGNOSIS — I5022 Chronic systolic (congestive) heart failure: Secondary | ICD-10-CM | POA: Diagnosis not present

## 2017-03-16 DIAGNOSIS — I10 Essential (primary) hypertension: Secondary | ICD-10-CM | POA: Diagnosis not present

## 2017-03-16 DIAGNOSIS — E7800 Pure hypercholesterolemia, unspecified: Secondary | ICD-10-CM

## 2017-03-16 DIAGNOSIS — I483 Typical atrial flutter: Secondary | ICD-10-CM | POA: Diagnosis not present

## 2017-03-16 NOTE — Assessment & Plan Note (Signed)
History of essential hypertension blood pressures measured at 132/76. He is on carvedilol. Continue current meds current dosing.

## 2017-03-16 NOTE — Assessment & Plan Note (Signed)
History of hyperlipidemia not on statin therapy followed by his PCP 

## 2017-03-16 NOTE — Assessment & Plan Note (Signed)
Mr. Schnur remains in atrial flutter with a controlled ventricular response on carvedilol and liquids. He does have class 2-3 symptoms of heart failure. He may benefit from restoration of sinus rhythm. Recent 2-D echocardiogram performed 02/19/17 revealed a slight improvement in his EF of 45-50% with a normal sized left atrium. I'm going to refer him to Dr. Elberta Fortis for consideration of attempted cardioversion versus atrial flutter ablation.

## 2017-03-16 NOTE — Patient Instructions (Signed)
Medication Instructions: Your physician recommends that you continue on your current medications as directed. Please refer to the Current Medication list given to you today.   Follow-Up: You have been referred to Dr. Elberta Fortis (EP) for evaluation of Atrial Flutter Ablation.  You have been referred to the Heart Failure Clinic.  Your physician wants you to follow-up in: 6 months with Dr. Allyson Sabal. You will receive a reminder letter in the mail two months in advance. If you don't receive a letter, please call our office to schedule the follow-up appointment.  If you need a refill on your cardiac medications before your next appointment, please call your pharmacy.

## 2017-03-16 NOTE — Progress Notes (Signed)
03/16/2017 Isaac Valdez   1961/01/27  [id number]  Primary Physician Isaac Plumber, MD Primary Cardiologist: Isaac Gess MD Isaac Valdez, [place]  HPI:  Isaac Valdez is a 57 y.o.  father of 2, grandfather and 6 grandchildren referred by Isaac Felts PA-C for evaluation of severe mitral regurgitation.I last saw him in the office 11/20/16 . He has a history of treated hypertension, hyperlipidemia and non-insulin requiring diabetes. He does have typical atrial flutter on oral anticoagulation as well as moderate LV dysfunction on appropriate medications. He has a history of 30-pack-years of tobacco having quit 5 years ago as well as recently discontinued ethanol use. Never had a heart attack or stroke. He was recently placed on spironolactone resulted in improved breathing and edema. Apparently a 2-D echo showed severe MR although one performed in December of last year showed an EF of 45-50% with only mild MR. His most recent 2-D echo performed 02/19/17 revealed only mild MR with return of LV function up to 45-50% from 30-35% he has gained 40 pounds over the last 6 months. He is on torsemide and spironolactone and has no peripheral edema. He complains of increasing dyspnea on exertion and decreasing exercise tolerance.   Current Meds  Medication Sig  . allopurinol (ZYLOPRIM) 100 MG tablet Take 100 mg by mouth daily as needed (for gout).   Marland Kitchen apixaban (ELIQUIS) 5 MG TABS tablet Take 5 mg by mouth 2 (two) times daily.  . carvedilol (COREG) 6.25 MG tablet Take 1 tablet by mouth 2 (two) times daily.  Marland Kitchen esomeprazole (NEXIUM) 40 MG capsule Take 1 capsule by mouth daily.  Marland Kitchen glipiZIDE (GLUCOTROL) 5 MG tablet Take 5 mg by mouth daily.  . sacubitril-valsartan (ENTRESTO) 24-26 MG Take 1 tablet by mouth 2 (two) times daily.  Marland Kitchen spironolactone (ALDACTONE) 25 MG tablet Take 25 mg by mouth 2 (two) times daily.     Allergies  Allergen Reactions  . Aspirin Anaphylaxis and Other (See Comments)    "indigestion"    Social History   Socioeconomic History  . Marital status: Married    Spouse name: Not on file  . Number of children: Not on file  . Years of education: Not on file  . Highest education level: Not on file  Social Needs  . Financial resource strain: Not on file  . Food insecurity - worry: Not on file  . Food insecurity - inability: Not on file  . Transportation needs - medical: Not on file  . Transportation needs - non-medical: Not on file  Occupational History  . Not on file  Tobacco Use  . Smoking status: Former Smoker    Packs/day: 0.50  . Smokeless tobacco: Never Used  Substance and Sexual Activity  . Alcohol use: Yes    Comment: rarely  . Drug use: No  . Sexual activity: Not on file  Other Topics Concern  . Not on file  Social History Narrative  . Not on file     Review of Systems: General: negative for chills, fever, night sweats or weight changes.  Cardiovascular: negative for chest pain, dyspnea on exertion, edema, orthopnea, palpitations, paroxysmal nocturnal dyspnea or shortness of breath Dermatological: negative for rash Respiratory: negative for cough or wheezing Urologic: negative for hematuria Abdominal: negative for nausea, vomiting, diarrhea, bright red blood per rectum, melena, or hematemesis Neurologic: negative for visual changes, syncope, or dizziness All other systems reviewed and are otherwise negative except as noted above.    Blood pressure  132/76, pulse 83, height 6' (1.829 m), weight (!) 318 lb 6.4 oz (144.4 kg).  General appearance: alert and no distress Neck: no adenopathy, no carotid bruit, no JVD, supple, symmetrical, trachea midline and thyroid not enlarged, symmetric, no tenderness/mass/nodules Lungs: clear to auscultation bilaterally Heart: irregularly irregular rhythm Extremities: extremities normal, atraumatic, no cyanosis or edema Pulses: 2+ and symmetric Skin: Skin color, texture, turgor normal. No rashes or  lesions Neurologic: Alert and oriented X 3, normal strength and tone. Normal symmetric reflexes. Normal coordination and gait  EKG atrial flutter with variable block and a heart rate of 83. I personally reviewed this EKG  ASSESSMENT AND PLAN:   Atrial flutter [facility]) Mr. Isaac Valdez remains in atrial flutter with a controlled ventricular response on carvedilol and liquids. He does have class 2-3 symptoms of heart failure. He may benefit from restoration of sinus rhythm. Recent 2-D echocardiogram performed 02/19/17 revealed a slight improvement in his EF of 45-50% with a normal sized left atrium. I'm going to refer him to Dr. Elberta Valdez for consideration of attempted cardioversion versus atrial flutter ablation.  Hypertension History of essential hypertension blood pressures measured at 132/76. He is on carvedilol. Continue current meds current dosing.  High cholesterol History of hyperlipidemia not on statin therapy followed by his PCP  Chronic systolic heart failure (HCC) History of systolic heart failure with a recent echo performed 02/19/17 revealed mild improvement in his EF to 45-50%. He has minimal MR and normal left atrial size on appropriate medications including carvedilol, spironolactone and Entresto he has continued to gain weight. He has no peripheral edema. He is symptomatic with class III symptoms. I'm going to refer him to the heart failure clinic for further evaluation and treatment.Isaac Gess MD FACP,FACC,FAHA, [facility] 03/16/2017 8:45 AM

## 2017-03-16 NOTE — Assessment & Plan Note (Signed)
History of systolic heart failure with a recent echo performed 02/19/17 revealed mild improvement in his EF to 45-50%. He has minimal MR and normal left atrial size on appropriate medications including carvedilol, spironolactone and Entresto he has continued to gain weight. He has no peripheral edema. He is symptomatic with class III symptoms. I'm going to refer him to the heart failure clinic for further evaluation and treatment.Isaac Valdez

## 2017-03-19 ENCOUNTER — Other Ambulatory Visit: Payer: Self-pay

## 2017-03-19 ENCOUNTER — Emergency Department (HOSPITAL_COMMUNITY): Payer: Medicare HMO

## 2017-03-19 ENCOUNTER — Observation Stay (HOSPITAL_COMMUNITY)
Admission: EM | Admit: 2017-03-19 | Discharge: 2017-03-20 | Disposition: A | Discharge status: Home / Self Care | Payer: Medicare HMO | Attending: Internal Medicine | Admitting: Internal Medicine

## 2017-03-19 ENCOUNTER — Encounter (HOSPITAL_COMMUNITY): Payer: Self-pay | Admitting: Emergency Medicine

## 2017-03-19 DIAGNOSIS — D72829 Elevated white blood cell count, unspecified: Secondary | ICD-10-CM | POA: Diagnosis not present

## 2017-03-19 DIAGNOSIS — I483 Typical atrial flutter: Secondary | ICD-10-CM | POA: Diagnosis not present

## 2017-03-19 DIAGNOSIS — Z87891 Personal history of nicotine dependence: Secondary | ICD-10-CM | POA: Diagnosis not present

## 2017-03-19 DIAGNOSIS — I10 Essential (primary) hypertension: Secondary | ICD-10-CM | POA: Diagnosis not present

## 2017-03-19 DIAGNOSIS — Z7984 Long term (current) use of oral hypoglycemic drugs: Secondary | ICD-10-CM | POA: Diagnosis not present

## 2017-03-19 DIAGNOSIS — N289 Disorder of kidney and ureter, unspecified: Secondary | ICD-10-CM | POA: Insufficient documentation

## 2017-03-19 DIAGNOSIS — Z7901 Long term (current) use of anticoagulants: Secondary | ICD-10-CM | POA: Diagnosis not present

## 2017-03-19 DIAGNOSIS — Z886 Allergy status to analgesic agent status: Secondary | ICD-10-CM | POA: Insufficient documentation

## 2017-03-19 DIAGNOSIS — I110 Hypertensive heart disease with heart failure: Secondary | ICD-10-CM | POA: Insufficient documentation

## 2017-03-19 DIAGNOSIS — E1129 Type 2 diabetes mellitus with other diabetic kidney complication: Secondary | ICD-10-CM | POA: Diagnosis not present

## 2017-03-19 DIAGNOSIS — Z6841 Body Mass Index (BMI) 40.0 and over, adult: Secondary | ICD-10-CM | POA: Diagnosis not present

## 2017-03-19 DIAGNOSIS — K219 Gastro-esophageal reflux disease without esophagitis: Secondary | ICD-10-CM | POA: Diagnosis not present

## 2017-03-19 DIAGNOSIS — I4892 Unspecified atrial flutter: Secondary | ICD-10-CM | POA: Diagnosis not present

## 2017-03-19 DIAGNOSIS — E7800 Pure hypercholesterolemia, unspecified: Secondary | ICD-10-CM | POA: Insufficient documentation

## 2017-03-19 DIAGNOSIS — I5022 Chronic systolic (congestive) heart failure: Secondary | ICD-10-CM | POA: Diagnosis not present

## 2017-03-19 DIAGNOSIS — E875 Hyperkalemia: Secondary | ICD-10-CM | POA: Diagnosis not present

## 2017-03-19 DIAGNOSIS — Z79899 Other long term (current) drug therapy: Secondary | ICD-10-CM | POA: Diagnosis not present

## 2017-03-19 DIAGNOSIS — M109 Gout, unspecified: Secondary | ICD-10-CM | POA: Diagnosis not present

## 2017-03-19 DIAGNOSIS — R0602 Shortness of breath: Secondary | ICD-10-CM | POA: Diagnosis present

## 2017-03-19 DIAGNOSIS — Z8249 Family history of ischemic heart disease and other diseases of the circulatory system: Secondary | ICD-10-CM | POA: Insufficient documentation

## 2017-03-19 DIAGNOSIS — R110 Nausea: Secondary | ICD-10-CM | POA: Insufficient documentation

## 2017-03-19 DIAGNOSIS — N179 Acute kidney failure, unspecified: Secondary | ICD-10-CM | POA: Diagnosis present

## 2017-03-19 HISTORY — DX: Dyspnea, unspecified: R06.00

## 2017-03-19 LAB — BASIC METABOLIC PANEL
Anion gap: 13 (ref 5–15)
BUN: 75 mg/dL — AB (ref 6–20)
CALCIUM: 9.6 mg/dL (ref 8.9–10.3)
CHLORIDE: 103 mmol/L (ref 101–111)
CO2: 17 mmol/L — ABNORMAL LOW (ref 22–32)
CREATININE: 2.64 mg/dL — AB (ref 0.61–1.24)
GFR calc non Af Amer: 26 mL/min — ABNORMAL LOW (ref >60)
GFR, EST AFRICAN AMERICAN: 30 mL/min — AB (ref >60)
Glucose, Bld: 155 mg/dL — ABNORMAL HIGH (ref 65–99)
Potassium: 5.4 mmol/L — ABNORMAL HIGH (ref 3.5–5.1)
SODIUM: 133 mmol/L — AB (ref 135–145)

## 2017-03-19 LAB — URINALYSIS, ROUTINE W REFLEX MICROSCOPIC
Bilirubin Urine: NEGATIVE
GLUCOSE, UA: NEGATIVE mg/dL
HGB URINE DIPSTICK: NEGATIVE
KETONES UR: NEGATIVE mg/dL
Leukocytes, UA: NEGATIVE
Nitrite: NEGATIVE
PROTEIN: NEGATIVE mg/dL
Specific Gravity, Urine: 1.015 (ref 1.005–1.030)
pH: 5 (ref 5.0–8.0)

## 2017-03-19 LAB — CBC
HCT: 37.9 % — ABNORMAL LOW (ref 39.0–52.0)
Hemoglobin: 12.2 g/dL — ABNORMAL LOW (ref 13.0–17.0)
MCH: 29.6 pg (ref 26.0–34.0)
MCHC: 32.2 g/dL (ref 30.0–36.0)
MCV: 92 fL (ref 78.0–100.0)
PLATELETS: 293 10*3/uL (ref 150–400)
RBC: 4.12 MIL/uL — AB (ref 4.22–5.81)
RDW: 14.2 % (ref 11.5–15.5)
WBC: 14.1 10*3/uL — AB (ref 4.0–10.5)

## 2017-03-19 LAB — CREATININE, URINE, RANDOM: Creatinine, Urine: 148.11 mg/dL

## 2017-03-19 LAB — TROPONIN I

## 2017-03-19 LAB — I-STAT TROPONIN, ED: TROPONIN I, POC: 0 ng/mL (ref 0.00–0.08)

## 2017-03-19 LAB — CBG MONITORING, ED: Glucose-Capillary: 137 mg/dL — ABNORMAL HIGH (ref 65–99)

## 2017-03-19 LAB — GLUCOSE, CAPILLARY: Glucose-Capillary: 150 mg/dL — ABNORMAL HIGH (ref 65–99)

## 2017-03-19 LAB — BRAIN NATRIURETIC PEPTIDE: B NATRIURETIC PEPTIDE 5: 71.7 pg/mL (ref 0.0–100.0)

## 2017-03-19 MED ORDER — SODIUM POLYSTYRENE SULFONATE 15 GM/60ML PO SUSP
30.0000 g | Freq: Once | ORAL | Status: AC
  Administered 2017-03-19: 30 g via ORAL
  Filled 2017-03-19: qty 120

## 2017-03-19 MED ORDER — CARVEDILOL 6.25 MG PO TABS
6.2500 mg | ORAL_TABLET | Freq: Two times a day (BID) | ORAL | Status: DC
  Administered 2017-03-20: 6.25 mg via ORAL
  Filled 2017-03-19 (×2): qty 1

## 2017-03-19 MED ORDER — INSULIN ASPART 100 UNIT/ML ~~LOC~~ SOLN
0.0000 [IU] | Freq: Three times a day (TID) | SUBCUTANEOUS | Status: DC
  Administered 2017-03-20: 2 [IU] via SUBCUTANEOUS

## 2017-03-19 MED ORDER — DM-GUAIFENESIN ER 30-600 MG PO TB12: 1.0000 | ORAL_TABLET | Freq: Two times a day (BID) | ORAL | Status: DC | PRN

## 2017-03-19 MED ORDER — SODIUM CHLORIDE 0.9 % IV SOLN
INTRAVENOUS | Status: DC
  Administered 2017-03-20: via INTRAVENOUS

## 2017-03-19 MED ORDER — ALBUTEROL SULFATE (2.5 MG/3ML) 0.083% IN NEBU: 2.5000 mg | INHALATION_SOLUTION | RESPIRATORY_TRACT | Status: DC | PRN

## 2017-03-19 MED ORDER — HYDRALAZINE HCL 20 MG/ML IJ SOLN: 5.0000 mg | INTRAMUSCULAR | Status: DC | PRN

## 2017-03-19 MED ORDER — ZOLPIDEM TARTRATE 5 MG PO TABS
5.0000 mg | ORAL_TABLET | Freq: Every evening | ORAL | Status: DC | PRN
  Administered 2017-03-20: 5 mg via ORAL
  Filled 2017-03-19: qty 1

## 2017-03-19 MED ORDER — ALLOPURINOL 100 MG PO TABS: 100.0000 mg | ORAL_TABLET | Freq: Every day | ORAL | Status: DC | PRN

## 2017-03-19 MED ORDER — ALBUTEROL SULFATE (2.5 MG/3ML) 0.083% IN NEBU: 5.0000 mg | INHALATION_SOLUTION | Freq: Once | RESPIRATORY_TRACT | Status: DC

## 2017-03-19 MED ORDER — ACETAMINOPHEN 650 MG RE SUPP: 650.0000 mg | Freq: Four times a day (QID) | RECTAL | Status: DC | PRN

## 2017-03-19 MED ORDER — ONDANSETRON HCL 4 MG/2ML IJ SOLN: 4.0000 mg | Freq: Four times a day (QID) | INTRAMUSCULAR | Status: DC | PRN

## 2017-03-19 MED ORDER — PANTOPRAZOLE SODIUM 40 MG PO TBEC
40.0000 mg | DELAYED_RELEASE_TABLET | Freq: Every day | ORAL | Status: DC
  Administered 2017-03-20: 40 mg via ORAL
  Filled 2017-03-19: qty 1

## 2017-03-19 MED ORDER — APIXABAN 5 MG PO TABS
5.0000 mg | ORAL_TABLET | Freq: Two times a day (BID) | ORAL | Status: DC
  Administered 2017-03-19 – 2017-03-20 (×2): 5 mg via ORAL
  Filled 2017-03-19 (×2): qty 1

## 2017-03-19 MED ORDER — ACETAMINOPHEN 325 MG PO TABS: 650.0000 mg | ORAL_TABLET | Freq: Four times a day (QID) | ORAL | Status: DC | PRN

## 2017-03-19 MED ORDER — ONDANSETRON HCL 4 MG PO TABS: 4.0000 mg | ORAL_TABLET | Freq: Four times a day (QID) | ORAL | Status: DC | PRN

## 2017-03-19 NOTE — ED Provider Notes (Signed)
[facility] EMERGENCY DEPARTMENT Provider Note   CSN: [id number] Arrival date & time: 03/19/17  1145     History   Chief Complaint Chief Complaint  Patient presents with  . Shortness of Breath    HPI Isaac Valdez is a 57 y.o. male.  Isaac Valdez is a 57 y.o. Male who presents to the ED complaining of shortness of breath with exertion.  Patient reports his symptoms began around 5 days ago and have gradually worsened.  He reports even with ambulating short distances he feels more short of breath.  He has a history of heart failure and is worried this might be contributing.  He reports he takes torsemide and spironolactone on a daily basis.  He also reports taking 2 shots of medications yesterday for gout at his PCPs office.  He is followed by Dr. Allyson Sabal with cardiology for his heart failure. He denies any changes to his appetite or drinking.  No recent changes to his medications.  He has been eating and drinking normally.  Normal amount of urine output.  He denies any chest pain.  He denies any leg pain or swelling.  He denies fevers, coughing, palpitations, leg pain, leg swelling, urinary symptoms, rashes, chest pain, lightheadedness, dizziness or syncope.   The history is provided by the patient and medical records. No language interpreter was used.  Shortness of Breath  Pertinent negatives include no fever, no headaches, no sore throat, no neck pain, no cough, no wheezing, no chest pain, no vomiting, no abdominal pain, no rash and no leg swelling.    Past Medical History:  Diagnosis Date  . Back pain   . CHF (congestive heart failure) (HCC)   . Diabetes mellitus   . Gout   . High cholesterol   . Hypertension     Patient Active Problem List   Diagnosis Date Noted  . AKI (acute kidney injury) (HCC) 03/19/2017  . GERD (gastroesophageal reflux disease) 03/19/2017  . 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 Gout 03/19/2017    . SOB (shortness of breath) 03/19/2017  . Hyperkalemia 03/19/2017  . Leukocytosis 03/19/2017  . Acute renal failure (HCC)   . Moderate to severe mitral regurgitation 10/06/2016  . Hypertensive urgency 03/05/2016  . Chest pain 03/05/2016  . Chronic systolic heart failure (HCC) 03/05/2016  . Hypertension   . High cholesterol   . Typical atrial flutter (HCC)   . Metabolic syndrome   . Atrial flutter (HCC) 03/04/2016    Past Surgical History:  Procedure Laterality Date  . BACK SURGERY    . MANDIBLE FRACTURE SURGERY         Home Medications    Prior to Admission medications   Medication Sig Start Date End Date Taking? Authorizing Provider  allopurinol (ZYLOPRIM) 100 MG tablet Take 100 mg by mouth daily.    Yes [provider]  amiodarone (PACERONE) 200 MG tablet Take 200 mg by mouth daily.   Yes [provider]  apixaban (ELIQUIS) 5 MG TABS tablet Take 5 mg by mouth 2 (two) times daily.   Yes [provider]  carvedilol (COREG) 6.25 MG tablet Take 1 tablet by mouth 2 (two) times daily. 08/16/16  Yes [provider]  colchicine 0.6 MG tablet Take 0.6 mg by mouth daily as needed (gout attack).   Yes [provider]  esomeprazole (NEXIUM) 40 MG capsule Take 1 capsule by mouth daily. 08/14/16  Yes [provider]  glipiZIDE (GLUCOTROL)  5 MG tablet Take 5 mg by mouth daily as needed (CBG >120).  08/12/16  Yes [provider]  sacubitril-valsartan (ENTRESTO) 24-26 MG Take 1 tablet by mouth 2 (two) times daily.   Yes [provider]  spironolactone (ALDACTONE) 25 MG tablet Take 25 mg by mouth 2 (two) times daily.   Yes [provider]    Family History Family History  Problem Relation Age of Onset  . Heart disease Mother   . Hypertension Sister   . Other Father        hypothermia  . Heart failure Son     Social History Social History   Tobacco Use  . Smoking status: Former Smoker    Packs/day: 0.50   . Smokeless tobacco: Never Used  Substance Use Topics  . Alcohol use: Yes    Comment: rarely  . Drug use: No     Allergies   Aspirin   Review of Systems Review of Systems  Constitutional: Negative for chills and fever.  HENT: Negative for congestion and sore throat.   Eyes: Negative for visual disturbance.  Respiratory: Positive for shortness of breath. Negative for cough and wheezing.   Cardiovascular: Negative for chest pain, palpitations and leg swelling.  Gastrointestinal: Negative for abdominal pain, diarrhea, nausea and vomiting.  Genitourinary: Negative for decreased urine volume, difficulty urinating, dysuria and frequency.  Musculoskeletal: Negative for back pain and neck pain.  Skin: Negative for rash.  Neurological: Negative for syncope, light-headedness and headaches.     Physical Exam Updated Vital Signs BP (!) 148/53 (BP Location: Right Arm)   Pulse 64   Temp 98 F (36.7 C) (Oral)   Resp 12   SpO2 100%   Physical Exam  Constitutional: He appears well-developed and well-nourished.  Non-toxic appearance. He does not appear ill. No distress.  Obese male.   HENT:  Head: Normocephalic and atraumatic.  Mouth/Throat: Oropharynx is clear and moist.  Eyes: Conjunctivae are normal. Pupils are equal, round, and reactive to light. Right eye exhibits no discharge. Left eye exhibits no discharge.  Neck: Neck supple. No JVD present.  Cardiovascular: Normal rate, regular rhythm, normal heart sounds and intact distal pulses. Exam reveals no gallop and no friction rub.  No murmur heard. Pulmonary/Chest: Effort normal and breath sounds normal. No accessory muscle usage. No tachypnea. No respiratory distress. He has no wheezes. He has no rales.  Abdominal: Soft. He exhibits no distension. There is no tenderness. There is no guarding.  Musculoskeletal: He exhibits no edema.       Right lower leg: He exhibits no tenderness.       Left lower leg: He exhibits no tenderness.   Trace ankle edema bilaterally. No calf edema or TTP.   Lymphadenopathy:    He has no cervical adenopathy.  Neurological: He is alert. Coordination normal.  Skin: Skin is warm and dry. Capillary refill takes less than 2 seconds. No rash noted. He is not diaphoretic. No erythema. No pallor.  Psychiatric: He has a normal mood and affect. His behavior is normal.  Nursing note and vitals reviewed.    ED Treatments / Results  Labs (all labs ordered are listed, but only abnormal results are displayed) Labs Reviewed  BASIC METABOLIC PANEL - Abnormal; Notable for the following components:      Result Value   Sodium 133 (*)    Potassium 5.4 (*)    CO2 17 (*)    Glucose, Bld 155 (*)    BUN 75 (*)  Creatinine, Ser 2.64 (*)    GFR calc non Af Amer 26 (*)    GFR calc Af Amer 30 (*)    All other components within normal limits  CBC - Abnormal; Notable for the following components:   WBC 14.1 (*)    RBC 4.12 (*)    Hemoglobin 12.2 (*)    HCT 37.9 (*)    All other components within normal limits  CULTURE, BLOOD (ROUTINE X 2)  CULTURE, BLOOD (ROUTINE X 2)  BRAIN NATRIURETIC PEPTIDE  URINALYSIS, ROUTINE W REFLEX MICROSCOPIC  TROPONIN I  TROPONIN I  TROPONIN I  CREATININE, URINE, RANDOM  UREA NITROGEN, URINE  HIV ANTIBODY (ROUTINE TESTING)  BASIC METABOLIC PANEL  CBC  I-STAT TROPONIN, ED    EKG  EKG Interpretation  Date/Time:  Friday March 19 2017 12:00:00 EST Ventricular Rate:  82 PR Interval:  188 QRS Duration: 106 QT Interval:  392 QTC Calculation: 457 R Axis:   41 Text Interpretation:  Non-specific intra-ventricular conduction delay Sinus rhythm with Premature supraventricular complexes ST elevation consider inferior injury or acute infarct Abnormal ECG Confirmed by Vanetta Mulders [phone]) on 03/19/2017 8:52:45 PM       Radiology Dg Chest 2 View  Result Date: 03/19/2017 CLINICAL DATA:  Shortness of breath with exertion over the last week. EXAM: CHEST  2 VIEW  COMPARISON:  09/27/2016 FINDINGS: Heart size mildly enlarged. Mediastinal shadows are normal. The lungs are clear. Chronic density relating to the right first rib end. The vascularity is normal. No effusions. Ordinary degenerative changes affect the spine. IMPRESSION: No active disease.  Mild cardiac enlargement. Electronically Signed   By: Paulina Fusi M.D.   On: 03/19/2017 12:42    Procedures Procedures (including critical care time)  Medications Ordered in ED Medications  sodium polystyrene (KAYEXALATE) 15 GM/60ML suspension 30 g (not administered)  allopurinol (ZYLOPRIM) tablet 100 mg (not administered)  apixaban (ELIQUIS) tablet 5 mg (not administered)  carvedilol (COREG) tablet 6.25 mg (not administered)  pantoprazole (PROTONIX) EC tablet 40 mg (not administered)  albuterol (PROVENTIL) (2.5 MG/3ML) 0.083% nebulizer solution 2.5 mg (not administered)  dextromethorphan-guaiFENesin (MUCINEX DM) 30-600 MG per 12 hr tablet 1 tablet (not administered)  insulin aspart (novoLOG) injection 0-9 Units (not administered)  acetaminophen (TYLENOL) tablet 650 mg (not administered)    Or  acetaminophen (TYLENOL) suppository 650 mg (not administered)  ondansetron (ZOFRAN) tablet 4 mg (not administered)    Or  ondansetron (ZOFRAN) injection 4 mg (not administered)  zolpidem (AMBIEN) tablet 5 mg (not administered)  hydrALAZINE (APRESOLINE) injection 5 mg (not administered)  0.9 %  sodium chloride infusion (not administered)     Initial Impression / Assessment and Plan / ED Course  I have reviewed the triage vital signs and the nursing notes.  Pertinent labs & imaging results that were available during my care of the patient were reviewed by me and considered in my medical decision making (see chart for details).    This  is a 57 y.o. Male who presents to the ED complaining of shortness of breath with exertion.  Patient reports his symptoms began around 5 days ago and have gradually worsened.  He  reports even with ambulating short distances he feels more short of breath.  He has a history of heart failure and is worried this might be contributing.  He reports he takes torsemide and spironolactone on a daily basis.  He also reports taking 2 shots of medications yesterday for gout at his PCPs office.  He  is followed by Dr. Allyson Sabal with cardiology for his heart failure. He denies any changes to his appetite or drinking.  No recent changes to his medications.  He has been eating and drinking normally.  Normal amount of urine output.  He denies any chest pain.  He denies any leg pain or swelling.  On exam the patient is afebrile nontoxic-appearing.  He has no tachypnea, tachycardia or hypoxia on exam.  No increased work of breathing.  Lungs are clear to auscultation bilaterally.  Abdomen is soft and nontender.  He has trace ankle edema bilaterally.  Mucous membranes appear moist. Initial troponin is not elevated.  CBC is remarkable for a white count of 14,000.  He has had no fevers.  BMP is remarkable for acute renal failure with creatinine of 2.64 with a baseline of 1.0.  BUN is 75.  Bicarb is 17.  Potassium is 5.4.  Patient denies any vomiting, diarrhea or decreased oral intake.  He denies any changes to his torsemide or spironolactone.  He does report taking this unknown medication at his PCP office for gout yesterday. Chest x-ray shows a cardiomegaly and no active disease.  A-fib on the monitor. Patient on Eliquis.  Clinically he does not sound fluid overloaded on lung exam. Unsure to the cause of his DOE, but he does have acute renal failure.  He will need admission.  Question if he is been over diuresed. He agrees with plan for admission.  I consulted with Dr. Clyde Lundborg who accepted the patient for admission.  This patient was discussed with Dr. Deretha Emory who agrees with assessment and plan.   Final Clinical Impressions(s) / ED Diagnoses   Final diagnoses:  Acute renal failure, unspecified acute renal  failure type (HCC)  SOB (shortness of breath)    ED Discharge Orders    None       Everlene Farrier, PA-C 03/19/17 2200    Vanetta Mulders, MD 03/19/17 2357

## 2017-03-19 NOTE — H&P (Signed)
History and Physical    Isaac Valdez ZOX:[id number] DOB: 1960/12/02 DOA: 03/19/2017  Referring MD/NP/PA:   PCP: Karle Plumber, MD   Patient coming from:  The patient is coming from home.  At baseline, pt is independent for most of ADL.    Chief Complaint: SOB  HPI: Isaac Valdez is a 57 y.o. male with medical history significant of hypertension, hyperlipidemia, diabetes mellitus, gout, sCHF, A flutter on Eliquis, back pain, morbid obesity, who presents with SOB.  Patient states that he has been having shortness of breath for about 1 week, which has worsened today, particularly on exertion. Patient denies chest pain, fever, chills, no cough. Patient states that he is taking torsemide 20 mg once a day and spironolactone for congestive heart failure. His body weight has increased by approximately 30 pounds recently, which he attributes to eating more food in the holiday season. Patient is mild nausea, but no vomiting, diarrhea or abdominal pain. Denies symptoms of UTI or unilateral weakness.  ED Course: pt was found to have WBC 13.2, negative troponin, acute renal injury with creatinine 2.65, potassium 5.4, pending BNP, temperature normal, no tachycardia, no tachypnea, oxygen saturation 100% on room air. Chest x-rays negative. Patient is placed on telemetry bed for observation.  Review of Systems:   General: no fevers, chills, has body weight gain, has fatigue HEENT: no blurry vision, hearing changes or sore throat Respiratory: has dyspnea, no coughing, wheezing CV: no chest pain, no palpitations GI: has nausea, no vomiting, abdominal pain, diarrhea, constipation GU: no dysuria, burning on urination, increased urinary frequency, hematuria  Ext: no leg edema Neuro: no unilateral weakness, numbness, or tingling, no vision change or hearing loss Skin: no rash, no skin tear. MSK: No muscle spasm, no deformity, no limitation of range of movement in spin Heme: No easy bruising.    Travel history: No recent long distant travel.  Allergy:  Allergies  Allergen Reactions  . Aspirin Anaphylaxis and Other (See Comments)    "indigestion"    Past Medical History:  Diagnosis Date  . Back pain   . CHF (congestive heart failure) (HCC)   . Diabetes mellitus   . Gout   . High cholesterol   . Hypertension     Past Surgical History:  Procedure Laterality Date  . BACK SURGERY    . MANDIBLE FRACTURE SURGERY      Social History:  reports that he has quit smoking. He smoked 0.50 packs per day. he has never used smokeless tobacco. He reports that he drinks alcohol. He reports that he does not use drugs.  Family History:  Family History  Problem Relation Age of Onset  . Heart disease Mother   . Hypertension Sister   . Other Father        hypothermia  . Heart failure Son      Prior to Admission medications   Medication Sig Start Date End Date Taking? Authorizing Provider  allopurinol (ZYLOPRIM) 100 MG tablet Take 100 mg by mouth daily as needed (for gout).     [provider]  apixaban (ELIQUIS) 5 MG TABS tablet Take 5 mg by mouth 2 (two) times daily.    [provider]  carvedilol (COREG) 6.25 MG tablet Take 1 tablet by mouth 2 (two) times daily. 08/16/16   [provider]  esomeprazole (NEXIUM) 40 MG capsule Take 1 capsule by mouth daily. 08/14/16   [provider]  glipiZIDE (GLUCOTROL) 5 MG tablet Take 5 mg by mouth daily. 08/12/16  [provider]  sacubitril-valsartan (ENTRESTO) 24-26 MG Take 1 tablet by mouth 2 (two) times daily.    [provider]  spironolactone (ALDACTONE) 25 MG tablet Take 25 mg by mouth 2 (two) times daily.    [provider]    Physical Exam: Vitals:   03/19/17 1212 03/19/17 1450  BP: 127/61 (!) 148/53  Pulse: 78 64  Resp: 18 12  Temp: 98 F (36.7 C)   TempSrc: Oral   SpO2: 100% 100%   General: Not in acute distress HEENT:       Eyes: PERRL, EOMI, no scleral  icterus.       ENT: No discharge from the ears and nose, no pharynx injection, no tonsillar enlargement.        Neck: Difficult to assess JVD due to morbid obesity, no bruit, no mass felt. Heme: No neck lymph node enlargement. Cardiac: S1/S2, RRR, No murmurs, No gallops or rubs. Respiratory: No rales, wheezing, rhonchi or rubs. GI: Soft, nondistended, nontender, no rebound pain, no organomegaly, BS present. GU: No hematuria Ext: No pitting leg edema bilaterally. 2+DP/PT pulse bilaterally. Musculoskeletal: No joint deformities, No joint redness or warmth, no limitation of ROM in spin. Skin: No rashes.  Neuro: Alert, oriented X3, cranial nerves II-XII grossly intact, moves all extremities normally. Psych: Patient is not psychotic, no suicidal or hemocidal ideation.  Labs on Admission: I have personally reviewed following labs and imaging studies  CBC: Recent Labs  Lab 03/19/17 1213  WBC 14.1*  HGB 12.2*  HCT 37.9*  MCV 92.0  PLT 293   Basic Metabolic Panel: Recent Labs  Lab 03/19/17 1213  NA 133*  K 5.4*  CL 103  CO2 17*  GLUCOSE 155*  BUN 75*  CREATININE 2.64*  CALCIUM 9.6   GFR: Estimated Creatinine Clearance: 47.2 mL/min (A) (by C-G formula based on SCr of 2.64 mg/dL (H)). Liver Function Tests: No results for input(s): AST, ALT, ALKPHOS, BILITOT, PROT, ALBUMIN in the last 168 hours. No results for input(s): LIPASE, AMYLASE in the last 168 hours. No results for input(s): AMMONIA in the last 168 hours. Coagulation Profile: No results for input(s): INR, PROTIME in the last 168 hours. Cardiac Enzymes: No results for input(s): CKTOTAL, CKMB, CKMBINDEX, TROPONINI in the last 168 hours. BNP (last 3 results) No results for input(s): PROBNP in the last 8760 hours. HbA1C: No results for input(s): HGBA1C in the last 72 hours. CBG: No results for input(s): GLUCAP in the last 168 hours. Lipid Profile: No results for input(s): CHOL, HDL, LDLCALC, TRIG, CHOLHDL, LDLDIRECT in  the last 72 hours. Thyroid Function Tests: No results for input(s): TSH, T4TOTAL, FREET4, T3FREE, THYROIDAB in the last 72 hours. Anemia Panel: No results for input(s): VITAMINB12, FOLATE, FERRITIN, TIBC, IRON, RETICCTPCT in the last 72 hours. Urine analysis: No results found for: COLORURINE, APPEARANCEUR, LABSPEC, PHURINE, GLUCOSEU, HGBUR, BILIRUBINUR, KETONESUR, PROTEINUR, UROBILINOGEN, NITRITE, LEUKOCYTESUR Sepsis Labs: @LABRCNTIP (procalcitonin:4,lacticidven:4) )No results found for this or any previous visit (from the past 240 hour(s)).   Radiological Exams on Admission: Dg Chest 2 View  Result Date: 03/19/2017 CLINICAL DATA:  Shortness of breath with exertion over the last week. EXAM: CHEST  2 VIEW COMPARISON:  09/27/2016 FINDINGS: Heart size mildly enlarged. Mediastinal shadows are normal. The lungs are clear. Chronic density relating to the right first rib end. The vascularity is normal. No effusions. Ordinary degenerative changes affect the spine. IMPRESSION: No active disease.  Mild cardiac enlargement. Electronically Signed   By: Scherrie Bateman.D.  On: 03/19/2017 12:42     EKG: Independently reviewed.  Atrial flutter, QTC 457, nonspecific T-wave change.  Assessment/Plan Principal Problem:   AKI (acute kidney injury) (HCC) Active Problems:   Atrial flutter (HCC)   Hypertension   Chronic systolic heart failure (HCC)   GERD (gastroesophageal reflux disease)   Type II diabetes mellitus with renal manifestations (HCC)   Gout   SOB (shortness of breath)   Hyperkalemia   Leukocytosis   AKI (acute kidney injury) (HCC): Creatinine 2.65, BUN 75. Likely due to prerenal secondary to dehydration from overdiuresis and continuation of ACEI/ARB, diuretics. -will place on tele bed for obs - Check FeUrea - Follow up renal function by BMP - Hold diuretics and Entresto -IVF: 50 cc/h of NS  SOB (shortness of breath): Etiology is not clear. He does not have oxygen desaturation. Patient  does not have leg edema. No pulmonary edema on chest x-ray, does not seem to have CHF exacerbation. Patient does not have chest pain. He is on Eliquis less likely to have PE. He has leukocytosis, but no fever. No infiltration on chest x-ray, less likely to have pneumonia. -will cycle trop to r/o ACS -prn albuterol nebs  Chronic systolic heart failure: 2-D echo on 02/20/79 showed EF of 45-50%. Patient does not have leg edema. No pulmonary edema on chest x-ray, no CHF exacerbation. -Hold diuretics (torsemide and spironolactone) due to acute renal injury -hold Entresto due to AKI -check BNP  Atrial flutter (HCC): CHA2DS2-VASc Score is 3, needs oral anticoagulation. Patient is on Eliquis at home. INR is  on admission. Heart rate is well controlled. -continue Eliquis and coreg  HTN:  -Continue home medications: coreg -hold diuretic due to AKI -IV hydralazine prn  GERD: -Protonix  Type II diabetes mellitus with renal manifestations (HCC): Last A1c 6.1, well controled. Patient is taking glipizide at home -SSI  Gout: -continue home allopurinol  Hyperkalemia: K=5.4, no T wave peaking. -Kayexalate 30 g 1 -Hold spironolactone  Leukocytosis: no signs of infection. Likely due to stress induced to demargination -will follow up blood and UA -follow up by CBC  DVT ppx: on Eliquis Code Status: Full code Family Communication: Yes, patient's wife at bed side Disposition Plan:  Anticipate discharge back to previous home environment Consults called:  none Admission status: Obs / tele         Date of Service 03/19/2017    Lorretta Harp [facility] Pager [phone]  If 7PM-7AM, please contact night-coverage [email or web address] Password [facility] 03/19/2017, 9:34 PM

## 2017-03-19 NOTE — ED Triage Notes (Signed)
Pt reports SOB with exertion x 1 week, denies swelling in feet, reports hx of fluid overload.  Pt reports taking diuretics with no improvement.  Pt denies CP at this time, resp e/u.

## 2017-03-19 NOTE — ED Notes (Signed)
Pt given food and a drink 

## 2017-03-20 ENCOUNTER — Other Ambulatory Visit: Payer: Self-pay

## 2017-03-20 ENCOUNTER — Encounter (HOSPITAL_COMMUNITY): Payer: Self-pay | Admitting: *Deleted

## 2017-03-20 DIAGNOSIS — N179 Acute kidney failure, unspecified: Secondary | ICD-10-CM | POA: Diagnosis not present

## 2017-03-20 LAB — GLUCOSE, CAPILLARY
GLUCOSE-CAPILLARY: 143 mg/dL — AB (ref 65–99)
Glucose-Capillary: 86 mg/dL (ref 65–99)

## 2017-03-20 LAB — BASIC METABOLIC PANEL
ANION GAP: 9 (ref 5–15)
BUN: 76 mg/dL — ABNORMAL HIGH (ref 6–20)
CALCIUM: 8.9 mg/dL (ref 8.9–10.3)
CO2: 19 mmol/L — AB (ref 22–32)
Chloride: 106 mmol/L (ref 101–111)
Creatinine, Ser: 2.41 mg/dL — ABNORMAL HIGH (ref 0.61–1.24)
GFR, EST AFRICAN AMERICAN: 33 mL/min — AB (ref >60)
GFR, EST NON AFRICAN AMERICAN: 29 mL/min — AB (ref >60)
Glucose, Bld: 130 mg/dL — ABNORMAL HIGH (ref 65–99)
Potassium: 4.6 mmol/L (ref 3.5–5.1)
SODIUM: 134 mmol/L — AB (ref 135–145)

## 2017-03-20 LAB — CBC
HCT: 36.1 % — ABNORMAL LOW (ref 39.0–52.0)
HEMOGLOBIN: 11.7 g/dL — AB (ref 13.0–17.0)
MCH: 29.6 pg (ref 26.0–34.0)
MCHC: 32.4 g/dL (ref 30.0–36.0)
MCV: 91.4 fL (ref 78.0–100.0)
PLATELETS: 262 10*3/uL (ref 150–400)
RBC: 3.95 MIL/uL — AB (ref 4.22–5.81)
RDW: 14.5 % (ref 11.5–15.5)
WBC: 12.3 10*3/uL — ABNORMAL HIGH (ref 4.0–10.5)

## 2017-03-20 LAB — TROPONIN I
Troponin I: <0.03 ng/mL (ref <0.03)
Troponin I: <0.03 ng/mL (ref <0.03)

## 2017-03-20 NOTE — Discharge Summary (Signed)
Physician Discharge Summary  Isaac Valdez OZH:[id number] DOB: [birth year] DOA: 03/19/2017  PCP: Karle Plumber, MD  Admit date: 03/19/2017 Discharge date: 03/20/2017  Admitted From: Home Disposition:  Home Recommendations for Outpatient Follow-up:  1) Please follow up with Nephrology as an outpatient within a week. 2) Follow up with your PCP in a week. Do BMP test to check your kidney function a week. 3) Donot take Entresto and Aldactone until your kidney function returns to baseline.   Discharge Condition:Stable CODE STATUS:Full Diet recommendation: Heart Healthy  Brief/Interim Summary:  1) Please follow up with Nephrology as an outpatient within a week. 2) Follow up with your PCP in a week. Do BMP test to check your kidney function a week. 3) Donot take Entresto and Aldactone until your kidney function returns to baseline.  Isaac Valdez is a 57 y.o. male with medical history significant of hypertension, hyperlipidemia, diabetes mellitus, gout,sCHF, Aflutter on Eliquis, back pain, morbid obesity, who presents with SOB.  He was admitted for the evaluation of shortness of breath.  Patient was also found to have acute kidney injury on presentation.  He was started on IV fluids.  His chest x-ray did not show any pneumonia.He was saturating 100% on room air. This morning he was found to be comfortable.  His vitals were stable.  Kidney function has slightly improved with IV fluids.  He does not complain of any shortness of breath today.  Patient desperately wanted to be discharged and said he has to go home anyway.  We recommended him to stay 1 more night so that he can get enough hydration that would possibly improve his kidney function.  Patient says that he has an appointment with his new nephrologist on Monday. Patient will be discharged home today.  He has been started recommended to follow-up with his nephrologist as soon as possible.  He should also follow-up with his PCP within  a week.  We recommended to check kidney function within a week by doing a BMP test. At the meantime ,will hold Entresto and Aldactone due to acute kidney injury.  Following problems were addressed during his hospitalization:   AKI (acute kidney injury): Creatinine 2.65, BUN 75 on presentation.  Was started on IV fluids on presentation. creatinine 2.41 this morning.  His baseline creatinine is around 1.6-1.8. We will hold Entresto and spironolactone on discharge until he sees his nephrologist and check kidney function by doing a BMP test.  SOB (shortness of breath):  Resolved.  Saturating normally on room air.  Chest x-ray clear.  Chronic systolic heart failure: Currently compensated. 2-D echo on 02/20/79 showed EF of 45-50%. Patient does not have leg edema. No pulmonary edema on chest x-ray, no CHF exacerbation.  Atrial flutter : CHA2DS2-VASc Score is 3. Patient is on Eliquis at home.Heart rate is well controlled.Continue Eliquis and coreg.  His Eliquis dose might need to be readjusted if his kidney function does not improve.  HTN:  Blood pressure currently stable.Continue Coreg   GERD: Continue Protonix  Type II diabetes mellitus with renal manifestations : Last A1c 6.1, well controled. Patient is taking glipizide at home.  Glipizide might need to be discontinued given his kidney function.  This will be hopefully be arranged as an outpatient during his follow-up with nephrology and PCP.  Gout: Continue home allopurinol  Hyperkalemia:  Resolved  Leukocytosis: No signs of infection.  WBC counts trended down     Discharge Diagnoses:  Principal Problem:   AKI (acute kidney injury) (HCC)  Active Problems:   Atrial flutter (HCC)   Hypertension   Chronic systolic heart failure (HCC)   GERD (gastroesophageal reflux disease)   Type II diabetes mellitus with renal manifestations (HCC)   Gout   SOB (shortness of breath)   Hyperkalemia   Leukocytosis    Discharge  Instructions  Discharge Instructions    Diet - low sodium heart healthy   Complete by:  As directed    Discharge instructions   Complete by:  As directed    1) Please follow up with Nephrology as an outpatient within a week. 2) Follow up with your PCP in a week. Do BMP test to check your kidney function a week. 3) Donot take Entresto and Aldactone until your kidney function returns to baseline.   Increase activity slowly   Complete by:  As directed      Allergies as of 03/20/2017      Reactions   Aspirin Anaphylaxis, Other (See Comments)   "indigestion"      Medication List    STOP taking these medications   ENTRESTO 24-26 MG Generic drug:  sacubitril-valsartan   spironolactone 25 MG tablet Commonly known as:  ALDACTONE     TAKE these medications   allopurinol 100 MG tablet Commonly known as:  ZYLOPRIM Take 100 mg by mouth daily.   amiodarone 200 MG tablet Commonly known as:  PACERONE Take 200 mg by mouth daily.   carvedilol 6.25 MG tablet Commonly known as:  COREG Take 1 tablet by mouth 2 (two) times daily.   colchicine 0.6 MG tablet Take 0.6 mg by mouth daily as needed (gout attack).   ELIQUIS 5 MG Tabs tablet Generic drug:  apixaban Take 5 mg by mouth 2 (two) times daily.   esomeprazole 40 MG capsule Commonly known as:  NEXIUM Take 1 capsule by mouth daily.   glipiZIDE 5 MG tablet Commonly known as:  GLUCOTROL Take 5 mg by mouth daily as needed (CBG >120).      Follow-up Information    Karle Plumber, MD. Schedule an appointment as soon as possible for a visit in 1 week(s).   Specialty:  Internal Medicine Contact information: [phone] [place] [address] [phone]          Allergies  Allergen Reactions  . Aspirin Anaphylaxis and Other (See Comments)    "indigestion"    Consultations: None  Procedures/Studies: Dg Chest 2 View  Result Date: 03/19/2017 CLINICAL DATA:  Shortness of breath with exertion over the last week.  EXAM: CHEST  2 VIEW COMPARISON:  09/27/2016 FINDINGS: Heart size mildly enlarged. Mediastinal shadows are normal. The lungs are clear. Chronic density relating to the right first rib end. The vascularity is normal. No effusions. Ordinary degenerative changes affect the spine. IMPRESSION: No active disease.  Mild cardiac enlargement. Electronically Signed   By: Paulina Fusi M.D.   On: 03/19/2017 12:42   None  Subjective:  Patient seen and examined the bedside this morning.  Remains comfortable.  Denies any shortness of breath.  Wants to be discharged. Discharge Exam: Vitals:   03/20/17 0622 03/20/17 1035  BP: (!) 123/49 (!) 123/53  Pulse: (!) 55 64  Resp: 16 18  Temp: 97.7 F (36.5 C) 98 F (36.7 C)  SpO2: 100% 100%   Vitals:   03/19/17 2336 03/20/17 0317 03/20/17 0622 03/20/17 1035  BP: 122/66  (!) 123/49 (!) 123/53  Pulse: 75  (!) 55 64  Resp: 16  16 18   Temp: Marland Kitchen)  97.4 F (36.3 C)  97.7 F (36.5 C) 98 F (36.7 C)  TempSrc: Oral  Oral Oral  SpO2:   100% 100%  Weight: (!) 316.2 kg (697 lb 1.5 oz)     Height:  6' (1.829 m)      General: Pt is alert, awake, not in acute distress,obese Cardiovascular: RRR, S1/S2 +, no rubs, no gallops Respiratory: CTA bilaterally, no wheezing, no rhonchi Abdominal: Soft, NT, ND, bowel sounds + Extremities: no edema, no cyanosis    The results of significant diagnostics from this hospitalization (including imaging, microbiology, ancillary and laboratory) are listed below for reference.     Microbiology: Recent Results (from the past 240 hour(s))  Culture, blood (Routine X 2) w Reflex to ID Panel     Status: None (Preliminary result)   Collection Time: 03/19/17  9:45 PM  Result Value Ref Range Status   Specimen Description BLOOD RIGHT HAND  Final   Special Requests   Final    BOTTLES DRAWN AEROBIC AND ANAEROBIC Blood Culture adequate volume   Culture NO GROWTH < 12 HOURS  Final   Report Status PENDING  Incomplete  Culture, blood  (Routine X 2) w Reflex to ID Panel     Status: None (Preliminary result)   Collection Time: 03/19/17  9:45 PM  Result Value Ref Range Status   Specimen Description BLOOD LEFT HAND  Final   Special Requests   Final    BOTTLES DRAWN AEROBIC AND ANAEROBIC Blood Culture adequate volume   Culture NO GROWTH < 12 HOURS  Final   Report Status PENDING  Incomplete     Labs: BNP (last 3 results) Recent Labs    03/19/17 2128  BNP 71.7   Basic Metabolic Panel: Recent Labs  Lab 03/19/17 1213 03/20/17 0339  NA 133* 134*  K 5.4* 4.6  CL 103 106  CO2 17* 19*  GLUCOSE 155* 130*  BUN 75* 76*  CREATININE 2.64* 2.41*  CALCIUM 9.6 8.9   Liver Function Tests: No results for input(s): AST, ALT, ALKPHOS, BILITOT, PROT, ALBUMIN in the last 168 hours. No results for input(s): LIPASE, AMYLASE in the last 168 hours. No results for input(s): AMMONIA in the last 168 hours. CBC: Recent Labs  Lab 03/19/17 1213 03/20/17 0339  WBC 14.1* 12.3*  HGB 12.2* 11.7*  HCT 37.9* 36.1*  MCV 92.0 91.4  PLT 293 262   Cardiac Enzymes: Recent Labs  Lab 03/19/17 2128 03/20/17 0339 03/20/17 0852  TROPONINI <0.03 <0.03 <0.03   BNP: Invalid input(s): POCBNP CBG: Recent Labs  Lab 03/19/17 2235 03/19/17 2330 03/20/17 0918 03/20/17 1150  GLUCAP 137* 150* 143* 86   D-Dimer No results for input(s): DDIMER in the last 72 hours. Hgb A1c No results for input(s): HGBA1C in the last 72 hours. Lipid Profile No results for input(s): CHOL, HDL, LDLCALC, TRIG, CHOLHDL, LDLDIRECT in the last 72 hours. Thyroid function studies No results for input(s): TSH, T4TOTAL, T3FREE, THYROIDAB in the last 72 hours.  Invalid input(s): FREET3 Anemia work up No results for input(s): VITAMINB12, FOLATE, FERRITIN, TIBC, IRON, RETICCTPCT in the last 72 hours. Urinalysis    Component Value Date/Time   COLORURINE YELLOW 03/19/2017 2128   APPEARANCEUR CLEAR 03/19/2017 2128   LABSPEC 1.015 03/19/2017 2128   PHURINE 5.0  03/19/2017 2128   GLUCOSEU NEGATIVE 03/19/2017 2128   HGBUR NEGATIVE 03/19/2017 2128   BILIRUBINUR NEGATIVE 03/19/2017 2128   KETONESUR NEGATIVE 03/19/2017 2128   PROTEINUR NEGATIVE 03/19/2017 2128   NITRITE NEGATIVE 03/19/2017  2128   LEUKOCYTESUR NEGATIVE 03/19/2017 2128   Sepsis Labs Invalid input(s): PROCALCITONIN,  WBC,  LACTICIDVEN Microbiology Recent Results (from the past 240 hour(s))  Culture, blood (Routine X 2) w Reflex to ID Panel     Status: None (Preliminary result)   Collection Time: 03/19/17  9:45 PM  Result Value Ref Range Status   Specimen Description BLOOD RIGHT HAND  Final   Special Requests   Final    BOTTLES DRAWN AEROBIC AND ANAEROBIC Blood Culture adequate volume   Culture NO GROWTH < 12 HOURS  Final   Report Status PENDING  Incomplete  Culture, blood (Routine X 2) w Reflex to ID Panel     Status: None (Preliminary result)   Collection Time: 03/19/17  9:45 PM  Result Value Ref Range Status   Specimen Description BLOOD LEFT HAND  Final   Special Requests   Final    BOTTLES DRAWN AEROBIC AND ANAEROBIC Blood Culture adequate volume   Culture NO GROWTH < 12 HOURS  Final   Report Status PENDING  Incomplete     Time coordinating discharge: Over 30 minutes  SIGNED:   Meredith Leeds, MD  [facility] 03/20/2017, 3:21 PM Pager [phone]  If 7PM-7AM, please contact night-coverage [email or web address] Password [email or web address]

## 2017-03-20 NOTE — Progress Notes (Signed)
Patient Discharge: Disposition: Patient discharged to home. Education: Reviewed medications, prescriptions, discharge instructions, and follow-up appointments, verbalized understanding. IV: Discontinued IV before discharge. Telemetry: Discontinued before discharge, CCMD notified. Transportation: Patient escorted out of the unit in w/c. Belongings: Patient took all his belongings with him.  Reinforced the importance of stop taking diuretics as mentioned till he sees the PCP, verbalized understanding.

## 2017-03-20 NOTE — Discharge Instructions (Signed)

## 2017-03-21 LAB — UREA NITROGEN, URINE: UREA NITROGEN UR: 738 mg/dL

## 2017-03-23 LAB — HIV ANTIBODY (ROUTINE TESTING W REFLEX): HIV Screen 4th Generation wRfx: NONREACTIVE

## 2017-03-24 LAB — CULTURE, BLOOD (ROUTINE X 2)
CULTURE: NO GROWTH
CULTURE: NO GROWTH
SPECIAL REQUESTS: ADEQUATE
Special Requests: ADEQUATE

## 2017-04-06 ENCOUNTER — Encounter: Payer: Self-pay | Admitting: Cardiology

## 2017-04-06 ENCOUNTER — Ambulatory Visit (INDEPENDENT_AMBULATORY_CARE_PROVIDER_SITE_OTHER): Payer: Medicare HMO | Admitting: Cardiology

## 2017-04-06 ENCOUNTER — Encounter (INDEPENDENT_AMBULATORY_CARE_PROVIDER_SITE_OTHER): Payer: Self-pay

## 2017-04-06 VITALS — BP 150/68 | HR 92 | Ht 72.0 in | Wt 321.4 lb

## 2017-04-06 DIAGNOSIS — I10 Essential (primary) hypertension: Secondary | ICD-10-CM

## 2017-04-06 DIAGNOSIS — I483 Typical atrial flutter: Secondary | ICD-10-CM | POA: Diagnosis not present

## 2017-04-06 DIAGNOSIS — I428 Other cardiomyopathies: Secondary | ICD-10-CM | POA: Diagnosis not present

## 2017-04-06 NOTE — Progress Notes (Signed)
Electrophysiology Office Note   Date:  04/06/2017   ID:  Ketih Goodie, DOB 1960/12/29, MRN [id number]  PCP:  Karle Plumber, MD  Cardiologist:  Allyson Sabal Primary Electrophysiologist:  Asenath Balash Jorja Loa, MD    Chief Complaint  Patient presents with  . Advice Only    Typical AFlutter  . Shortness of Breath     History of Present Illness: Isaac Valdez is a 57 y.o. male who is being seen today for the evaluation of atrial flutter at the request of Nanetta Batty. Presenting today for electrophysiology evaluation.  He has a history of CHF, diabetes, hypertension, hyperlipidemia.  He has a 30-pack-year history, quit smoking 5 years ago.  He also has typical atrial flutter.  Today, he denies symptoms of palpitations, chest pain, orthopnea, PND, lower extremity edema, claudication, dizziness, presyncope, syncope, bleeding, or neurologic sequela. The patient is tolerating medications without difficulties.  His main symptoms are of shortness of breath.  He gets short of breath with walking short distances.  He does not have PND orthopnea.  He says that he feels that his stomach is quite swollen and gaining fluid.   Past Medical History:  Diagnosis Date  . Back pain   . CHF (congestive heart failure) (HCC)   . Diabetes mellitus   . Dyspnea   . Gout   . High cholesterol   . Hypertension    Past Surgical History:  Procedure Laterality Date  . BACK SURGERY    . MANDIBLE FRACTURE SURGERY       Current Outpatient Medications  Medication Sig Dispense Refill  . allopurinol (ZYLOPRIM) 100 MG tablet Take 100 mg by mouth daily.     Marland Kitchen amiodarone (PACERONE) 200 MG tablet Take 200 mg by mouth daily.    Marland Kitchen apixaban (ELIQUIS) 5 MG TABS tablet Take 5 mg by mouth 2 (two) times daily.    . carvedilol (COREG) 6.25 MG tablet Take 1 tablet by mouth 2 (two) times daily.  3  . colchicine 0.6 MG tablet Take 0.6 mg by mouth daily as needed (gout attack).    . esomeprazole (NEXIUM) 40 MG capsule  Take 1 capsule by mouth daily.  3  . glipiZIDE (GLUCOTROL) 5 MG tablet Take 5 mg by mouth daily as needed (CBG >120).      No current facility-administered medications for this visit.     Allergies:   Aspirin   Social History:  The patient  reports that he has quit smoking. He smoked 0.50 packs per day. he has never used smokeless tobacco. He reports that he drinks alcohol. He reports that he does not use drugs.   Family History:  The patient's family history includes Heart disease in his mother; Heart failure in his son; Hypertension in his sister; Other in his father.    ROS:  Please see the history of present illness.   Otherwise, review of systems is positive for sweats, weight change, shortness of breath, palpitations, snoring, muscle pain, dizziness.   All other systems are reviewed and negative.    PHYSICAL EXAM: VS:  BP (!) 150/68   Pulse 92   Ht 6' (1.829 m)   Wt (!) 321 lb 6.4 oz (145.8 kg)   BMI 43.59 kg/m  , BMI Body mass index is 43.59 kg/m. GEN: Well nourished, well developed, in no acute distress  HEENT: normal  Neck: no JVD, carotid bruits, or masses Cardiac: iRRR; no murmurs, rubs, or gallops,no edema  Respiratory:  clear to auscultation bilaterally, normal  work of breathing GI: soft, nontender, nondistended, + BS MS: no deformity or atrophy  Skin: warm and dry Neuro:  Strength and sensation are intact Psych: euthymic mood, full affect  EKG:  EKG is ordered today. Personal review of the ekg ordered shows atrilal flutter, rate 82  Recent Labs: 03/19/2017: B Natriuretic Peptide 71.7 03/20/2017: BUN 76; Creatinine, Ser 2.41; Hemoglobin 11.7; Platelets 262; Potassium 4.6; Sodium 134    Lipid Panel     Component Value Date/Time   CHOL 215 (H) 03/05/2016 0348   TRIG 241 (H) 03/05/2016 0348   HDL 58 03/05/2016 0348   CHOLHDL 3.7 03/05/2016 0348   VLDL 48 (H) 03/05/2016 0348   LDLCALC 109 (H) 03/05/2016 0348     Wt Readings from Last 3 Encounters:    04/06/17 (!) 321 lb 6.4 oz (145.8 kg)  03/19/17 (!) 697 lb 1.5 oz (316.2 kg)  03/16/17 (!) 318 lb 6.4 oz (144.4 kg)      Other studies Reviewed: Additional studies/ records that were reviewed today include: TTE 02/19/17  Review of the above records today demonstrates:  - Left ventricle: The cavity size was normal. Wall thickness was   increased in a pattern of mild LVH. Systolic function was mildly   reduced. The estimated ejection fraction was in the range of 45%   to 50%. - Aortic valve: Mildly calcified annulus. There was mild   regurgitation.   ASSESSMENT AND PLAN:  1.  Typical atrial flutter: Currently on carvedilol and Eliquis.  Has class II-III heart failure symptoms.  It is likely that getting him back into normal rhythm Taffie Eckmann help with his symptoms.  I did discuss with him the possibilities of cardioversion versus ablation.  Risks and benefits of ablation were discussed and include bleeding, tamponade, heart block, and stroke.  He understands these risks and would like to discuss it with his wife.  We Shadasia Oldfield call him tomorrow for likely scheduling of ablation.  This patients CHA2DS2-VASc Score and unadjusted Ischemic Stroke Rate (% per year) is equal to 2.2 % stroke rate/year from a score of 2  Above score calculated as 1 point each if present [CHF, HTN, DM, Vascular=MI/PAD/Aortic Plaque, Age if 65-74, or Male] Above score calculated as 2 points each if present [Age > 75, or Stroke/TIA/TE]   2.  Hypertension: Blood pressure is elevated today though it has been normal in the past.  No medication adjustments at this time.  3.  Hyperlipidemia: Not on statins, followed by PCP  4.  Chronic systolic heart failure: Ejection fraction 45-50%.  Currently on optimal medical therapy per Dr. Gery Pray.    Current medicines are reviewed at length with the patient today.   The patient does not have concerns regarding his medicines.  The following changes were made today:  none  Labs/  tests ordered today include:  Orders Placed This Encounter  Procedures  . EKG 12-Lead     Disposition:   FU with Rankin Coolman 6 months  Signed, Tamberly Pomplun Jorja Loa, MD  04/06/2017 3:15 PM     [facility] [address] [place] [address] [phone] (office) [phone] (fax)

## 2017-04-06 NOTE — Patient Instructions (Addendum)
Medication Instructions:  Your physician recommends that you continue on your current medications as directed. Please refer to the Current Medication list given to you today.  * If you need a refill on your cardiac medications before your next appointment, please call your pharmacy. *  Labwork: None ordered  Testing/Procedures: Your physician has recommended that you have an ablation. Catheter ablation is a medical procedure used to treat some cardiac arrhythmias (irregular heartbeats). During catheter ablation, a long, thin, flexible tube is put into a blood vessel in your groin (upper thigh), or neck. This tube is called an ablation catheter. It is then guided to your heart through the blood vessel. Radio frequency waves destroy small areas of heart tissue where abnormal heartbeats may cause an arrhythmia to start.   Please call the office if you decide to proceed with ablation   Follow-Up: To be determined  Thank you for choosing [facility]!!   Dory Horn, RN [phone]  Any Other Special Instructions Will Be Listed Below (If Applicable).   Cardiac Ablation Cardiac ablation is a procedure to disable (ablate) a small amount of heart tissue in very specific places. The heart has many electrical connections. Sometimes these connections are abnormal and can cause the heart to beat very fast or irregularly. Ablating some of the problem areas can improve the heart rhythm or return it to normal. Ablation may be done for people who:  Have Wolff-Parkinson-White syndrome.  Have fast heart rhythms (tachycardia).  Have taken medicines for an abnormal heart rhythm (arrhythmia) that were not effective or caused side effects.  Have a high-risk heartbeat that may be life-threatening.  During the procedure, a small incision is made in the neck or the groin, and a long, thin, flexible tube (catheter) is inserted into the incision and moved to the heart. Small devices (electrodes) on  the tip of the catheter will send out electrical currents. A type of X-ray (fluoroscopy) will be used to help guide the catheter and to provide images of the heart. Tell a health care provider about:  Any allergies you have.  All medicines you are taking, including vitamins, herbs, eye drops, creams, and over-the-counter medicines.  Any problems you or family members have had with anesthetic medicines.  Any blood disorders you have.  Any surgeries you have had.  Any medical conditions you have, such as kidney failure.  Whether you are pregnant or may be pregnant. What are the risks? Generally, this is a safe procedure. However, problems may occur, including:  Infection.  Bruising and bleeding at the catheter insertion site.  Bleeding into the chest, especially into the sac that surrounds the heart. This is a serious complication.  Stroke or blood clots.  Damage to other structures or organs.  Allergic reaction to medicines or dyes.  Need for a permanent pacemaker if the normal electrical system is damaged. A pacemaker is a small computer that sends electrical signals to the heart and helps your heart beat normally.  The procedure not being fully effective. This may not be recognized until months later. Repeat ablation procedures are sometimes required.  What happens before the procedure?  Follow instructions from your health care provider about eating or drinking restrictions.  Ask your health care provider about: ? Changing or stopping your regular medicines. This is especially important if you are taking diabetes medicines or blood thinners. ? Taking medicines such as aspirin and ibuprofen. These medicines can thin your blood. Do not take these medicines before your procedure  if your health care provider instructs you not to.  Plan to have someone take you home from the hospital or clinic.  If you will be going home right after the procedure, plan to have someone with  you for 24 hours. What happens during the procedure?  To lower your risk of infection: ? Your health care team will wash or sanitize their hands. ? Your skin will be washed with soap. ? Hair may be removed from the incision area.  An IV tube will be inserted into one of your veins.  You will be given a medicine to help you relax (sedative).  The skin on your neck or groin will be numbed.  An incision will be made in your neck or your groin.  A needle will be inserted through the incision and into a large vein in your neck or groin.  A catheter will be inserted into the needle and moved to your heart.  Dye may be injected through the catheter to help your surgeon see the area of the heart that needs treatment.  Electrical currents will be sent from the catheter to ablate heart tissue in desired areas. There are three types of energy that may be used to ablate heart tissue: ? Heat (radiofrequency energy). ? Laser energy. ? Extreme cold (cryoablation).  When the necessary tissue has been ablated, the catheter will be removed.  Pressure will be held on the catheter insertion area to prevent excessive bleeding.  A bandage (dressing) will be placed over the catheter insertion area. The procedure may vary among health care providers and hospitals. What happens after the procedure?  Your blood pressure, heart rate, breathing rate, and blood oxygen level will be monitored until the medicines you were given have worn off.  Your catheter insertion area will be monitored for bleeding. You will need to lie still for a few hours to ensure that you do not bleed from the catheter insertion area.  Do not drive for 24 hours or as long as directed by your health care provider. Summary  Cardiac ablation is a procedure to disable (ablate) a small amount of heart tissue in very specific places. Ablating some of the problem areas can improve the heart rhythm or return it to normal.  During the  procedure, electrical currents will be sent from the catheter to ablate heart tissue in desired areas. This information is not intended to replace advice given to you by your health care provider. Make sure you discuss any questions you have with your health care provider. Document Released: 07/12/2008 Document Revised: 01/13/2016 Document Reviewed: 01/13/2016 Elsevier Interactive Patient Education  [employer].

## 2017-04-07 ENCOUNTER — Telehealth: Payer: Self-pay | Admitting: *Deleted

## 2017-04-07 DIAGNOSIS — E875 Hyperkalemia: Secondary | ICD-10-CM

## 2017-04-07 NOTE — Telephone Encounter (Addendum)
Pt would like to proceed w/ ablation on 2/13. He understands I will arrange and call him to go over instructions. Also informed pt that he may stop Amiodarone, per Dr. Elberta Fortis, as it is not working. (pt requested stopping d/t nerve issues/pain).  Pt very appreciative of this news.  Advised that medication may take up to 6 weeks to "leave the system".  Pt understands.

## 2017-04-09 ENCOUNTER — Encounter: Payer: Self-pay | Admitting: *Deleted

## 2017-04-14 ENCOUNTER — Other Ambulatory Visit: Payer: Self-pay | Admitting: *Deleted

## 2017-04-14 ENCOUNTER — Ambulatory Visit (HOSPITAL_COMMUNITY)
Admission: RE | Admit: 2017-04-14 | Discharge: 2017-04-14 | Disposition: A | Discharge status: Home / Self Care | Payer: Medicare HMO | Source: Ambulatory Visit | Attending: Internal Medicine | Admitting: Internal Medicine

## 2017-04-14 VITALS — BP 119/56 | HR 87 | Ht 72.0 in | Wt 327.0 lb

## 2017-04-14 DIAGNOSIS — G4730 Sleep apnea, unspecified: Secondary | ICD-10-CM | POA: Diagnosis not present

## 2017-04-14 DIAGNOSIS — E7800 Pure hypercholesterolemia, unspecified: Secondary | ICD-10-CM | POA: Diagnosis not present

## 2017-04-14 DIAGNOSIS — I5022 Chronic systolic (congestive) heart failure: Secondary | ICD-10-CM | POA: Diagnosis not present

## 2017-04-14 DIAGNOSIS — Z886 Allergy status to analgesic agent status: Secondary | ICD-10-CM | POA: Insufficient documentation

## 2017-04-14 DIAGNOSIS — R002 Palpitations: Secondary | ICD-10-CM | POA: Diagnosis not present

## 2017-04-14 DIAGNOSIS — Z79899 Other long term (current) drug therapy: Secondary | ICD-10-CM | POA: Insufficient documentation

## 2017-04-14 DIAGNOSIS — I110 Hypertensive heart disease with heart failure: Secondary | ICD-10-CM | POA: Insufficient documentation

## 2017-04-14 DIAGNOSIS — Z8249 Family history of ischemic heart disease and other diseases of the circulatory system: Secondary | ICD-10-CM | POA: Diagnosis not present

## 2017-04-14 DIAGNOSIS — Z6841 Body Mass Index (BMI) 40.0 and over, adult: Secondary | ICD-10-CM | POA: Insufficient documentation

## 2017-04-14 DIAGNOSIS — R0602 Shortness of breath: Secondary | ICD-10-CM | POA: Diagnosis not present

## 2017-04-14 DIAGNOSIS — I4892 Unspecified atrial flutter: Secondary | ICD-10-CM | POA: Insufficient documentation

## 2017-04-14 DIAGNOSIS — E119 Type 2 diabetes mellitus without complications: Secondary | ICD-10-CM | POA: Insufficient documentation

## 2017-04-14 DIAGNOSIS — Z01812 Encounter for preprocedural laboratory examination: Secondary | ICD-10-CM

## 2017-04-14 DIAGNOSIS — I429 Cardiomyopathy, unspecified: Secondary | ICD-10-CM | POA: Diagnosis not present

## 2017-04-14 DIAGNOSIS — M109 Gout, unspecified: Secondary | ICD-10-CM | POA: Insufficient documentation

## 2017-04-14 DIAGNOSIS — Z7901 Long term (current) use of anticoagulants: Secondary | ICD-10-CM | POA: Insufficient documentation

## 2017-04-14 DIAGNOSIS — E875 Hyperkalemia: Secondary | ICD-10-CM

## 2017-04-14 DIAGNOSIS — I483 Typical atrial flutter: Secondary | ICD-10-CM

## 2017-04-14 DIAGNOSIS — Z87891 Personal history of nicotine dependence: Secondary | ICD-10-CM | POA: Insufficient documentation

## 2017-04-14 DIAGNOSIS — I340 Nonrheumatic mitral (valve) insufficiency: Secondary | ICD-10-CM | POA: Insufficient documentation

## 2017-04-14 DIAGNOSIS — R140 Abdominal distension (gaseous): Secondary | ICD-10-CM | POA: Diagnosis not present

## 2017-04-14 DIAGNOSIS — I428 Other cardiomyopathies: Secondary | ICD-10-CM

## 2017-04-14 LAB — BASIC METABOLIC PANEL
ANION GAP: 8 (ref 5–15)
BUN: 29 mg/dL — AB (ref 6–20)
CO2: 20 mmol/L — AB (ref 22–32)
Calcium: 9.6 mg/dL (ref 8.9–10.3)
Chloride: 109 mmol/L (ref 101–111)
Creatinine, Ser: 1.45 mg/dL — ABNORMAL HIGH (ref 0.61–1.24)
GFR calc Af Amer: >60 mL/min (ref >60)
GFR, EST NON AFRICAN AMERICAN: 53 mL/min — AB (ref >60)
GLUCOSE: 92 mg/dL (ref 65–99)
POTASSIUM: 5.3 mmol/L — AB (ref 3.5–5.1)
Sodium: 137 mmol/L (ref 135–145)

## 2017-04-14 NOTE — Progress Notes (Signed)
[facility] Consult Note   Referring Physician: Dr. Allyson Sabal PCP: Karle Plumber, MD PCP-Cardiologist: Nanetta Batty, MD   HPI:  Isaac Valdez is a 57 yo male initially referred to generally cardiology for severe MR. PMH of NICM with chronic systolic HF. HTN, hyperlipidemia, DM Type 2, gout, morbid obesity, and atrial flutter. 30 pack year smoking history; quit smoking 5 yrs ago. Recently cut back EtOH use. No hx MI or CVA/TIA. CHA2DS2-VASc Score: 3. Per general cardiology, patient recently gained 40 lbs over 6 mo. in late 2018.   In 1/19, Dr. Allyson Sabal referred pt to Dr. Raul Del for management of atrial flutter. Pt has ablation scheduled on 2/13. Per Dr. Raul Del, pt to discontinue amiodarone due to ineffectiveness and "nerve issues/pain."  On 03/19/17, patient admitted for acute renal failure with SOB. No active disease on chest XR. BNP 71. Cr elevated to 2.65. At discharge, Cr was 2.41. Entresto and spironolactone were d/c'd until kidney function returns to patient's baseline (1.6-1.8). Patient has since restarted meds.  Patient presents today for further evaluation by the HF clinic at the request of Dr. Allyson Sabal. Pt is concerned about intermittent SOB with ADLs and occasionally at rest. Also c/o abdominal swelling, 2 pillow orthopnea, and occasional palpitations. Pt denies chest pain or lower extremity swelling. Compliant with BiPAP. Patient concerned about kidney function, so has begun taking torsemide prn. Has also increased spiro 25 BID; unclear if prescribed or self-adjusted. Measures BP at home, typically 130s. Attributes significant weight gain due to diet. Acknowledges that he should cut back on the soda.   ECHO (02/19/17): EF 45-50%, mild LVH, mild AR, could not evaluate for DD due to afib, no MR, mild PR ECHO (10/19/16): EF 30-35%, mild LVH, mild MR, mod LAE, moderately reduced RV function, mod-severe RAE, mild-mod TR, PA peak 35 ECHO (03/05/16): EF 45-50%, mild LVH,  Diffuse HK, Grade I DD, abnormal ventricular septal motion and dyssynergy, mild AR, mild MR, mod LAE, mild RAE, trivial pericardial effusion  Myocardial Perfusion Imaging: (08/27/16): No significant reversible ischemia or fixed scar  Review of Systems: [y] = yes, [ ]  = no    General: Weight gain [y]; Weight loss [ ] ; Anorexia [ ] ; Fatigue [y]; Fever [ ] ; Chills [ ] ; Weakness Cove.Etienne ]   Cardiac: Chest pain/pressure [ ] ; Resting SOB [ y]; Exertional SOB [y]; Orthopnea Cove.Etienne ]; Pedal Edema [ ] ; Palpitations [ ] ; Syncope [ ] ; Presyncope [ ] ; Paroxysmal nocturnal dyspnea[ ]    Pulmonary: Cough [ ] ; Wheezing[ ] ; Hemoptysis[ ] ; Sputum [ ] ; Snoring [ ]    GI: Vomiting[ ] ; Dysphagia[ ] ; Melena[ ] ; Hematochezia [ ] ; Heartburn[ ] ; Abdominal pain [ ] ; Constipation [ ] ; Diarrhea [ ] ; BRBPR [ ]    GU: Hematuria[ ] ; Dysuria [ ] ; Nocturia[ ]   Vascular: Pain in legs with walking [ ] ; Pain in feet with lying flat [ ] ; Non-healing sores [ ] ; Stroke [ ] ; TIA [ ] ; Slurred speech [ ] ;   Neuro: Headaches[ ] ; Vertigo[ ] ; Seizures[ ] ; Paresthesias[ ] ;Blurred vision [ ] ; Diplopia [ ] ; Vision changes [ ]    Ortho/Skin: Arthritis [y]; Joint pain [y]; Muscle pain [ ] ; Joint swelling [ ] ; Back Pain [ ] ; Rash [ ]    Psych: Depression[ ] ; Anxiety[ ]    Heme: Bleeding problems [ ] ; Clotting disorders [ ] ; Anemia [ ]    Endocrine: Diabetes Cove.Etienne ]; Thyroid dysfunction[ ]   Past Medical History:  Diagnosis Date  . Back pain   . CHF (congestive heart failure) (  HCC)   . Diabetes mellitus   . Dyspnea   . Gout   . High cholesterol   . Hypertension     Current Outpatient Medications  Medication Sig Dispense Refill  . acetaminophen (TYLENOL) 500 MG tablet Take 1,000-1,500 mg by mouth every 6 (six) hours as needed (for back or leg pain.).    Marland Kitchen allopurinol (ZYLOPRIM) 300 MG tablet Take 300 mg by mouth daily as needed (for gout flare ups).    Marland Kitchen apixaban (ELIQUIS) 5 MG TABS tablet Take 5 mg by mouth 2 (two) times daily.      . carvedilol (COREG) 6.25 MG tablet Take 6.25 mg by mouth daily.   3  . colchicine 0.6 MG tablet Take 0.6 mg by mouth daily as needed (gout attack).    . esomeprazole (NEXIUM) 40 MG capsule Take 40 mg by mouth daily before breakfast.   3  . glipiZIDE (GLUCOTROL) 5 MG tablet Take 5 mg by mouth daily as needed (CBG >120).     . sacubitril-valsartan (ENTRESTO) 97-103 MG Take 1 tablet by mouth daily.    Marland Kitchen spironolactone (ALDACTONE) 25 MG tablet Take 25 mg by mouth daily.    Marland Kitchen torsemide (DEMADEX) 20 MG tablet Take 20 mg by mouth at bedtime.     No current facility-administered medications for this encounter.     Allergies  Allergen Reactions  . Aspirin Other (See Comments)    "indigestion"      Social History   Socioeconomic History  . Marital status: Married    Spouse name: Not on file  . Number of children: Not on file  . Years of education: Not on file  . Highest education level: Not on file  Social Needs  . Financial resource strain: Not on file  . Food insecurity - worry: Not on file  . Food insecurity - inability: Not on file  . Transportation needs - medical: Not on file  . Transportation needs - non-medical: Not on file  Occupational History  . Not on file  Tobacco Use  . Smoking status: Former Smoker    Packs/day: 0.50  . Smokeless tobacco: Never Used  Substance and Sexual Activity  . Alcohol use: Yes    Comment: rarely  . Drug use: No  . Sexual activity: Yes    Birth control/protection: None  Other Topics Concern  . Not on file  Social History Narrative  . Not on file    Family History  Problem Relation Age of Onset  . Heart disease Mother   . Hypertension Sister   . Other Father        hypothermia  . Heart failure Son    Vitals:   04/14/17 1133  BP: (!) 119/56  Pulse: 87  SpO2: 99%  Weight: (!) 327 lb (148.3 kg)  Height: 6' (1.829 m)   Wt Readings from Last 3 Encounters:  04/14/17 (!) 327 lb (148.3 kg)  04/06/17 (!) 321 lb 6.4 oz (145.8 kg)   03/19/17 (!) 697 lb 1.5 oz (316.2 kg)    PHYSICAL EXAM: General:  Well appearing, obese male. NAD, NAD  HEENT: normal Neck: supple. JVD difficult to visualize due to large body habitus but does not appear elevated. Carotids 2+ bilat; no bruits. No lymphadenopathy or thyromegaly appreciated. Cor: PMI nondisplaced. Slightly irregular rate & rhythm. No rubs, gallops or murmurs. Lungs: clear, but diminished bilaterally Abdomen: obese soft, nontender, nondistended. No hepatosplenomegaly. No bruits or masses. Good bowel sounds. Extremities: no cyanosis, clubbing, rash,  trace BLE edema Neuro: alert & oriented x 3, cranial nerves grossly intact. moves all 4 extremities w/o difficulty. Affect pleasant.   ASSESSMENT & PLAN:  1. Chronic Systolic Heart Failure, due to presumed NICM, EF 45-50% in 12/18 - Suspect CM due to NICM based on Myoview 6/18. No cath due to CKD - Currently stable NYHA II-early III. Suspect majority of symptoms related to his obesity.  - Volume status looks good on exam.  - OK to continue torsemide 20 mg as needed.  - Continue coreg 6.25 mg QD - Continue Entresto 97-103 mg QD - Continue Spiro 25 mg BID. BMET today. May need to cut back to 25 daily.   - Overall about as good as we are going to get him from a HF standpoint. EF only mildly decreased in setting of AFL. Stressed need to be more active and lose weight. He is drinking numerous sodas and eating a lot of sweets. Instructed him to cut back. F/u with Dr. Allyson Sabal.  2. Mitral regurgitation - likely functional - last echo 12/18 only mild MR  3. Atrial flutter - Pt following with Dr. Elberta Fortis. - Has ablation scheduled for 04/20/17. - Continue Eliquis 5mg  BID.   4. HTN  - Well controlled on medications as above.   5. Sleep Apnea - Continue BiPAP use.  6. Morbid Obesity - Body mass index is 44.35 kg/m.   - Educated patient on the importance of dietary modification for weight loss. Encouraged pt that weight loss  could also help relieve some of his SOB. Cut back on sodas and sweets   Arvilla Meres, MD 04/14/17

## 2017-04-14 NOTE — Patient Instructions (Signed)
Labs today We will only contact you if something comes back abnormal or we need to make some changes. Otherwise no news is good news!  We will ONLY make medication changes based on your above labs, we will be in contact. (Cut Spironolactone to 25 mg,daily if potassium is elevated)  Your physician recommends that you schedule a follow-up appointment in: as needed

## 2017-04-15 LAB — CBC WITH DIFFERENTIAL/PLATELET
Basophils Absolute: 0 10*3/uL (ref 0.0–0.2)
Basos: 0 %
EOS (ABSOLUTE): 0.1 10*3/uL (ref 0.0–0.4)
EOS: 2 %
HEMATOCRIT: 35 % — AB (ref 37.5–51.0)
Hemoglobin: 11.4 g/dL — ABNORMAL LOW (ref 13.0–17.7)
Immature Grans (Abs): 0 10*3/uL (ref 0.0–0.1)
Immature Granulocytes: 0 %
Lymphocytes Absolute: 1.3 10*3/uL (ref 0.7–3.1)
Lymphs: 22 %
MCH: 30.4 pg (ref 26.6–33.0)
MCHC: 32.6 g/dL (ref 31.5–35.7)
MCV: 93 fL (ref 79–97)
MONOS ABS: 0.4 10*3/uL (ref 0.1–0.9)
Monocytes: 6 %
Neutrophils Absolute: 4.2 10*3/uL (ref 1.4–7.0)
Neutrophils: 70 %
Platelets: 331 10*3/uL (ref 150–379)
RBC: 3.75 x10E6/uL — AB (ref 4.14–5.80)
RDW: 15.6 % — ABNORMAL HIGH (ref 12.3–15.4)
WBC: 6 10*3/uL (ref 3.4–10.8)

## 2017-04-15 LAB — BASIC METABOLIC PANEL
BUN / CREAT RATIO: 21 — AB (ref 9–20)
BUN: 31 mg/dL — ABNORMAL HIGH (ref 6–24)
CHLORIDE: 108 mmol/L — AB (ref 96–106)
CO2: 18 mmol/L — ABNORMAL LOW (ref 20–29)
CREATININE: 1.48 mg/dL — AB (ref 0.76–1.27)
Calcium: 9.5 mg/dL (ref 8.7–10.2)
GFR calc Af Amer: 61 mL/min/{1.73_m2} (ref >59)
GFR calc non Af Amer: 53 mL/min/{1.73_m2} — ABNORMAL LOW (ref >59)
GLUCOSE: 104 mg/dL — AB (ref 65–99)
Potassium: 5.3 mmol/L — ABNORMAL HIGH (ref 3.5–5.2)
SODIUM: 143 mmol/L (ref 134–144)

## 2017-04-16 NOTE — Telephone Encounter (Signed)
Late entry:  Letter of instructions given to patient on 2/6 when he stopped by the [facility] office for pre procedure lab work.

## 2017-04-16 NOTE — Telephone Encounter (Signed)
Lab results from 2/6 w/ hyperkalemia. Pt scheduled to stop by [facility] office Monday 2/11 for repeat blood work.

## 2017-04-20 ENCOUNTER — Encounter (HOSPITAL_COMMUNITY): Payer: Self-pay | Admitting: Certified Registered Nurse Anesthetist

## 2017-04-20 LAB — BASIC METABOLIC PANEL
BUN/Creatinine Ratio: 19 (ref 9–20)
BUN: 44 mg/dL — ABNORMAL HIGH (ref 6–24)
CO2: 19 mmol/L — ABNORMAL LOW (ref 20–29)
Calcium: 9.7 mg/dL (ref 8.7–10.2)
Chloride: 102 mmol/L (ref 96–106)
Creatinine, Ser: 2.31 mg/dL — ABNORMAL HIGH (ref 0.76–1.27)
GFR, EST AFRICAN AMERICAN: 36 mL/min/{1.73_m2} — AB (ref >59)
GFR, EST NON AFRICAN AMERICAN: 31 mL/min/{1.73_m2} — AB (ref >59)
Glucose: 85 mg/dL (ref 65–99)
POTASSIUM: 5.8 mmol/L — AB (ref 3.5–5.2)
SODIUM: 137 mmol/L (ref 134–144)

## 2017-04-20 NOTE — Anesthesia Preprocedure Evaluation (Addendum)
Anesthesia Evaluation  Patient identified by MRN, date of birth, ID band Patient awake    Reviewed: Allergy & Precautions, NPO status , Patient's Chart, lab work & pertinent test results  Airway Mallampati: II  TM Distance: >3 FB Neck ROM: Full    Dental no notable dental hx. (+)    Pulmonary former smoker,    Pulmonary exam normal breath sounds clear to auscultation       Cardiovascular hypertension, Pt. on medications Normal cardiovascular exam+ dysrhythmias Atrial Fibrillation  Rhythm:Regular Rate:Normal     Neuro/Psych negative neurological ROS  negative psych ROS   GI/Hepatic Neg liver ROS, GERD  Medicated,  Endo/Other  diabetes, Type 2  Renal/GU ARFRenal disease  negative genitourinary   Musculoskeletal negative musculoskeletal ROS (+)   Abdominal   Peds negative pediatric ROS (+)  Hematology negative hematology ROS (+)   Anesthesia Other Findings   Reproductive/Obstetrics negative OB ROS                            Anesthesia Physical Anesthesia Plan  ASA: III  Anesthesia Plan: MAC   Post-op Pain Management:    Induction: Intravenous  PONV Risk Score and Plan: 1 and Treatment may vary due to age or medical condition  Airway Management Planned: Natural Airway, Nasal Cannula and Mask  Additional Equipment:   Intra-op Plan:   Post-operative Plan:   Informed Consent: I have reviewed the patients History and Physical, chart, labs and discussed the procedure including the risks, benefits and alternatives for the proposed anesthesia with the patient or authorized representative who has indicated his/her understanding and acceptance.     Plan Discussed with: CRNA and Anesthesiologist  Anesthesia Plan Comments:       Anesthesia Quick Evaluation

## 2017-04-21 ENCOUNTER — Ambulatory Visit (HOSPITAL_COMMUNITY): Payer: Medicare HMO | Admitting: Certified Registered Nurse Anesthetist

## 2017-04-21 ENCOUNTER — Encounter (HOSPITAL_COMMUNITY): Payer: Self-pay

## 2017-04-21 ENCOUNTER — Encounter (HOSPITAL_COMMUNITY)
Admission: RE | Disposition: A | Discharge status: Home / Self Care | Payer: Self-pay | Source: Ambulatory Visit | Attending: Cardiology

## 2017-04-21 ENCOUNTER — Ambulatory Visit (HOSPITAL_COMMUNITY)
Admission: RE | Admit: 2017-04-21 | Discharge: 2017-04-21 | Disposition: A | Discharge status: Home / Self Care | Payer: Medicare HMO | Source: Ambulatory Visit | Attending: Cardiology | Admitting: Cardiology

## 2017-04-21 DIAGNOSIS — I4891 Unspecified atrial fibrillation: Secondary | ICD-10-CM | POA: Insufficient documentation

## 2017-04-21 DIAGNOSIS — K219 Gastro-esophageal reflux disease without esophagitis: Secondary | ICD-10-CM | POA: Diagnosis not present

## 2017-04-21 DIAGNOSIS — I4892 Unspecified atrial flutter: Secondary | ICD-10-CM | POA: Diagnosis not present

## 2017-04-21 DIAGNOSIS — E119 Type 2 diabetes mellitus without complications: Secondary | ICD-10-CM | POA: Diagnosis not present

## 2017-04-21 DIAGNOSIS — I10 Essential (primary) hypertension: Secondary | ICD-10-CM | POA: Diagnosis not present

## 2017-04-21 DIAGNOSIS — Z87891 Personal history of nicotine dependence: Secondary | ICD-10-CM | POA: Insufficient documentation

## 2017-04-21 HISTORY — PX: A-FLUTTER ABLATION: EP1230

## 2017-04-21 LAB — BASIC METABOLIC PANEL
ANION GAP: 14 (ref 5–15)
BUN: 56 mg/dL — ABNORMAL HIGH (ref 6–20)
CO2: 17 mmol/L — AB (ref 22–32)
CREATININE: 2.32 mg/dL — AB (ref 0.61–1.24)
Calcium: 9.7 mg/dL (ref 8.9–10.3)
Chloride: 105 mmol/L (ref 101–111)
GFR, EST AFRICAN AMERICAN: 35 mL/min — AB (ref >60)
GFR, EST NON AFRICAN AMERICAN: 30 mL/min — AB (ref >60)
Glucose, Bld: 110 mg/dL — ABNORMAL HIGH (ref 65–99)
Potassium: 5.4 mmol/L — ABNORMAL HIGH (ref 3.5–5.1)
SODIUM: 136 mmol/L (ref 135–145)

## 2017-04-21 LAB — GLUCOSE, CAPILLARY
GLUCOSE-CAPILLARY: 104 mg/dL — AB (ref 65–99)
Glucose-Capillary: 111 mg/dL — ABNORMAL HIGH (ref 65–99)

## 2017-04-21 SURGERY — A-FLUTTER ABLATION
Anesthesia: Monitor Anesthesia Care

## 2017-04-21 MED ORDER — HEPARIN (PORCINE) IN NACL 2-0.9 UNIT/ML-% IJ SOLN
INTRAMUSCULAR | Status: AC | PRN
  Administered 2017-04-21: 500 mL

## 2017-04-21 MED ORDER — BUPIVACAINE HCL (PF) 0.25 % IJ SOLN
INTRAMUSCULAR | Status: AC
  Filled 2017-04-21: qty 30

## 2017-04-21 MED ORDER — PROPOFOL 10 MG/ML IV BOLUS
INTRAVENOUS | Status: DC | PRN
  Administered 2017-04-21: 200 mg via INTRAVENOUS

## 2017-04-21 MED ORDER — ONDANSETRON HCL 4 MG/2ML IJ SOLN: 4.0000 mg | Freq: Four times a day (QID) | INTRAMUSCULAR | Status: DC | PRN

## 2017-04-21 MED ORDER — FENTANYL CITRATE (PF) 100 MCG/2ML IJ SOLN
INTRAMUSCULAR | Status: AC
  Filled 2017-04-21: qty 2

## 2017-04-21 MED ORDER — DEXAMETHASONE SODIUM PHOSPHATE 10 MG/ML IJ SOLN
INTRAMUSCULAR | Status: DC | PRN
Start: 2017-04-21 — End: 2017-04-21
  Administered 2017-04-21: 10 mg via INTRAVENOUS

## 2017-04-21 MED ORDER — FENTANYL CITRATE (PF) 100 MCG/2ML IJ SOLN
INTRAMUSCULAR | Status: DC | PRN
  Administered 2017-04-21: 100 ug via INTRAVENOUS

## 2017-04-21 MED ORDER — HEPARIN (PORCINE) IN NACL 2-0.9 UNIT/ML-% IJ SOLN
INTRAMUSCULAR | Status: AC
  Filled 2017-04-21: qty 500

## 2017-04-21 MED ORDER — SUGAMMADEX SODIUM 200 MG/2ML IV SOLN
INTRAVENOUS | Status: DC | PRN
  Administered 2017-04-21: 300 mg via INTRAVENOUS

## 2017-04-21 MED ORDER — ONDANSETRON HCL 4 MG/2ML IJ SOLN
INTRAMUSCULAR | Status: DC | PRN
  Administered 2017-04-21: 4 mg via INTRAVENOUS

## 2017-04-21 MED ORDER — SUCCINYLCHOLINE CHLORIDE 200 MG/10ML IV SOSY
PREFILLED_SYRINGE | INTRAVENOUS | Status: DC | PRN
  Administered 2017-04-21: 160 mg via INTRAVENOUS

## 2017-04-21 MED ORDER — BUPIVACAINE HCL (PF) 0.25 % IJ SOLN
INTRAMUSCULAR | Status: DC | PRN
  Administered 2017-04-21: 20 mL

## 2017-04-21 MED ORDER — ROCURONIUM BROMIDE 100 MG/10ML IV SOLN
INTRAVENOUS | Status: DC | PRN
  Administered 2017-04-21: 30 mg via INTRAVENOUS

## 2017-04-21 MED ORDER — FENTANYL CITRATE (PF) 100 MCG/2ML IJ SOLN
25.0000 ug | Freq: Once | INTRAMUSCULAR | Status: AC
  Administered 2017-04-21: 25 ug via INTRAVENOUS

## 2017-04-21 MED ORDER — MIDAZOLAM HCL 5 MG/5ML IJ SOLN
INTRAMUSCULAR | Status: DC | PRN
  Administered 2017-04-21: 2 mg via INTRAVENOUS

## 2017-04-21 MED ORDER — PHENYLEPHRINE HCL 10 MG/ML IJ SOLN
INTRAVENOUS | Status: DC | PRN
  Administered 2017-04-21: 40 ug/min via INTRAVENOUS

## 2017-04-21 MED ORDER — SODIUM CHLORIDE 0.9 % IV SOLN
INTRAVENOUS | Status: DC
  Administered 2017-04-21: 12:00:00 via INTRAVENOUS

## 2017-04-21 SURGICAL SUPPLY — 13 items
BAG SNAP BAND KOVER 36X36 (MISCELLANEOUS) ×2 IMPLANT
BLANKET WARM UNDERBOD FULL ACC (MISCELLANEOUS) ×2 IMPLANT
CATH EZ STEER NAV 8MM F-J CUR (ABLATOR) ×2 IMPLANT
CATH JOSEPHSON QUAD-ALLRED 6FR (CATHETERS) ×4 IMPLANT
CATH WEB BIDIR CS D-F NONAUTO (CATHETERS) ×2 IMPLANT
HOVERMATT SINGLE USE (MISCELLANEOUS) ×2 IMPLANT
PACK EP LATEX FREE (CUSTOM PROCEDURE TRAY) ×1
PACK EP LF (CUSTOM PROCEDURE TRAY) ×1 IMPLANT
PAD DEFIB LIFELINK (PAD) ×2 IMPLANT
PATCH CARTO3 (PAD) ×2 IMPLANT
SHEATH PINNACLE 6F 10CM (SHEATH) ×2 IMPLANT
SHEATH PINNACLE 7F 10CM (SHEATH) ×2 IMPLANT
SHEATH PINNACLE 8F 10CM (SHEATH) ×2 IMPLANT

## 2017-04-21 NOTE — Discharge Instructions (Addendum)
Hold Entresto until labs are checked next week. Increase coreg to 12.5 mg twice daily while holding Entresto. Check blood pressure daily since medications have been changed.  No driving for 4 days. No lifting over 5 lbs for 1 week. No sexual activity for 1 week. You may return to work in 1 week. Keep procedure site clean & dry. If you notice increased pain, swelling, bleeding or pus, call/return!  You may shower, but no soaking baths/hot tubs/pools for 1 week.   Ablation , Care After This sheet gives you information about how to care for yourself after your procedure. Your health care provider may also give you more specific instructions. If you have problems or questions, contact your health care provider. What can I expect after the procedure? After the procedure, it is common to have bruising and tenderness at the catheter insertion area. Follow these instructions at home: Insertion site care  Follow instructions from your health care provider about how to take care of your insertion site. Make sure you: ? Wash your hands with soap and water before you change your bandage (dressing). If soap and water are not available, use hand sanitizer. ? Change your dressing as told by your health care provider. ? Leave stitches (sutures), skin glue, or adhesive strips in place. These skin closures may need to stay in place for 2 weeks or longer. If adhesive strip edges start to loosen and curl up, you may trim the loose edges. Do not remove adhesive strips completely unless your health care provider tells you to do that.  Do not take baths, swim, or use a hot tub until your health care provider approves.  You may shower 24-48 hours after the procedure or as told by your health care provider. ? Gently wash the site with plain soap and water. ? Pat the area dry with a clean towel. ? Do not rub the site. This may cause bleeding.  Do not apply powder or lotion to the site. Keep the site clean and  dry.  Check your insertion site every day for signs of infection. Check for: ? Redness, swelling, or pain. ? Fluid or blood. ? Warmth. ? Pus or a bad smell. Activity  Rest as told by your health care provider, usually for 1-2 days.  Do not lift anything that is heavier than 10 lbs. (4.5 kg) or as told by your health care provider.  Do not drive for 24 hours if you were given a medicine to help you relax (sedative).  Do not drive or use heavy machinery while taking prescription pain medicine. General instructions  Return to your normal activities as told by your health care provider, usually in about a week. Ask your health care provider what activities are safe for you.  If the catheter site starts bleeding, lie flat and put pressure on the site. If the bleeding does not stop, get help right away. This is a medical emergency.  Drink enough fluid to keep your urine clear or pale yellow. This helps flush the contrast dye from your body.  Take over-the-counter and prescription medicines only as told by your health care provider.  Keep all follow-up visits as told by your health care provider. This is important. Contact a health care provider if:  You have a fever or chills.  You have redness, swelling, or pain around your insertion site.  You have fluid or blood coming from your insertion site.  The insertion site feels warm to the touch.  You have pus or a bad smell coming from your insertion site.  You have bruising around the insertion site.  You notice blood collecting in the tissue around the catheter site (hematoma). The hematoma may be painful to the touch. Get help right away if:  You have severe pain at the catheter insertion area.  The catheter insertion area swells very fast.  The catheter insertion area is bleeding, and the bleeding does not stop when you hold steady pressure on the area.  The area near or just beyond the catheter insertion site becomes  pale, cool, tingly, or numb. These symptoms may represent a serious problem that is an emergency. Do not wait to see if the symptoms will go away. Get medical help right away. Call your local emergency services (911 in the [place] Do not drive yourself to the hospital. Summary  After the procedure, it is common to have bruising and tenderness at the catheter insertion area.  After the procedure, it is important to rest and drink plenty of fluids.  Do not take baths, swim, or use a hot tub until your health care provider says it is okay to do so. You may shower 24-48 hours after the procedure or as told by your health care provider.  If the catheter site starts bleeding, lie flat and put pressure on the site. If the bleeding does not stop, get help right away. This is a medical emergency. This information is not intended to replace advice given to you by your health care provider. Make sure you discuss any questions you have with your health care provider. Document Released: 09/11/2004 Document Revised: 01/29/2016 Document Reviewed: 01/29/2016 Elsevier Interactive Patient Education  [employer].

## 2017-04-21 NOTE — Progress Notes (Deleted)
Per Dr Elberta Fortis, pt may leave now.

## 2017-04-21 NOTE — Anesthesia Procedure Notes (Signed)
Procedure Name: Intubation Date/Time: 04/21/2017 1:25 PM Performed by: Candis Shine, CRNA Pre-anesthesia Checklist: Patient identified, Emergency Drugs available, Suction available and Patient being monitored Patient Re-evaluated:Patient Re-evaluated prior to induction Oxygen Delivery Method: Circle System Utilized Preoxygenation: Pre-oxygenation with 100% oxygen Induction Type: IV induction Ventilation: Mask ventilation without difficulty Laryngoscope Size: Mac and 4 Grade View: Grade I Tube type: Oral Tube size: 7.5 mm Number of attempts: 1 Airway Equipment and Method: Stylet Placement Confirmation: ETT inserted through vocal cords under direct vision,  positive ETCO2 and breath sounds checked- equal and bilateral Secured at: 23 cm Tube secured with: Tape Dental Injury: Teeth and Oropharynx as per pre-operative assessment

## 2017-04-21 NOTE — H&P (Signed)
Isaac Valdez has presented today for surgery, with the diagnosis of atrial flutter.  The various methods of treatment have been discussed with the patient and family. After consideration of risks, benefits and other options for treatment, the patient has consented to  Procedure(s): Catheter ablation as a surgical intervention .  Risks include but not limited to bleeding, tamponade, heart block, stroke, damage to surrounding organs, among others. The patient's history has been reviewed, patient examined, no change in status, stable for surgery.  I have reviewed the patient's chart and labs.  Questions were answered to the patient's satisfaction.    Cieara Stierwalt Elberta Fortis, MD 04/21/2017 12:41 PM

## 2017-04-21 NOTE — Progress Notes (Signed)
Isaac Valdez seen and examined post atrial flutter ablation. Patient remains in sinus rhythm. Discussed activity restrictions. Told to hold amiodarone at discharge. Plan to check BMP on Monday 2/18 to reassess kidney function and the possible need for medication adjustments.  In the interim, will hold Entresto and take 12.5 mg coreg BID. Plan for discharge today after bedrest over.  Loman Brooklyn, MD

## 2017-04-21 NOTE — Transfer of Care (Signed)
Immediate Anesthesia Transfer of Care Note  Patient: Isaac Valdez  Procedure(s) Performed: A-FLUTTER ABLATION (N/A )  Patient Location: Cath Lab  Anesthesia Type:General  Level of Consciousness: awake, alert  and oriented  Airway & Oxygen Therapy: Patient Spontanous Breathing and Patient connected to nasal cannula oxygen  Post-op Assessment: Report given to RN and Post -op Vital signs reviewed and stable  Post vital signs: Reviewed and stable  Last Vitals:  Vitals:   04/21/17 1056  BP: (!) 129/59  Pulse: 72  Resp: 18  Temp: 36.4 C  SpO2: 100%    Last Pain:  Vitals:   04/21/17 1056  TempSrc: Oral         Complications: No apparent anesthesia complications

## 2017-04-21 NOTE — Progress Notes (Signed)
Report given pt accepted. 

## 2017-04-21 NOTE — Progress Notes (Addendum)
Site area: Right groin a 6,7,  And 8 french venous sheath was removed  Site Prior to Removal:  Level 0  Pressure Applied For 20 MINUTES    Bedrest Beginning at 1530p  Manual:   Yes.    Patient Status During Pull:  stable  Post Pull Groin Site:  Level 0  Post Pull Instructions Given:  Yes.    Post Pull Pulses Present:  Yes.    Dressing Applied:  Yes.    Comments:  VS remain stable  Dr Elberta Fortis in to talk to pt.  Pain med given as ordered  And effective for back pain 10 to 7.

## 2017-04-22 ENCOUNTER — Telehealth: Payer: Self-pay | Admitting: *Deleted

## 2017-04-22 ENCOUNTER — Encounter (HOSPITAL_COMMUNITY): Payer: Self-pay | Admitting: Cardiology

## 2017-04-22 DIAGNOSIS — E875 Hyperkalemia: Secondary | ICD-10-CM

## 2017-04-22 NOTE — Telephone Encounter (Signed)
-----   Message from Will Jorja Loa, MD sent at 04/21/2017  4:09 PM EST ----- FYI, holding meds and recheck BMP on Monday. He did fine with ablation.

## 2017-04-22 NOTE — Telephone Encounter (Signed)
Pt expresses that he feels better already! He understands to stop by [facility] Monday for lab. Will place order in Epic and inform [facility] office staff.

## 2017-04-23 ENCOUNTER — Telehealth: Payer: Self-pay | Admitting: Cardiology

## 2017-04-23 NOTE — Anesthesia Postprocedure Evaluation (Signed)
Anesthesia Post Note  Patient: Isaac Valdez  Procedure(s) Performed: A-FLUTTER ABLATION (N/A )     Patient location during evaluation: Cath Lab Anesthesia Type: General Level of consciousness: awake and sedated Pain management: pain level controlled Vital Signs Assessment: post-procedure vital signs reviewed and stable Respiratory status: spontaneous breathing, nonlabored ventilation, respiratory function stable and patient connected to nasal cannula oxygen Cardiovascular status: blood pressure returned to baseline and stable Postop Assessment: no apparent nausea or vomiting Anesthetic complications: no    Last Vitals:  Vitals:   04/21/17 1800 04/21/17 1900  BP: (!) 133/45 (!) 142/53  Pulse: 83 85  Resp: 19 20  Temp:    SpO2: 100% 97%    Last Pain:  Vitals:   04/21/17 1605  TempSrc: Oral  PainSc: 6                  Kristoff Coonradt,JAMES TERRILL

## 2017-04-23 NOTE — Telephone Encounter (Signed)
Pt concerned b/c his fit bit sometimes is showing his HR 120s. He would like to know if they are accurate.  Reports his BP cuff showing normal HR and regular rhythm. States before ablation it always showed him irregular. He isn't sure if fit bit is working properly.  Informed pt that I cannot specify if accurate and to continue to monitor. Advised to take radial pulse for a full minute to check HR, if needed. He is agreeable. Wants [place] to know he is still feeling much better since ablation.

## 2017-04-23 NOTE — Telephone Encounter (Signed)
New message ° °Pt verbalized that he is calling for the RN °

## 2017-04-26 ENCOUNTER — Other Ambulatory Visit: Payer: Self-pay

## 2017-04-26 DIAGNOSIS — E875 Hyperkalemia: Secondary | ICD-10-CM

## 2017-04-27 LAB — BASIC METABOLIC PANEL
BUN / CREAT RATIO: 21 — AB (ref 9–20)
BUN: 38 mg/dL — ABNORMAL HIGH (ref 6–24)
CO2: 20 mmol/L (ref 20–29)
Calcium: 9.3 mg/dL (ref 8.7–10.2)
Chloride: 103 mmol/L (ref 96–106)
Creatinine, Ser: 1.83 mg/dL — ABNORMAL HIGH (ref 0.76–1.27)
GFR calc Af Amer: 47 mL/min/{1.73_m2} — ABNORMAL LOW (ref >59)
GFR calc non Af Amer: 41 mL/min/{1.73_m2} — ABNORMAL LOW (ref >59)
GLUCOSE: 95 mg/dL (ref 65–99)
POTASSIUM: 4.8 mmol/L (ref 3.5–5.2)
SODIUM: 139 mmol/L (ref 134–144)

## 2017-04-29 ENCOUNTER — Telehealth: Payer: Self-pay | Admitting: *Deleted

## 2017-04-29 DIAGNOSIS — R7989 Other specified abnormal findings of blood chemistry: Secondary | ICD-10-CM

## 2017-04-29 NOTE — Telephone Encounter (Signed)
-----   Message from Will Jorja Loa, MD sent at 04/27/2017  9:04 AM EST ----- Creatinine improving. Continue to hold entresto. Recheck next Monday for possible restart. Need to report home BP as is off high dose entresto currently.

## 2017-05-13 ENCOUNTER — Other Ambulatory Visit: Payer: Self-pay | Admitting: *Deleted

## 2017-05-13 DIAGNOSIS — R7989 Other specified abnormal findings of blood chemistry: Secondary | ICD-10-CM

## 2017-05-13 NOTE — Progress Notes (Signed)
bmet  

## 2017-05-26 ENCOUNTER — Ambulatory Visit: Payer: Medicare HMO | Admitting: Cardiology

## 2017-06-04 ENCOUNTER — Ambulatory Visit: Payer: Medicare HMO | Admitting: Cardiology

## 2017-06-21 ENCOUNTER — Encounter: Payer: Self-pay | Admitting: Cardiology

## 2017-06-21 ENCOUNTER — Telehealth: Payer: Self-pay | Admitting: Cardiology

## 2017-06-21 ENCOUNTER — Ambulatory Visit: Payer: Medicare HMO | Admitting: Cardiology

## 2017-06-21 VITALS — BP 142/86 | HR 56 | Ht 72.0 in | Wt 320.0 lb

## 2017-06-21 DIAGNOSIS — R9431 Abnormal electrocardiogram [ECG] [EKG]: Secondary | ICD-10-CM | POA: Diagnosis not present

## 2017-06-21 DIAGNOSIS — I483 Typical atrial flutter: Secondary | ICD-10-CM | POA: Diagnosis not present

## 2017-06-21 DIAGNOSIS — I428 Other cardiomyopathies: Secondary | ICD-10-CM

## 2017-06-21 DIAGNOSIS — E785 Hyperlipidemia, unspecified: Secondary | ICD-10-CM

## 2017-06-21 DIAGNOSIS — I10 Essential (primary) hypertension: Secondary | ICD-10-CM

## 2017-06-21 MED ORDER — CARVEDILOL 6.25 MG PO TABS: 6.2500 mg | ORAL_TABLET | Freq: Two times a day (BID) | ORAL | 6 refills | Status: DC

## 2017-06-21 NOTE — Patient Instructions (Addendum)
Medication Instructions:  Your physician has recommended you make the following change in your medication:  1. RESTART Carvedilol 6.25 mg twice daily 2. STOP Eliquis  Labwork: None ordered  Testing/Procedures: None ordered  Follow-Up: Your physician wants you to follow-up in: 6 months with Dr. Elberta Fortis.  You will receive a reminder letter in the mail two months in advance. If you don't receive a letter, please call our office to schedule the follow-up appointment.  * If you need a refill on your cardiac medications before your next appointment, please call your pharmacy.   *Please note that any paperwork needing to be filled out by the provider will need to be addressed at the front desk prior to seeing the provider. Please note that any FMLA, disability or other documents regarding health condition is subject to a $25.00 charge that must be received prior to completion of paperwork in the form of a money order or check.  Thank you for choosing [facility]!!   Dory Horn, RN [phone]

## 2017-06-21 NOTE — Telephone Encounter (Signed)
Advised to stop Eliquis. Patient verbalized understanding and agreeable to plan.

## 2017-06-21 NOTE — Progress Notes (Signed)
Electrophysiology Office Note   Date:  06/21/2017   ID:  Isaac Valdez, DOB 1961/02/21, MRN [id number]  PCP:  Isaac Plumber, MD  Cardiologist:  Isaac Valdez Primary Electrophysiologist:  Isaac Klingbeil Jorja Loa, MD    Chief Complaint  Patient presents with  . Follow-up    Post ablation/Typical AFlutter/Nonischemic cardiomyopathy     History of Present Illness: Isaac Valdez is a 57 y.o. male who is being seen today for the evaluation of atrial flutter at the request of Isaac Valdez. Presenting today for electrophysiology evaluation.  He has a history of CHF, diabetes, hypertension, hyperlipidemia.  He has a 30-pack-year history, quit smoking 5 years ago.  He also has typical atrial flutter.  Had atrial flutter ablation 04/21/17.  Today, denies symptoms of palpitations, chest pain, shortness of breath, orthopnea, PND, lower extremity edema, claudication, dizziness, presyncope, syncope, bleeding, or neurologic sequela. The patient is tolerating medications without difficulties.  Overall he is feeling well.  He has noticed no further episodes of atrial flutter.   Past Medical History:  Diagnosis Date  . Back pain   . CHF (congestive heart failure) (HCC)   . Diabetes mellitus   . Dyspnea   . Gout   . High cholesterol   . Hypertension    Past Surgical History:  Procedure Laterality Date  . A-FLUTTER ABLATION N/A 04/21/2017   Procedure: A-FLUTTER ABLATION;  Surgeon: Isaac Lemming, MD;  Location: [facility];  Service: Cardiovascular;  Laterality: N/A;  . BACK SURGERY    . MANDIBLE FRACTURE SURGERY       Current Outpatient Medications  Medication Sig Dispense Refill  . acetaminophen (TYLENOL) 500 MG tablet Take 1,000-1,500 mg by mouth every 6 (six) hours as needed (for back or leg pain.).    Marland Kitchen allopurinol (ZYLOPRIM) 300 MG tablet Take 300 mg by mouth daily as needed (for gout flare ups).    Marland Kitchen apixaban (ELIQUIS) 5 MG TABS tablet Take 5 mg by mouth 2 (two) times  daily.    . cloNIDine (CATAPRES) 0.2 MG tablet Take 0.2 mg by mouth as needed (elevated b/p).    Marland Kitchen colchicine 0.6 MG tablet Take 0.6 mg by mouth daily as needed (gout attack).    . esomeprazole (NEXIUM) 40 MG capsule Take 40 mg by mouth daily before breakfast.   3  . glipiZIDE (GLUCOTROL) 5 MG tablet Take 5 mg by mouth daily as needed (CBG >120).     Marland Kitchen spironolactone (ALDACTONE) 25 MG tablet Take 25 mg by mouth 2 (two) times daily.    Marland Kitchen torsemide (DEMADEX) 20 MG tablet Take 20 mg by mouth at bedtime.     No current facility-administered medications for this visit.     Allergies:   Aspirin   Social History:  The patient  reports that he has quit smoking. He smoked 0.50 packs per day. He has never used smokeless tobacco. He reports that he drinks alcohol. He reports that he does not use drugs.   Family History:  The patient's family history includes Heart disease in his mother; Heart failure in his son; Hypertension in his sister; Other in his father.    ROS:  Please see the history of present illness.   Otherwise, review of systems is positive for sweats, palpitations, dyspnea on exertion, back pain, joint swelling, headaches.   All other systems are reviewed and negative.   PHYSICAL EXAM: VS:  BP (!) 142/86   Pulse (!) 56   Ht 6' (1.829 m)  Wt (!) 320 lb (145.2 kg)   SpO2 99%   BMI 43.40 kg/m  , BMI Body mass index is 43.4 kg/m. GEN: Well nourished, well developed, in no acute distress  HEENT: normal  Neck: no JVD, carotid bruits, or masses Cardiac: RRR; no murmurs, rubs, or gallops,no edema  Respiratory:  clear to auscultation bilaterally, normal work of breathing GI: soft, nontender, nondistended, + BS MS: no deformity or atrophy  Skin: warm and dry Neuro:  Strength and sensation are intact Psych: euthymic mood, full affect  EKG:  EKG is ordered today. Personal review of the ekg ordered shows anus rhythm, rate 56, anterolateral T wave inversions  Recent Labs: 03/19/2017:  B Natriuretic Peptide 71.7 04/14/2017: Hemoglobin 11.4; Platelets 331 04/26/2017: BUN 38; Creatinine, Ser 1.83; Potassium 4.8; Sodium 139    Lipid Panel     Component Value Date/Time   CHOL 215 (H) 03/05/2016 0348   TRIG 241 (H) 03/05/2016 0348   HDL 58 03/05/2016 0348   CHOLHDL 3.7 03/05/2016 0348   VLDL 48 (H) 03/05/2016 0348   LDLCALC 109 (H) 03/05/2016 0348     Wt Readings from Last 3 Encounters:  06/21/17 (!) 320 lb (145.2 kg)  04/21/17 (!) 318 lb (144.2 kg)  04/14/17 (!) 327 lb (148.3 kg)      Other studies Reviewed: Additional studies/ records that were reviewed today include: TTE 02/19/17  Review of the above records today demonstrates:  - Left ventricle: The cavity size was normal. Wall thickness was   increased in a pattern of mild LVH. Systolic function was mildly   reduced. The estimated ejection fraction was in the range of 45%   to 50%. - Aortic valve: Mildly calcified annulus. There was mild   regurgitation.   ASSESSMENT AND PLAN:  1.  Typical atrial flutter: Currently on and Eliquis.  Status post atrial flutter ablation 04/21/17.  He stopped his carvedilol on his own.  He is in sinus rhythm today without much in the way of palpitations.  We Isaac Valdez stop his Eliquis.  This patients CHA2DS2-VASc Score and unadjusted Ischemic Stroke Rate (% per year) is equal to 2.2 % stroke rate/year from a score of 2  Above score calculated as 1 point each if present [CHF, HTN, DM, Vascular=MI/PAD/Aortic Plaque, Age if 65-74, or Male] Above score calculated as 2 points each if present [Age > 75, or Stroke/TIA/TE]   2.  Hypertension: Well-controlled today.  No changes.  3.  Hyperlipidemia: Not on statins.  Followed by PCP.  4.  Chronic systolic heart failure: Currently on optimal medical therapy with restarting carvedilol today.  5.  T wave inversions: New since ablation.  He is not having symptoms of chest pain or shortness of breath.  We Isaac Valdez continue to monitor and if  he has symptoms Isaac Valdez order stress testing versus coronary CT.  Current medicines are reviewed at length with the patient today.   The patient does not have concerns regarding his medicines.  The following changes were made today: Stop Eliquis, restart carvedilol  Labs/ tests ordered today include:  Orders Placed This Encounter  Procedures  . EKG 12-Lead     Disposition:   FU with Errik Mitchelle 6 months  Signed, Vonzella Althaus Jorja Loa, MD  06/21/2017 9:49 AM     [facility] [address] [place] [address] [phone] (office) [phone] (fax)

## 2017-06-21 NOTE — Telephone Encounter (Signed)
New Message:    Pt is calling to verify which medication Dr. Elberta Fortis wanted him to stop as of today.

## 2017-06-24 DIAGNOSIS — G249 Dystonia, unspecified: Secondary | ICD-10-CM | POA: Insufficient documentation

## 2017-06-25 ENCOUNTER — Emergency Department (HOSPITAL_COMMUNITY): Payer: Medicare HMO

## 2017-06-25 ENCOUNTER — Encounter (HOSPITAL_COMMUNITY): Payer: Self-pay

## 2017-06-25 ENCOUNTER — Emergency Department (HOSPITAL_COMMUNITY)
Admission: EM | Admit: 2017-06-25 | Discharge: 2017-06-25 | Disposition: A | Discharge status: Home / Self Care | Payer: Medicare HMO | Attending: Emergency Medicine | Admitting: Emergency Medicine

## 2017-06-25 DIAGNOSIS — I110 Hypertensive heart disease with heart failure: Secondary | ICD-10-CM | POA: Insufficient documentation

## 2017-06-25 DIAGNOSIS — E119 Type 2 diabetes mellitus without complications: Secondary | ICD-10-CM | POA: Insufficient documentation

## 2017-06-25 DIAGNOSIS — Z87891 Personal history of nicotine dependence: Secondary | ICD-10-CM | POA: Diagnosis not present

## 2017-06-25 DIAGNOSIS — I5022 Chronic systolic (congestive) heart failure: Secondary | ICD-10-CM | POA: Diagnosis not present

## 2017-06-25 DIAGNOSIS — G249 Dystonia, unspecified: Secondary | ICD-10-CM | POA: Diagnosis not present

## 2017-06-25 DIAGNOSIS — M542 Cervicalgia: Secondary | ICD-10-CM

## 2017-06-25 DIAGNOSIS — R0602 Shortness of breath: Secondary | ICD-10-CM | POA: Diagnosis present

## 2017-06-25 LAB — BASIC METABOLIC PANEL
Anion gap: 12 (ref 5–15)
BUN: 25 mg/dL — AB (ref 6–20)
CHLORIDE: 104 mmol/L (ref 101–111)
CO2: 23 mmol/L (ref 22–32)
CREATININE: 1.62 mg/dL — AB (ref 0.61–1.24)
Calcium: 9.6 mg/dL (ref 8.9–10.3)
GFR calc Af Amer: 54 mL/min — ABNORMAL LOW (ref >60)
GFR calc non Af Amer: 47 mL/min — ABNORMAL LOW (ref >60)
GLUCOSE: 118 mg/dL — AB (ref 65–99)
Potassium: 4.4 mmol/L (ref 3.5–5.1)
Sodium: 139 mmol/L (ref 135–145)

## 2017-06-25 LAB — CBC
HCT: 35.8 % — ABNORMAL LOW (ref 39.0–52.0)
Hemoglobin: 11.6 g/dL — ABNORMAL LOW (ref 13.0–17.0)
MCH: 30.3 pg (ref 26.0–34.0)
MCHC: 32.4 g/dL (ref 30.0–36.0)
MCV: 93.5 fL (ref 78.0–100.0)
PLATELETS: 272 10*3/uL (ref 150–400)
RBC: 3.83 MIL/uL — ABNORMAL LOW (ref 4.22–5.81)
RDW: 14.9 % (ref 11.5–15.5)
WBC: 4.2 10*3/uL (ref 4.0–10.5)

## 2017-06-25 LAB — I-STAT TROPONIN, ED: Troponin i, poc: 0.04 ng/mL (ref 0.00–0.08)

## 2017-06-25 MED ORDER — IOPAMIDOL (ISOVUE-300) INJECTION 61%
INTRAVENOUS | Status: AC
  Filled 2017-06-25: qty 100

## 2017-06-25 MED ORDER — ALBUTEROL SULFATE (2.5 MG/3ML) 0.083% IN NEBU
INHALATION_SOLUTION | RESPIRATORY_TRACT | Status: AC
  Filled 2017-06-25: qty 3

## 2017-06-25 MED ORDER — DIAZEPAM 5 MG PO TABS: 5.0000 mg | ORAL_TABLET | Freq: Three times a day (TID) | ORAL | 0 refills | Status: AC | PRN

## 2017-06-25 MED ORDER — IOPAMIDOL (ISOVUE-300) INJECTION 61%
100.0000 mL | Freq: Once | INTRAVENOUS | Status: AC | PRN
  Administered 2017-06-25: 100 mL via INTRAVENOUS

## 2017-06-25 MED ORDER — LORAZEPAM 2 MG/ML IJ SOLN
1.0000 mg | Freq: Once | INTRAMUSCULAR | Status: AC
  Administered 2017-06-25: 1 mg via INTRAVENOUS
  Filled 2017-06-25: qty 1

## 2017-06-25 MED ORDER — ALBUTEROL SULFATE (2.5 MG/3ML) 0.083% IN NEBU
5.0000 mg | INHALATION_SOLUTION | Freq: Once | RESPIRATORY_TRACT | Status: AC
  Administered 2017-06-25: 5 mg via RESPIRATORY_TRACT

## 2017-06-25 NOTE — ED Notes (Addendum)
Patient transported to CT 

## 2017-06-25 NOTE — ED Provider Notes (Signed)
Emergency Department Provider Note   I have reviewed the triage vital signs and the nursing notes.   HISTORY  Chief Complaint Shortness of Breath   HPI Isaac Valdez is a 57 y.o. male with PMH of CHF, HLD, HTN, and now 8 months of right neck spasm and pain presents to the emergency department for evaluation of continued neck spasms and resulting shortness of breath.  The patient states symptoms have been ongoing for approximately 8 months.  He has had a initial emergency department visit at which time Flexeril was prescribed but has had no relief in symptoms.  He has also been taking Benadryl, as instructed, with no improvement.  He followed up with an outpatient neurologist for the symptoms yesterday and was started on amantadine and scheduled for outpatient MRI as well as additional blood work.  Preliminary diagnosis of Dyskinesia was made pending further investigation.  Patient has been taking the medications as instructed and has recently followed up with his cardiologist but symptoms continue.  He denies any chest pain or palpitations today.  The patient has a all up appointment with his neurologist next week for further testing but is today mostly out of desperation to find some relief for the twitching.  He states he has pain mainly in the right lateral neck.  Denies difficulty swallowing.  Patient states that his shortness of breath seems to stem from his twitching pain and describes it as momentary episodes of losing his breath or deep breathing because of spasming. Symptoms are improved with eating or chewing gum which he does almost constantly now. Denies any ETOH, illicit drugs, or herbal supplements.    Past Medical History:  Diagnosis Date  . Back pain   . CHF (congestive heart failure) (HCC)   . Diabetes mellitus   . Dyspnea   . Gout   . High cholesterol   . Hypertension     Patient Active Problem List   Diagnosis Date Noted  . AKI (acute kidney injury) (HCC)  03/19/2017  . GERD (gastroesophageal reflux disease) 03/19/2017  . 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 Gout 03/19/2017  . SOB (shortness of breath) 03/19/2017  . Hyperkalemia 03/19/2017  . Leukocytosis 03/19/2017  . Acute renal failure (HCC)   . Moderate to severe mitral regurgitation 10/06/2016  . Hypertensive urgency 03/05/2016  . Chest pain 03/05/2016  . Chronic systolic heart failure (HCC) 03/05/2016  . Hypertension   . High cholesterol   . Typical atrial flutter (HCC)   . Metabolic syndrome   . Atrial flutter (HCC) 03/04/2016    Past Surgical History:  Procedure Laterality Date  . A-FLUTTER ABLATION N/A 04/21/2017   Procedure: A-FLUTTER ABLATION;  Surgeon: Regan Lemming, MD;  Location: [facility];  Service: Cardiovascular;  Laterality: N/A;  . BACK SURGERY    . MANDIBLE FRACTURE SURGERY      Current Outpatient Rx  . Order #: [id number] Class: Historical Med  . Order #: [id number] Class: Historical Med  . Order #: [id number] Class: Normal  . Order #: [id number] Class: Historical Med  . Order #: [id number] Class: Historical Med  . Order #: [id number] Class: Print  . Order #: [id number] Class: Historical Med  . Order #: [id number] Class: Historical Med  . Order #: [id number] Class: Historical Med  . Order #: [id number] Class: Historical Med    Allergies Aspirin  Family History  Problem Relation Age of Onset  . Heart disease Mother   . Hypertension Sister   . Other Father  hypothermia  . Heart failure Son     Social History Social History   Tobacco Use  . Smoking status: Former Smoker    Packs/day: 0.50  . Smokeless tobacco: Never Used  Substance Use Topics  . Alcohol use: Yes    Comment: rarely  . Drug use: No    Review of Systems  Constitutional: No fever/chills Eyes: No visual changes. ENT: No sore throat. Right neck pain and twitching.  Cardiovascular: Denies chest pain. Respiratory: Positive shortness of  breath. Gastrointestinal: No abdominal pain.  No nausea, no vomiting.  No diarrhea.  No constipation. Genitourinary: Negative for dysuria. Musculoskeletal: Negative for back pain. Skin: Negative for rash. Neurological: Negative for headaches, focal weakness or numbness.  10-point ROS otherwise negative.  ____________________________________________   PHYSICAL EXAM:  VITAL SIGNS: ED Triage Vitals  Enc Vitals Group     BP 06/25/17 1110 (!) 114/51     Pulse Rate 06/25/17 1110 64     Resp 06/25/17 1110 (!) 22     Temp 06/25/17 1110 98.2 F (36.8 C)     Temp Source 06/25/17 1110 Oral     SpO2 06/25/17 1110 100 %     Weight 06/25/17 1122 (!) 311 lb (141.1 kg)     Height 06/25/17 1122 5' 11.75" (1.822 m)     Pain Score 06/25/17 1122 0   Constitutional: Alert and oriented. Well appearing and in no acute distress. Eyes: Conjunctivae are normal.  Head: Atraumatic. Ears:  Healthy appearing ear canals and TMs bilaterally Nose: No congestion/rhinnorhea. Mouth/Throat: Mucous membranes are moist.  Oropharynx non-erythematous. No PTA. Patient is managing oral secretions with frequent neck/tongue jerking movements.  Neck: No stridor. Mild tenderness to the right lateral neck.  Cardiovascular: Normal rate, regular rhythm. Good peripheral circulation. Grossly normal heart sounds.   Respiratory: Normal respiratory effort.  No retractions. Lungs CTAB. Gastrointestinal: Soft and nontender. No distention.  Musculoskeletal: No lower extremity tenderness nor edema. No gross deformities of extremities. Neurologic:  Normal speech and language. No gross focal neurologic deficits are appreciated.  Skin:  Skin is warm, dry and intact. No rash noted.  ____________________________________________   LABS (all labs ordered are listed, but only abnormal results are displayed)  Labs Reviewed  BASIC METABOLIC PANEL - Abnormal; Notable for the following components:      Result Value   Glucose, Bld 118  (*)    BUN 25 (*)    Creatinine, Ser 1.62 (*)    GFR calc non Af Amer 47 (*)    GFR calc Af Amer 54 (*)    All other components within normal limits  CBC - Abnormal; Notable for the following components:   RBC 3.83 (*)    Hemoglobin 11.6 (*)    HCT 35.8 (*)    All other components within normal limits  I-STAT TROPONIN, ED   ____________________________________________  EKG   EKG Interpretation  Date/Time:  Friday June 25 2017 11:06:04 EDT Ventricular Rate:  61 PR Interval:  174 QRS Duration: 96 QT Interval:  456 QTC Calculation: 459 R Axis:   69 Text Interpretation:  Normal sinus rhythm T wave abnormality, consider anterolateral ischemia Abnormal ECG No STEMI but new T wave inversions.  Confirmed by Alona Bene [phone]) on 06/25/2017 12:13:06 PM       ____________________________________________  RADIOLOGY  Dg Chest 2 View  Result Date: 06/25/2017 CLINICAL DATA:  Shortness of breath. EXAM: CHEST - 2 VIEW COMPARISON:  03/19/2017 and 09/27/2016 FINDINGS: The heart size and mediastinal  contours are within normal limits. Both lungs are clear and there are no effusions. The visualized skeletal structures are unremarkable. IMPRESSION: No active cardiopulmonary disease. Electronically Signed   By: Francene Boyers M.D.   On: 06/25/2017 12:29   Ct Soft Tissue Neck W Contrast  Result Date: 06/25/2017 CLINICAL DATA:  Sore throat and stridor. Difficulty taking a full breath. Sleeps with BiPAP. EXAM: CT NECK WITH CONTRAST TECHNIQUE: Multidetector CT imaging of the neck was performed using the standard protocol following the bolus administration of intravenous contrast. CONTRAST:  ISOVUE-300 IOPAMIDOL (ISOVUE-300) INJECTION 61% COMPARISON:  None. FINDINGS: PHARYNX AND LARYNX: --Nasopharynx: Fossae of Rosenmuller are clear. Normal adenoid tonsils for age. --Oral cavity and oropharynx: Soft tissue prominence in the posterior oropharynx is compatible with obstructive sleep apnea. There  is no abnormal contrast enhancement of the palatine tonsils. No peritonsillar fluid collection. The visible floor of mouth and tongue base are normal. --Hypopharynx: Mild prominence of the lingual tonsils and/or small amount of retained food material in the vallecula. Normal piriform sinuses. --Larynx: Normal epiglottis and pre-epiglottic space. Normal aryepiglottic and vocal folds. --Retropharyngeal space: No abscess, effusion or lymphadenopathy. SALIVARY GLANDS: --Parotid: No mass lesion or inflammation. No sialolithiasis or ductal dilatation. --Submandibular: Symmetric without inflammation. No sialolithiasis or ductal dilatation. --Sublingual: Normal. No ranula or other visible lesion of the base of tongue and floor of mouth. THYROID: Normal. LYMPH NODES: No enlarged or abnormal density lymph nodes. VASCULAR: Major cervical vessels are patent. LIMITED INTRACRANIAL: Normal. VISUALIZED ORBITS: Normal. MASTOIDS AND VISUALIZED PARANASAL SINUSES: Left maxillary retention cysts. SKELETON: No bony spinal canal stenosis. Sclerotic focus in the C5 vertebral body is probably a benign enostosis. UPPER CHEST: Clear. OTHER: None. IMPRESSION: 1. No epiglottitis, peritonsillar abscess or retropharyngeal collection. 2. Mildly prominent palatine and lingual tonsils without associated inflammatory change. In the appropriate setting, this could indicate acute tonsillopharyngitis, but also may commonly be seen in the context of obstructive sleep apnea. Electronically Signed   By: Deatra Robinson M.D.   On: 06/25/2017 14:31    ____________________________________________   PROCEDURES  Procedure(s) performed:   Procedures  None ____________________________________________   INITIAL IMPRESSION / ASSESSMENT AND PLAN / ED COURSE  Pertinent labs & imaging results that were available during my care of the patient were reviewed by me and considered in my medical decision making (see chart for details).  Patient presents  to the emergency department for evaluation of episodic neck pain/spasm symptoms with associated shortness of breath.  No chest pain.  The patient's EKG shows T wave inversions in the anterior lateral leads which is also documented on his recent cardiology visit.  Not experiencing any new or worsening shortness of breath.  No chest pain.  No reason to suspect atypical ACS.  His symptoms all seem to be stemming from his dyskinesia.  He has no other abnormal movements that I can appreciate.  In review of his medications I do not see any particular medication which I would suspect to cause dystonic reaction.  Given his right neck pain plan for CT of the soft tissue neck along with lab work and Ativan.  Doubt this is an ENT issue and suspect that he is seeing the appropriate outpatient specialists.  Will see if benzodiazepines control his symptoms at all.  Would also encourage continued Benadryl use.  02:44 PM Lab work and CT imaging reviewed with no acute abnormality.  Suspect OSA is the reason for the patient's slightly inflamed tonsils.  No exudate or erythema on exam.  Patient is feeling better after Ativan.  I encouraged him to continue following up with neurology and will prescribe Valium to take as needed for severe symptoms.  I did caution him not to drive a car or take this medication with pain medication or muscle relaxer.   At this time, I do not feel there is any life-threatening condition present. I have reviewed and discussed all results (EKG, imaging, lab, urine as appropriate), exam findings with patient. I have reviewed nursing notes and appropriate previous records.  I feel the patient is safe to be discharged home without further emergent workup. Discussed usual and customary return precautions. Patient and family (if present) verbalize understanding and are comfortable with this plan.  Patient will follow-up with their primary care provider. If they do not have a primary care provider,  information for follow-up has been provided to them. All questions have been answered.  ____________________________________________  FINAL CLINICAL IMPRESSION(S) / ED DIAGNOSES  Final diagnoses:  Dyskinesia  Neck pain on right side  SOB (shortness of breath)     MEDICATIONS GIVEN DURING THIS VISIT:  Medications  albuterol (PROVENTIL) (2.5 MG/3ML) 0.083% nebulizer solution (has no administration in time range)  iopamidol (ISOVUE-300) 61 % injection (has no administration in time range)  albuterol (PROVENTIL) (2.5 MG/3ML) 0.083% nebulizer solution 5 mg (5 mg Nebulization Given 06/25/17 1135)  LORazepam (ATIVAN) injection 1 mg (1 mg Intravenous Given 06/25/17 1258)  iopamidol (ISOVUE-300) 61 % injection 100 mL (100 mLs Intravenous Contrast Given 06/25/17 1401)     NEW OUTPATIENT MEDICATIONS STARTED DURING THIS VISIT:  New Prescriptions   DIAZEPAM (VALIUM) 5 MG TABLET    Take 1 tablet (5 mg total) by mouth every 8 (eight) hours as needed for up to 5 days for muscle spasms.    Note:  This document was prepared using Dragon voice recognition software and may include unintentional dictation errors.  Alona Bene, MD Emergency Medicine    Long, Arlyss Repress, MD 06/25/17 [phone]

## 2017-06-25 NOTE — ED Notes (Signed)
Patient transported to X-ray 

## 2017-06-25 NOTE — ED Triage Notes (Signed)
Pt. Is having a sensation that he cannot take a full breath through his nose out through his mouth. Pt. Reports that it began like a nervous twitch on his rt. Jaw..  He reports that he sleeps with BIPAP and it helps with the feeling of sob.  Pt. Is talking in full sentences.  Pt. Denies any pain or discomfort  Pt. Does not want oxygen sats are 100%.

## 2017-06-25 NOTE — ED Notes (Signed)
ED Provider at bedside. 

## 2017-06-25 NOTE — ED Notes (Signed)
Pt verbalized understanding of all d/c instructions, prescriptions, and f/u information. Opportunity for questioning and answers provided. VSS. All belongings with patient at this time. Pt ambulatory to lobby with steady gait.   

## 2017-06-25 NOTE — Discharge Instructions (Signed)
You were seen in the ED today with neck pain and difficulty breathing. You will need to continue seeing your Neurologist and take the medication provided only as needed for severe symptoms. Do not drive a car while taking this medication. Do not take Valium with any other pain medication or muscle relaxer. Return to the ED with any chest pain, difficulty breathing, fever, or other concerning symptoms.

## 2017-08-16 DIAGNOSIS — M79602 Pain in left arm: Secondary | ICD-10-CM | POA: Insufficient documentation

## 2017-10-05 ENCOUNTER — Telehealth: Payer: Self-pay | Admitting: Cardiovascular Disease

## 2017-10-05 NOTE — Telephone Encounter (Signed)
Spoke with the patient and he reports being in [facility] over the weekend and was told that his heart went out of rhythm. He refuses to make an appointment with our PA/NP and says he needs an appt with Dr. Allyson Sabal before his next available in 11/2017. Pt says he has felt well since coming home on Saturday 10/02/17. He said they told him he went back into a "normal' rhythm shortly after his arrhythmia. I advised him that I will call, and get his medical records from the hospital and talk with Dr. Allyson Sabal nurse to see if they can recommend when the pt needs to follow up. Spoke with Helmut Muster with [facility] hospital medical records and she is faxing his chart to [place].

## 2017-10-05 NOTE — Telephone Encounter (Signed)
Appointment made with Dr. Allyson Sabal 10/08/17

## 2017-10-05 NOTE — Telephone Encounter (Signed)
New message    Pt was recently in the hospital and was released he was advised that he needs to follow up with his cardiologist. He was just released from [facility]. . I offered pt appt on 11/11/2017 he declined and also declined an appt. w/PA.

## 2017-10-08 ENCOUNTER — Encounter: Payer: Self-pay | Admitting: Cardiovascular Disease

## 2017-10-08 ENCOUNTER — Emergency Department (HOSPITAL_COMMUNITY): Payer: Medicare HMO

## 2017-10-08 ENCOUNTER — Emergency Department (HOSPITAL_COMMUNITY)
Admission: EM | Admit: 2017-10-08 | Discharge: 2017-10-08 | Disposition: A | Discharge status: Home / Self Care | Payer: Medicare HMO | Attending: Emergency Medicine | Admitting: Emergency Medicine

## 2017-10-08 ENCOUNTER — Encounter (HOSPITAL_COMMUNITY): Payer: Self-pay | Admitting: Emergency Medicine

## 2017-10-08 ENCOUNTER — Ambulatory Visit: Payer: Medicare HMO | Admitting: Cardiovascular Disease

## 2017-10-08 VITALS — BP 143/63 | HR 58 | Ht 71.0 in | Wt 300.6 lb

## 2017-10-08 DIAGNOSIS — R0602 Shortness of breath: Secondary | ICD-10-CM

## 2017-10-08 DIAGNOSIS — F410 Panic disorder [episodic paroxysmal anxiety] without agoraphobia: Secondary | ICD-10-CM | POA: Diagnosis present

## 2017-10-08 DIAGNOSIS — E119 Type 2 diabetes mellitus without complications: Secondary | ICD-10-CM | POA: Diagnosis not present

## 2017-10-08 DIAGNOSIS — Z79899 Other long term (current) drug therapy: Secondary | ICD-10-CM | POA: Insufficient documentation

## 2017-10-08 DIAGNOSIS — M62838 Other muscle spasm: Secondary | ICD-10-CM | POA: Diagnosis not present

## 2017-10-08 DIAGNOSIS — I5022 Chronic systolic (congestive) heart failure: Secondary | ICD-10-CM | POA: Diagnosis not present

## 2017-10-08 DIAGNOSIS — Z87891 Personal history of nicotine dependence: Secondary | ICD-10-CM | POA: Insufficient documentation

## 2017-10-08 DIAGNOSIS — I110 Hypertensive heart disease with heart failure: Secondary | ICD-10-CM | POA: Insufficient documentation

## 2017-10-08 DIAGNOSIS — Z7984 Long term (current) use of oral hypoglycemic drugs: Secondary | ICD-10-CM | POA: Diagnosis not present

## 2017-10-08 LAB — I-STAT CHEM 8, ED
BUN: 46 mg/dL — AB (ref 6–20)
CREATININE: 1.9 mg/dL — AB (ref 0.61–1.24)
Calcium, Ion: 1.08 mmol/L — ABNORMAL LOW (ref 1.15–1.40)
Chloride: 104 mmol/L (ref 98–111)
Glucose, Bld: 87 mg/dL (ref 70–99)
HCT: 43 % (ref 39.0–52.0)
HEMOGLOBIN: 14.6 g/dL (ref 13.0–17.0)
POTASSIUM: 3.6 mmol/L (ref 3.5–5.1)
Sodium: 142 mmol/L (ref 135–145)
TCO2: 28 mmol/L (ref 22–32)

## 2017-10-08 LAB — I-STAT TROPONIN, ED: Troponin i, poc: 0 ng/mL (ref 0.00–0.08)

## 2017-10-08 MED ORDER — DIPHENHYDRAMINE HCL 50 MG/ML IJ SOLN
25.0000 mg | Freq: Once | INTRAMUSCULAR | Status: AC
  Administered 2017-10-08: 25 mg via INTRAVENOUS
  Filled 2017-10-08: qty 1

## 2017-10-08 MED ORDER — ALBUTEROL SULFATE (2.5 MG/3ML) 0.083% IN NEBU
5.0000 mg | INHALATION_SOLUTION | Freq: Once | RESPIRATORY_TRACT | Status: AC
  Administered 2017-10-08: 5 mg via RESPIRATORY_TRACT
  Filled 2017-10-08: qty 6

## 2017-10-08 MED ORDER — LORAZEPAM 1 MG PO TABS: 1.0000 mg | ORAL_TABLET | Freq: Three times a day (TID) | ORAL | 0 refills | Status: DC | PRN

## 2017-10-08 MED ORDER — LORAZEPAM 2 MG/ML IJ SOLN
1.0000 mg | Freq: Once | INTRAMUSCULAR | Status: AC
  Administered 2017-10-08: 1 mg via INTRAVENOUS
  Filled 2017-10-08: qty 1

## 2017-10-08 NOTE — ED Triage Notes (Signed)
Patient here from Cardiology appt with complaints of SOB and panic attacks. Reports that "everything checks out fine every time I go to the dr". Take lorazepam but it out of meds. Does report a facial tic also.

## 2017-10-08 NOTE — Discharge Instructions (Addendum)
Please follow up with your neurologist in regards to your visit in the hospital today.   Please take medications as recommended in your discharge paperwork. Follow up with your primary doctor as well in the next 3 days for reevaluation.  Return to the emergency department for any new or worsening symptoms including shortness of breath, chest pain.

## 2017-10-08 NOTE — ED Notes (Signed)
ED Provider at bedside. 

## 2017-10-08 NOTE — ED Notes (Signed)
Pt requesting benadryl, reports that it has been given and that it helps. PA Courtni made aware.

## 2017-10-08 NOTE — ED Provider Notes (Signed)
[facility]-EMERGENCY DEPT Provider Note   CSN: [id number] Arrival date & time: 10/08/17  1025     History   Chief Complaint Chief Complaint  Patient presents with  . Shortness of Breath  . Panic Attack    HPI Isaac Valdez is a 57 y.o. male.  HPI   Patient Is a 57 year old male with a history of back pain, CHF, T2DM, high cholesterol, hypertension who presents emergency department today for evaluation of a panic attack which began prior to arrival. PT states that his panic attacks consist of shortness of breath and muscle twitching to his face and neck. The panic attack occurred while he was at his cardiologist office prior to arrival.  Patient says that he has had similar attacks over the last year.  Symptoms today feel exactly like prior attacks.  He has been seen in multiple emergency departments and has been being worked up by neurology for this.  Has been prescribed tetrabenazine as well as Ativan for the symptoms.  States that he has been taking tetrabenazine with no relief however Ativan and Benadryl seem to improve his symptoms the most.  States he ran out of Ativan.  He denies any chest pain after symptoms.  No lower extremity swelling.  Reviewed prior notes and patient was seen by his cardiologist prior to arrival and sent here for further evaluation when he developed shortness of breath.  His EKG and O2 sats were normal at his cardiology office.  Past Medical History:  Diagnosis Date  . Back pain   . CHF (congestive heart failure) (HCC)   . Diabetes mellitus   . Dyspnea   . Gout   . High cholesterol   . Hypertension     Patient Active Problem List   Diagnosis Date Noted  . AKI (acute kidney injury) (HCC) 03/19/2017  . GERD (gastroesophageal reflux disease) 03/19/2017  . 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 Gout 03/19/2017  . SOB (shortness of breath) 03/19/2017  . Hyperkalemia 03/19/2017  . Leukocytosis  03/19/2017  . Acute renal failure (HCC)   . Moderate to severe mitral regurgitation 10/06/2016  . Hypertensive urgency 03/05/2016  . Chest pain 03/05/2016  . Chronic systolic heart failure (HCC) 03/05/2016  . Hypertension   . High cholesterol   . Typical atrial flutter (HCC)   . Metabolic syndrome   . Atrial flutter (HCC) 03/04/2016    Past Surgical History:  Procedure Laterality Date  . A-FLUTTER ABLATION N/A 04/21/2017   Procedure: A-FLUTTER ABLATION;  Surgeon: Regan Lemming, MD;  Location: [facility] INVASIVE CV LAB;  Service: Cardiovascular;  Laterality: N/A;  . BACK SURGERY    . MANDIBLE FRACTURE SURGERY          Home Medications    Prior to Admission medications   Medication Sig Start Date End Date Taking? Authorizing Provider  allopurinol (ZYLOPRIM) 300 MG tablet Take 300 mg by mouth daily as needed (for gout flare ups).   Yes [provider]  carvedilol (COREG) 6.25 MG tablet Take 1 tablet (6.25 mg total) by mouth 2 (two) times daily. 06/21/17  Yes Camnitz, Andree Coss, MD  cloNIDine (CATAPRES) 0.2 MG tablet Take 0.2 mg by mouth as needed (elevated b/p).   Yes [provider]  colchicine 0.6 MG tablet Take 0.6 mg by mouth daily as needed (gout attack).   Yes [provider]  glipiZIDE (GLUCOTROL) 5 MG tablet Take 5 mg by mouth daily as needed (CBG >120).  08/12/16  Yes [provider]  predniSONE (DELTASONE) 5 MG tablet Take 5 mg by mouth See admin instructions. 21 tablet dose package. Take 6 tabs day 1, 5 tabs day 2, 4 tabs day 3, 3 tabs day 4, 2 tab day 5, 1 tab day 6.   Yes [provider]  spironolactone (ALDACTONE) 25 MG tablet Take 25 mg by mouth 2 (two) times daily.   Yes [provider]  tetrabenazine (XENAZINE) 12.5 MG tablet Take 12.5 mg by mouth 3 (three) times daily.   Yes [provider]  torsemide (DEMADEX) 20 MG tablet Take 20 mg by mouth at bedtime.   Yes [provider]  traMADol (ULTRAM)  50 MG tablet Take 50 mg by mouth every 8 (eight) hours as needed for severe pain.   Yes [provider]  LORazepam (ATIVAN) 1 MG tablet Take 1 tablet (1 mg total) by mouth every 8 (eight) hours as needed for anxiety. 10/08/17   Aleeya Veitch S, PA-C    Family History Family History  Problem Relation Age of Onset  . Heart disease Mother   . Hypertension Sister   . Other Father        hypothermia  . Heart failure Son     Social History Social History   Tobacco Use  . Smoking status: Former Smoker    Packs/day: 0.50  . Smokeless tobacco: Never Used  Substance Use Topics  . Alcohol use: Yes    Comment: rarely  . Drug use: No     Allergies   Aspirin   Review of Systems Review of Systems  Constitutional: Negative for fever.  HENT: Negative for congestion and rhinorrhea.        Facial/neck twitching  Eyes: Negative for visual disturbance.  Respiratory: Positive for shortness of breath. Negative for cough and wheezing.   Cardiovascular: Negative for chest pain and leg swelling.  Gastrointestinal: Negative for abdominal pain, nausea and vomiting.  Genitourinary: Negative for flank pain.  Musculoskeletal: Negative for back pain.  Skin: Negative for color change.  Neurological: Negative for headaches.   Physical Exam Updated Vital Signs BP (!) 123/59 (BP Location: Left Arm)   Pulse 72   Temp 98 F (36.7 C) (Oral)   Resp (!) 28   SpO2 94%   Physical Exam  Constitutional: He appears well-developed and well-nourished.  Appears uncomfortable. Frequent facial tic noted (flexion of platysma muscle)  HENT:  Head: Normocephalic and atraumatic.  Mouth/Throat: Oropharynx is clear and moist.  No angioedema.  No pharyngeal erythema or tonsillar swelling.  Uvula midline.  Tolerating secretions.  No hot potato voice.  Eyes: Conjunctivae are normal.  Neck: Neck supple.  Cardiovascular: Normal rate, regular rhythm and normal heart sounds.  No murmur  heard. Pulmonary/Chest: Effort normal and breath sounds normal. No respiratory distress. He has no decreased breath sounds. He has no wheezes.  Satting at 100% on room air with no tachypnea on monitor.  Speaking in full sentences.  Abdominal: Soft. Bowel sounds are normal. There is no tenderness.  Musculoskeletal: Normal range of motion. He exhibits no edema.       Right lower leg: Normal. He exhibits no tenderness and no edema.       Left lower leg: Normal. He exhibits no tenderness and no edema.  Neurological: He is alert.  Skin: Skin is warm and dry. Capillary refill takes less than 2 seconds.  Psychiatric: His mood appears anxious.  Nursing note and vitals reviewed.   ED  Treatments / Results  Labs (all labs ordered are listed, but only abnormal results are displayed) Labs Reviewed  I-STAT CHEM 8, ED - Abnormal; Notable for the following components:      Result Value   BUN 46 (*)    Creatinine, Ser 1.90 (*)    Calcium, Ion 1.08 (*)    All other components within normal limits  I-STAT TROPONIN, ED    EKG EKG Interpretation  Date/Time:  Friday October 08 2017 12:17:51 EDT Ventricular Rate:  66 PR Interval:    QRS Duration: 100 QT Interval:  395 QTC Calculation: 414 R Axis:   60 Text Interpretation:  Sinus rhythm Nonspecific T wave abnormality Confirmed by Cathren Laine ([id number]) on 10/08/2017 12:36:06 PM   Radiology Dg Chest 2 View  Result Date: 10/08/2017 CLINICAL DATA:  Shortness of Breath EXAM: CHEST - 2 VIEW COMPARISON:  09/05/2017 FINDINGS: The heart size and mediastinal contours are within normal limits. Both lungs are clear. The visualized skeletal structures shows degenerative changes of the thoracic spine. IMPRESSION: No active cardiopulmonary disease. Electronically Signed   By: Alcide Clever M.D.   On: 10/08/2017 11:17    Procedures Procedures (including critical care time)  Medications Ordered in ED Medications  albuterol (PROVENTIL) (2.5 MG/3ML) 0.083%  nebulizer solution 5 mg (5 mg Nebulization Given 10/08/17 1106)  LORazepam (ATIVAN) injection 1 mg (1 mg Intravenous Given 10/08/17 1150)  diphenhydrAMINE (BENADRYL) injection 25 mg (25 mg Intravenous Given 10/08/17 1247)     Initial Impression / Assessment and Plan / ED Course  I have reviewed the triage vital signs and the nursing notes.  Pertinent labs & imaging results that were available during my care of the patient were reviewed by me and considered in my medical decision making (see chart for details).    patient tells me that he has been prescribed Ativan by his primary care doctor but has also been given short courses of Ativan after being seen in the ED.  [place] narcotic database was queried and patient's last prescription for Ativan was on 09/21/2017 when he received 14 tablets.  Final Clinical Impressions(s) / ED Diagnoses   Final diagnoses:  Panic attack  Muscle spasm   Patient presenting with muscle spasms to his face causing him to feel short of breath and have a panic attack.  He has had multiple episodes of similar symptoms have occurred over the past year.  He is currently being worked up by neurology for his symptoms.  Records were reviewed and he had a CT soft tissue neck in 06/2017 which did not show any abnormalities to cause the patient's symptoms.  He has no evidence of angioedema today and has a normal voice.  Tolerating secretions and has had normal O2 sats throughout his stay in the ED.  Exam today is not consistent with anaphylactic reaction.  Patient has facial tic causing him to flex his platysmas muscle.  After Ativan and Benadryl the symptoms subsided and patient felt improved.  Lab work was completed which showed a negative troponin.  I-STAT Chem-8 with elevated creatinine which is baseline for patient.  Chest x-ray was negative for pneumonia or other abnormality.  EKG with nonspecific T wave abnormality but no obvious ischemic changes or STEMI.  Normal sinus  rhythm.  Unclear etiology of patient's facial spasms however he is currently being worked up for this as an outpatient and has been prescribed medication for this by his neurologist.  He states his neurologist plans to increase this  medication as it is currently not improving his symptoms.  He actually spoke with him on the phone during his visit and will be increasing his tetrabenazine starting today.  We will give him a short course of Ativan until he can speak with his primary care doctor to receive a full prescription.  [employer] queried as above.  Advised patient to follow-up with his neurologist and his PCP.  Return precautions were discussed and patient expresses understanding of the plan agrees to return.  All questions answered.  ED Discharge Orders        Ordered    LORazepam (ATIVAN) 1 MG tablet  Every 8 hours PRN     10/08/17 1345       Isaac Valdez S, PA-C 10/08/17 1407    Cathren Laine, MD 10/12/17 [phone]

## 2017-10-08 NOTE — Progress Notes (Signed)
Mr. Sudbury was being seen today however he became acutely short of breath upper airway symptoms and we elected to transported by EMS to the ER for further evaluation.  His vital signs were stable.  Lungs were clear.  His EKG showed no acute changes and his oxygen child saturations were 100%.  Runell Gess, M.D., FACP, [facility], [facility] [address] [place], [place]  [address]  [phone] 10/08/2017 10:17 AM

## 2017-10-14 ENCOUNTER — Encounter: Payer: Self-pay | Admitting: Neurology

## 2017-10-27 NOTE — Progress Notes (Signed)
Isaac Valdez was seen today in the movement disorders clinic for neurologic consultation at the request of Denese Killings., MD.  The consultation is for the evaluation of dyskinesia/abnormal movements in the face.  The records that were made available to me were reviewed.  Patient reports that symptoms started about 1 year ago.  He states that it seemed to start in the R arm but that is gone.  It is mostly in the R neck and R jaw.  The mouth and jaw are definitely the biggest issue.  He is not having trouble swallowing.  He notes that if he drinks 4-5 beers it will get better.  He states that the tongue is always moving and he cannot control it.  His mouth muscles are pulling open.  When he sleeps he uses bipap and he feels better but he will wake up biting the tongue.  He has started wearing a mouth piece at night.  That helps some.  He does describe some sensory tricks (put toothpick in between teeth but ended up having a "gasp" and it went into back of throat).    Pt does have a consult with Dr. Rubin Payor in 07/2018.  He was seen at [facility] ED on 08/02/17 by a neurology resident for dyskinesia.  It does not appear that he followed up there, with the exception of the emergency room.  The patient has tried amantadine without success.  He tried it for 2-3 weeks, 3 times per day.  They tried to get approval for Ingrezza, but given the diagnosis, was not approved. He tried xenazine but was only able to take it for 2 weeks and he had to d/c because of cost.  He tried valium without success, but doesn't know the appt.  He is now taking ativan prn; he last used it 3 days ago because he is feels panic attacks because of the movements.  No exposure to reglan or antipsychotics.     Neuroimaging of the brain has  previously been performed.  It is not available for my review today.  Reports are available, but no images.  Patient had an MRI of the brain without gadolinium on July 04, 2017 through [facility].  This was  reported to be normal.  PREVIOUS MEDICATIONS: amantadine, benadryl, xenazine for only 2 weeks because of cost, valium, ativan; insurance denied ingrezza  ALLERGIES:   Allergies  Allergen Reactions  . Aspirin Other (See Comments)    "indigestion"    CURRENT MEDICATIONS:  Outpatient Encounter Medications as of 11/01/2017  Medication Sig  . allopurinol (ZYLOPRIM) 300 MG tablet Take 300 mg by mouth daily as needed (for gout flare ups).  Marland Kitchen atorvastatin (LIPITOR) 10 MG tablet Take by mouth.  . carvedilol (COREG) 6.25 MG tablet Take 1 tablet (6.25 mg total) by mouth 2 (two) times daily.  . cloNIDine (CATAPRES) 0.2 MG tablet Take 0.2 mg by mouth as needed (elevated b/p).  Marland Kitchen glipiZIDE (GLUCOTROL) 5 MG tablet Take 5 mg by mouth daily as needed (CBG >120).   . LORazepam (ATIVAN) 1 MG tablet Take 1 tablet (1 mg total) by mouth every 8 (eight) hours as needed for anxiety.  Marland Kitchen spironolactone (ALDACTONE) 25 MG tablet Take 25 mg by mouth 2 (two) times daily.  Marland Kitchen torsemide (DEMADEX) 20 MG tablet Take 20 mg by mouth at bedtime.  . [DISCONTINUED] colchicine 0.6 MG tablet Take 0.6 mg by mouth daily as needed (gout attack).  . [DISCONTINUED] predniSONE (DELTASONE) 5 MG tablet Take  5 mg by mouth See admin instructions. 21 tablet dose package. Take 6 tabs day 1, 5 tabs day 2, 4 tabs day 3, 3 tabs day 4, 2 tab day 5, 1 tab day 6.  . [DISCONTINUED] tetrabenazine (XENAZINE) 12.5 MG tablet Take 12.5 mg by mouth 3 (three) times daily.  . [DISCONTINUED] traMADol (ULTRAM) 50 MG tablet Take 50 mg by mouth every 8 (eight) hours as needed for severe pain.   No facility-administered encounter medications on file as of 11/01/2017.     PAST MEDICAL HISTORY:   Past Medical History:  Diagnosis Date  . Back pain   . CHF (congestive heart failure) (HCC)   . Diabetes mellitus   . Dyspnea   . Gout   . High cholesterol   . Hypertension     PAST SURGICAL HISTORY:   Past Surgical History:  Procedure Laterality Date  .  A-FLUTTER ABLATION N/A 04/21/2017   Procedure: A-FLUTTER ABLATION;  Surgeon: Regan Lemming, MD;  Location: [facility] INVASIVE CV LAB;  Service: Cardiovascular;  Laterality: N/A;  . BACK SURGERY    . MANDIBLE FRACTURE SURGERY      SOCIAL HISTORY:   Social History   Socioeconomic History  . Marital status: Married    Spouse name: Not on file  . Number of children: 2  . Years of education: 22  . Highest education level: Not on file  Occupational History  . Occupation: part time truck Hospital doctor  . Occupation: on disability  Social Needs  . Financial resource strain: Not on file  . Food insecurity:    Worry: Not on file    Inability: Not on file  . Transportation needs:    Medical: Not on file    Non-medical: Not on file  Tobacco Use  . Smoking status: Former Smoker    Packs/day: 0.50  . Smokeless tobacco: Never Used  Substance and Sexual Activity  . Alcohol use: Yes    Comment: rarely  . Drug use: No  . Sexual activity: Yes    Birth control/protection: None  Lifestyle  . Physical activity:    Days per week: Not on file    Minutes per session: Not on file  . Stress: Not on file  Relationships  . Social connections:    Talks on phone: Not on file    Gets together: Not on file    Attends religious service: Not on file    Active member of club or organization: Not on file    Attends meetings of clubs or organizations: Not on file    Relationship status: Not on file  . Intimate partner violence:    Fear of current or ex partner: Not on file    Emotionally abused: Not on file    Physically abused: Not on file    Forced sexual activity: Not on file  Other Topics Concern  . Not on file  Social History Narrative   Lives with wife in a one story home.  Has 2 children.  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  Education: 11th grade.     FAMILY HISTORY:   Family Status  Relation Name Status  . Mother  Deceased  . Sister  Alive  . Father  Deceased  . MGM   Deceased  . MGF  Deceased  . PGM  Deceased  . PGF  Deceased  . Son  Deceased  . Daughter  Alive  . Son  [employer]  ROS:  Review of Systems  Constitutional: Negative.   HENT: Negative.   Eyes: Negative.   Respiratory: Negative.   Cardiovascular: Negative.   Gastrointestinal: Negative.   Genitourinary: Negative.   Musculoskeletal: Positive for neck pain.  Skin: Negative.   Endo/Heme/Allergies: Negative.   Psychiatric/Behavioral: The patient is nervous/anxious.     PHYSICAL EXAMINATION:    VITALS:   Vitals:   11/01/17 0942  BP: 140/80  Pulse: 77  SpO2: 99%  Weight: (!) 313 lb (142 kg)  Height: 5\' 11"  (1.803 m)    GEN:  The patient appears stated age and is in NAD. HEENT:  Normocephalic, atraumatic.  The mucous membranes are moist. The superficial temporal arteries are without ropiness or tenderness. CV:  RRR Lungs:  CTAB Neck/HEME:  There are no carotid bruits bilaterally.  Neurological examination:  Orientation: The patient is alert and oriented x3. Fund of knowledge is appropriate.  Recent and remote memory are intact.  Attention and concentration are normal.    Able to name objects and repeat phrases. Cranial nerves: There is good facial symmetry. Pupils are equal round and reactive to light bilaterally. Fundoscopic exam reveals clear margins bilaterally. Extraocular muscles are intact. The visual fields are full to confrontational testing. The speech is fluent and clear. Soft palate rises symmetrically and there is no tongue deviation. Hearing is intact to conversational tone. Sensation: Sensation is intact to light and pinprick throughout (facial, trunk, extremities). Vibration is intact at the bilateral big toe. There is no extinction with double simultaneous stimulation. There is no sensory dermatomal level identified. Motor: Strength is 5/5 in the bilateral upper and lower extremities.   Shoulder shrug is equal and symmetric.  There is no pronator drift. Deep tendon  reflexes: Deep tendon reflexes are 2/4 at the bilateral biceps, triceps, brachioradialis, patella and achilles. Plantar responses are downgoing bilaterally.  Movement examination: Tone: There is normal tone in the bilateral upper extremities.  The tone in the lower extremities is normal.  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  Rare movement of the R arm. Coordination:  There is no decremation with RAM's, with any form of RAMS, including alternating supination and pronation of the forearm, hand opening and closing, finger taps, heel taps and toe taps. Gait and Station: The patient has no difficulty arising out of a deep-seated chair without the use of the hands. The patient's stride length is normal.    Labs: Labs are reviewed.  On September 05, 2017, sodium was 140, potassium 4.0, chloride 106, CO2 22, BUN 21 and creatinine 1.47 (this is actually improved from the prior year at which point it was 1.92).  AST 34, ALT 33, alkaline phosphatase 81.  White blood cells were 4.2, hemoglobin 12.9, hematocrit 38.6 and platelets 243.  Lab Results  Component Value Date   TSH 2.190 03/05/2016     ASSESSMENT/PLAN:  1.  Meige syndrome with associated craniocervical dystonia  -We discussed the diagnosis as well as pathophysiology of the disease.  We discussed treatment options as well as prognostic indicators.  Patient education was provided.  -discussed that this is not curable and treatments are often not as good as we would like.  Botox can be helpful but his primary issue is jaw opening dystonia and while I often botox for jaw closing dystonia, jaw opening dystonia requires admin of botox to the lateral pterygoids from intraorally and I generally don't do these.  I do think that injection of platysma, zygomaticus  and nasalis could help but the biggest issue for him is the open jaw.    -will do labs today including ceruloplasmin, copper, PTH, ferritin,  sedimentation rate, ANA, antiphospholipid antibody, lupus anticoagulant, RPR, B12, antigliadin antibody, SSA, SSB  -MRI cervical spine to make sure that not missing anything given arm involvement (segmental dystonia)  -stop ativan  -start klonopin - 0.5 mg and work to bid dosing.  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  -in 2 weeks, if klonopin not helpful enough, start ingrezza.  QT done on 8/2 and was 395 and QTc was 414.  Will start 40 mg for a week and then increase to 80mg .  I have had this be helpful with Meige but insurance already denied his.  Samples given.  Risks, benefits, side effects and alternative therapies were discussed.  The opportunity to ask questions was given and they were answered to the best of my ability.  The patient expressed understanding and willingness to follow the outlined treatment protocols.  -discussed that GPi DBS can help but is generally not done for Meige  -discussed sensory tricks that can be safely used to help (straw between teeth, etc)  2.  F/u in 3-4 months.  Much greater than 50% of this visit was spent in counseling and coordinating care.  Total face to face time:  60 min   Cc:  Karle Plumber, MD

## 2017-11-01 ENCOUNTER — Other Ambulatory Visit (INDEPENDENT_AMBULATORY_CARE_PROVIDER_SITE_OTHER): Payer: Medicare HMO

## 2017-11-01 ENCOUNTER — Telehealth: Payer: Self-pay | Admitting: *Deleted

## 2017-11-01 ENCOUNTER — Encounter

## 2017-11-01 ENCOUNTER — Ambulatory Visit: Payer: Medicare HMO | Admitting: Neurology

## 2017-11-01 ENCOUNTER — Encounter: Payer: Self-pay | Admitting: Neurology

## 2017-11-01 VITALS — BP 140/80 | HR 77 | Ht 71.0 in | Wt 313.0 lb

## 2017-11-01 DIAGNOSIS — G243 Spasmodic torticollis: Secondary | ICD-10-CM

## 2017-11-01 DIAGNOSIS — G249 Dystonia, unspecified: Secondary | ICD-10-CM

## 2017-11-01 DIAGNOSIS — M5417 Radiculopathy, lumbosacral region: Secondary | ICD-10-CM | POA: Diagnosis not present

## 2017-11-01 DIAGNOSIS — M542 Cervicalgia: Secondary | ICD-10-CM | POA: Diagnosis not present

## 2017-11-01 DIAGNOSIS — G244 Idiopathic orofacial dystonia: Secondary | ICD-10-CM

## 2017-11-01 LAB — FERRITIN: Ferritin: 89.4 ng/mL (ref 22.0–322.0)

## 2017-11-01 LAB — VITAMIN B12: Vitamin B-12: 389 pg/mL (ref 211–911)

## 2017-11-01 LAB — SEDIMENTATION RATE: Sed Rate: 125 mm/hr — ABNORMAL HIGH (ref 0–20)

## 2017-11-01 MED ORDER — CLONAZEPAM 0.5 MG PO TABS: 0.5000 mg | ORAL_TABLET | Freq: Two times a day (BID) | ORAL | 3 refills | Status: DC | PRN

## 2017-11-01 NOTE — Telephone Encounter (Signed)
-----   Message from Octaviano Batty Tat, DO sent at 11/01/2017 10:59 AM EDT ----- Please fax copy of note to Dr. Charmayne Sheer ([place])

## 2017-11-01 NOTE — Progress Notes (Signed)
Note faxed.

## 2017-11-01 NOTE — Telephone Encounter (Signed)
Noted faxed

## 2017-11-01 NOTE — Patient Instructions (Addendum)
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    Stop lorazepam (ativan)  Start klonopin - 0.5 mg at night for 1 week and then start 1/2 tablet in the AM and 1 at night and IF you are not too sleepy, you will increase to 1 tablet twice per day  I gave you ingrezza samples.  Don't start them for 2 weeks, until you see if the klonopin helps.  If you do start it, start 40 mg samples for a week and then increase to 80 mg once per day  You will have labs today

## 2017-11-02 ENCOUNTER — Telehealth: Payer: Self-pay | Admitting: Neurology

## 2017-11-02 NOTE — Telephone Encounter (Signed)
Patient made aware. Labs sent to PCP.  He will come by to sign [employer] forms on Thursday.

## 2017-11-02 NOTE — Telephone Encounter (Signed)
-----  Message from Rebecca S Tat, DO sent at 11/02/2017  7:39 AM EDT ----- Let pt know that ESR is very elevated (is a nonspecific measure of inflammation).  Add [facility] paraneoplastic panel.  If that is neg, will need to consider LP.  Please fax copy of labs to PCP as well. 

## 2017-11-04 LAB — CERULOPLASMIN: Ceruloplasmin: 30 mg/dL (ref 18–36)

## 2017-11-04 LAB — TIQ-NTM

## 2017-11-04 LAB — SJOGREN'S SYNDROME ANTIBODS(SSA + SSB)
SSA (Ro) (ENA) Antibody, IgG: <1 AI
SSB (La) (ENA) Antibody, IgG: <1 AI

## 2017-11-04 LAB — ANTIPHOSPHOLIPID SYNDROME DIAGNOSTIC PANEL
Anticardiolipin IgA: <11 [APL'U] (ref <=11)
Anticardiolipin IgG: <14 [GPL'U] (ref <=14)
Beta-2 Glyco 1 IgM: <9 SMU (ref <=20)
Beta-2 Glyco I IgG: <9 SGU (ref <=20)
PTT-LA SCREEN: 36 s (ref <=40)
dRVVT: 44 s (ref <=45)

## 2017-11-04 LAB — COPPER, SERUM: Copper: 96 ug/dL (ref 70–175)

## 2017-11-04 LAB — RETICULIN ANTIBODIES, IGA W TITER: RETICULIN IGA SCREEN: NEGATIVE

## 2017-11-04 LAB — RPR: RPR: NONREACTIVE

## 2017-11-04 LAB — PARATHYROID HORMONE, INTACT (NO CA): PTH: 70 pg/mL — AB (ref 14–64)

## 2017-11-04 LAB — ANA: ANA: NEGATIVE

## 2017-11-07 ENCOUNTER — Other Ambulatory Visit: Payer: Self-pay

## 2017-11-07 ENCOUNTER — Emergency Department (HOSPITAL_BASED_OUTPATIENT_CLINIC_OR_DEPARTMENT_OTHER)
Admission: EM | Admit: 2017-11-07 | Discharge: 2017-11-07 | Disposition: A | Discharge status: Home / Self Care | Payer: Medicare HMO | Attending: Emergency Medicine | Admitting: Emergency Medicine

## 2017-11-07 ENCOUNTER — Encounter (HOSPITAL_BASED_OUTPATIENT_CLINIC_OR_DEPARTMENT_OTHER): Payer: Self-pay | Admitting: Emergency Medicine

## 2017-11-07 DIAGNOSIS — M79671 Pain in right foot: Secondary | ICD-10-CM | POA: Insufficient documentation

## 2017-11-07 DIAGNOSIS — Z87891 Personal history of nicotine dependence: Secondary | ICD-10-CM | POA: Insufficient documentation

## 2017-11-07 DIAGNOSIS — I110 Hypertensive heart disease with heart failure: Secondary | ICD-10-CM | POA: Diagnosis not present

## 2017-11-07 DIAGNOSIS — E119 Type 2 diabetes mellitus without complications: Secondary | ICD-10-CM | POA: Diagnosis not present

## 2017-11-07 DIAGNOSIS — I5022 Chronic systolic (congestive) heart failure: Secondary | ICD-10-CM | POA: Diagnosis not present

## 2017-11-07 DIAGNOSIS — Z7984 Long term (current) use of oral hypoglycemic drugs: Secondary | ICD-10-CM | POA: Diagnosis not present

## 2017-11-07 DIAGNOSIS — Z79899 Other long term (current) drug therapy: Secondary | ICD-10-CM | POA: Diagnosis not present

## 2017-11-07 MED ORDER — PREDNISONE 50 MG PO TABS
60.0000 mg | ORAL_TABLET | Freq: Once | ORAL | Status: AC
  Administered 2017-11-07: 60 mg via ORAL
  Filled 2017-11-07: qty 1

## 2017-11-07 MED ORDER — HYDROCODONE-ACETAMINOPHEN 5-325 MG PO TABS: 1.0000 | ORAL_TABLET | Freq: Four times a day (QID) | ORAL | 0 refills | Status: DC | PRN

## 2017-11-07 MED ORDER — HYDROMORPHONE HCL 1 MG/ML IJ SOLN
1.0000 mg | Freq: Once | INTRAMUSCULAR | Status: AC
  Administered 2017-11-07: 1 mg via INTRAMUSCULAR
  Filled 2017-11-07: qty 1

## 2017-11-07 MED ORDER — PREDNISONE 50 MG PO TABS: ORAL_TABLET | ORAL | 0 refills | Status: DC

## 2017-11-07 NOTE — ED Provider Notes (Signed)
[facility] EMERGENCY DEPARTMENT Provider Note   CSN: [id number] Arrival date & time: 11/07/17  0009     History   Chief Complaint Chief Complaint  Patient presents with  . Foot Pain    HPI Isaac Valdez is a 57 y.o. male.  The history is provided by the patient.  Foot Pain  This is a new problem. The current episode started 2 days ago. The problem occurs constantly. The problem has been gradually worsening. Pertinent negatives include no chest pain and no shortness of breath. The symptoms are aggravated by walking. The symptoms are relieved by rest.  Patient reports increasing right foot pain.  He reports over 2 days ago been having pain in his right great toe, that the pain is present throughout his foot.  Denies any trauma.  Hurts to walk.  Improved with rest.  No redness or swelling.  Long history of gout, this feels similar to prior.  He ran out of his colchicine, has been using allopurinol without improvement Past Medical History:  Diagnosis Date  . Back pain   . CHF (congestive heart failure) (HCC)   . Diabetes mellitus   . Dyspnea   . Gout   . High cholesterol   . Hypertension     Patient Active Problem List   Diagnosis Date Noted  . AKI (acute kidney injury) (HCC) 03/19/2017  . GERD (gastroesophageal reflux disease) 03/19/2017  . 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 Gout 03/19/2017  . SOB (shortness of breath) 03/19/2017  . Hyperkalemia 03/19/2017  . Leukocytosis 03/19/2017  . Acute renal failure (HCC)   . Moderate to severe mitral regurgitation 10/06/2016  . Hypertensive urgency 03/05/2016  . Chest pain 03/05/2016  . Chronic systolic heart failure (HCC) 03/05/2016  . Hypertension   . High cholesterol   . Typical atrial flutter (HCC)   . Metabolic syndrome   . Atrial flutter (HCC) 03/04/2016    Past Surgical History:  Procedure Laterality Date  . A-FLUTTER ABLATION N/A 04/21/2017   Procedure: A-FLUTTER  ABLATION;  Surgeon: Regan Lemming, MD;  Location: [facility];  Service: Cardiovascular;  Laterality: N/A;  . BACK SURGERY    . MANDIBLE FRACTURE SURGERY          Home Medications    Prior to Admission medications   Medication Sig Start Date End Date Taking? Authorizing Provider  allopurinol (ZYLOPRIM) 300 MG tablet Take 300 mg by mouth daily as needed (for gout flare ups).    [provider]  atorvastatin (LIPITOR) 10 MG tablet Take by mouth.    [provider]  carvedilol (COREG) 6.25 MG tablet Take 1 tablet (6.25 mg total) by mouth 2 (two) times daily. 06/21/17   Camnitz, Andree Coss, MD  clonazePAM (KLONOPIN) 0.5 MG tablet Take 1 tablet (0.5 mg total) by mouth 2 (two) times daily as needed for anxiety. 11/01/17   Tat, Octaviano Batty, DO  cloNIDine (CATAPRES) 0.2 MG tablet Take 0.2 mg by mouth as needed (elevated b/p).    [provider]  glipiZIDE (GLUCOTROL) 5 MG tablet Take 5 mg by mouth daily as needed (CBG >120).  08/12/16   [provider]  HYDROcodone-acetaminophen (NORCO/VICODIN) 5-325 MG tablet Take 1 tablet by mouth every 6 (six) hours as needed for severe pain. 11/07/17   Zadie Rhine, MD  LORazepam (ATIVAN) 1 MG tablet Take 1 tablet (1 mg total) by mouth every 8 (eight) hours as needed for anxiety. 10/08/17  Couture, Cortni S, PA-C  predniSONE (DELTASONE) 50 MG tablet 1 tablet PO QD X4 days 11/07/17   Zadie Rhine, MD  spironolactone (ALDACTONE) 25 MG tablet Take 25 mg by mouth 2 (two) times daily.    [provider]  torsemide (DEMADEX) 20 MG tablet Take 20 mg by mouth at bedtime.    [provider]    Family History Family History  Problem Relation Age of Onset  . Heart disease Mother   . Hypertension Sister   . Other Father        hypothermia  . Heart failure Son     Social History Social History   Tobacco Use  . Smoking status: Former Smoker    Packs/day: 0.50  . Smokeless tobacco: Never Used    Substance Use Topics  . Alcohol use: Yes    Comment: rarely  . Drug use: No     Allergies   Aspirin   Review of Systems Review of Systems  Constitutional: Negative for fever.  Respiratory: Negative for shortness of breath.   Cardiovascular: Negative for chest pain.  Gastrointestinal: Negative for vomiting.  Musculoskeletal: Positive for arthralgias. Negative for joint swelling.  Skin: Negative for wound.  All other systems reviewed and are negative.    Physical Exam Updated Vital Signs BP (!) 151/83 (BP Location: Right Arm)   Pulse 72   Temp 97.6 F (36.4 C) (Oral)   Resp 20   Ht 1.803 m (5\' 11" )   Wt 136.1 kg   SpO2 100%   BMI 41.84 kg/m   Physical Exam CONSTITUTIONAL: Well developed/well nourished, uncomfortable appearing HEAD: Normocephalic/atraumatic EYES: EOMI ENMT: Mucous membranes moist NECK: supple no meningeal signs CV: S1/S2 noted, no murmurs/rubs/gallops noted LUNGS: Lungs are clear to auscultation bilaterally, no apparent distress ABDOMEN: soft, nontender, obese NEURO: Pt is awake/alert/appropriate, moves all extremitiesx4.  No facial droop.   EXTREMITIES: pulses normal/equal in both feet, full ROM Diffuse tenderness to right foot, no erythema, no bruising, no crepitus.  No puncture wounds noted to plantar surface of foot.  No wounds noted to webspaces of foot.  He is able move his toes his right foot. SKIN: warm, color normal skin tags to face PSYCH: no abnormalities of mood noted, alert and oriented to situation   ED Treatments / Results  Labs (all labs ordered are listed, but only abnormal results are displayed) Labs Reviewed - No data to display  EKG None  Radiology No results found.  Procedures Procedures  Narcotic database reviewed and considered in decision making Medications Ordered in ED Medications  HYDROmorphone (DILAUDID) injection 1 mg (has no administration in time range)  predniSONE (DELTASONE) tablet 60 mg (has no  administration in time range)     Initial Impression / Assessment and Plan / ED Course  I have reviewed the triage vital signs and the nursing notes.   Pt with long history of gout presents with right toe and foot pain.  No signs of infectious etiologies, no signs of trauma.  Will start with prednisone burst, and pain medications.  Referred to PCP.  I advised him to monitor his blood glucose while using prednisone  Final Clinical Impressions(s) / ED Diagnoses   Final diagnoses:  Foot pain, right    ED Discharge Orders         Ordered    predniSONE (DELTASONE) 50 MG tablet     11/07/17 0248    HYDROcodone-acetaminophen (NORCO/VICODIN) 5-325 MG tablet  Every 6 hours PRN  11/07/17 4098           Zadie Rhine, MD 11/07/17 [phone]

## 2017-11-07 NOTE — ED Triage Notes (Signed)
Pt c/o 10/10 right foot pain for the past few days getting worse. Pt states he has a Hx of gout, he is taking his medication for it, but no relief.

## 2017-11-12 ENCOUNTER — Telehealth: Payer: Self-pay | Admitting: Neurology

## 2017-11-12 NOTE — Telephone Encounter (Signed)
Patient Isaac Valdez needing to speak with you  ASAP. He did not say why on the voicemail. Please Call. Thanks

## 2017-11-12 NOTE — Telephone Encounter (Signed)
Reminded patient about lab forms. He states he will come Monday morning.

## 2017-11-12 NOTE — Telephone Encounter (Signed)
Spoke with patient. He states Clonazepam did not help.  Per message from Dr. Arbutus Leas:  Silvio Pate, CMA sent to Silvio Pate, CMA        Tat, Octaviano Batty, DO sent to Silvio Pate, CMA      Call him in 2 weeks and see if klonopin helped. If not, start ingrezza via samples I gave him. I also need more samples of ingrezza    He will go ahead and start Ingrezza samples. He will let us know in a couple weeks how he is doing and if doing well we will get him more samples and get him to sign paperwork to sent to company for medication. He is agreeable with this plan.   Dr. Arbutus Leas Lorain Childes.

## 2017-11-12 NOTE — Telephone Encounter (Signed)
Did he get athena labs drawn?

## 2017-11-12 NOTE — Telephone Encounter (Signed)
He has not come by to sign the forms.

## 2017-11-12 NOTE — Telephone Encounter (Signed)
Please tell him that this is very important and needs to do that Monday (or today)

## 2017-11-29 ENCOUNTER — Ambulatory Visit
Admission: RE | Admit: 2017-11-29 | Discharge: 2017-11-29 | Disposition: A | Discharge status: Home / Self Care | Payer: Medicare HMO | Source: Ambulatory Visit | Attending: Neurology | Admitting: Neurology

## 2017-11-29 DIAGNOSIS — M542 Cervicalgia: Secondary | ICD-10-CM

## 2017-11-29 DIAGNOSIS — M5417 Radiculopathy, lumbosacral region: Secondary | ICD-10-CM

## 2017-11-29 DIAGNOSIS — G249 Dystonia, unspecified: Secondary | ICD-10-CM

## 2017-12-02 ENCOUNTER — Telehealth: Payer: Self-pay | Admitting: Neurology

## 2017-12-02 NOTE — Telephone Encounter (Signed)
Spoke with Isaac Valdez and made her aware.

## 2017-12-02 NOTE — Telephone Encounter (Signed)
Martie Lee from [facility] is calling in on this patient wanting to ask a couple of questions regarding previous appt. Please call her back at [phone]. Thanks!

## 2017-12-02 NOTE — Telephone Encounter (Signed)
Martie Lee called back and left a vm. She states Dr. Maple Hudson just wanted to know if we would be performing Botox on patient, or whether the patient would be following up with their office. It looks like via Dr. Don Perking office note she does not perform the type of Botox that he is requesting. Dr. Arbutus Leas please confirm.

## 2017-12-02 NOTE — Telephone Encounter (Signed)
Pt needs a much bigger work up before any botox can be decided.  His sed rate is over a 100 and we need to figure out etiology.  If athena testing neg, needs LP.  If LP neg, will need work up by PCP or oncology to make sure not missing anything.  I will likely repeat it when here next week

## 2017-12-02 NOTE — Telephone Encounter (Signed)
LM for Sabrina to call back.

## 2017-12-02 NOTE — Progress Notes (Deleted)
Isaac Valdez was seen today in the movement disorders clinic for neurologic consultation at the request of Arvind, Idelia Salm, MD.  The consultation is for the evaluation of dyskinesia/abnormal movements in the face.  The records that were made available to me were reviewed.  Patient reports that symptoms started about 1 year ago.  He states that it seemed to start in the R arm but that is gone.  It is mostly in the R neck and R jaw.  The mouth and jaw are definitely the biggest issue.  He is not having trouble swallowing.  He notes that if he drinks 4-5 beers it will get better.  He states that the tongue is always moving and he cannot control it.  His mouth muscles are pulling open.  When he sleeps he uses bipap and he feels better but he will wake up biting the tongue.  He has started wearing a mouth piece at night.  That helps some.  He does describe some sensory tricks (put toothpick in between teeth but ended up having a "gasp" and it went into back of throat).    Pt does have a consult with Dr. Rubin Payor in 07/2018.  He was seen at [facility] ED on 08/02/17 by a neurology resident for dyskinesia.  It does not appear that he followed up there, with the exception of the emergency room.  The patient has tried amantadine without success.  He tried it for 2-3 weeks, 3 times per day.  They tried to get approval for Ingrezza, but given the diagnosis, was not approved. He tried xenazine but was only able to take it for 2 weeks and he had to d/c because of cost.  He tried valium without success, but doesn't know the appt.  He is now taking ativan prn; he last used it 3 days ago because he is feels panic attacks because of the movements.  No exposure to reglan or antipsychotics.     Neuroimaging of the brain has  previously been performed.  It is not available for my review today.  Reports are available, but no images.  Patient had an MRI of the brain without gadolinium on July 04, 2017 through [facility].  This was  reported to be normal.  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  Started on clonazepam last visit.  He reports that ***.  Patient was also given samples of Ingrezza and states ***.  He had lab work since last visit.  His sedimentation rate came back very high at 125.  After that, it was felt a larger work-up, especially for paraneoplastic states that was warranted.  The patient was called several times to find the [facility] form for paraneoplastic testing. ***  PREVIOUS MEDICATIONS: amantadine, benadryl, xenazine for only 2 weeks because of cost, valium, ativan; insurance denied ingrezza  ALLERGIES:   Allergies  Allergen Reactions  . Aspirin Other (See Comments)    "indigestion"    CURRENT MEDICATIONS:  Outpatient Encounter Medications as of 12/06/2017  Medication Sig  . allopurinol (ZYLOPRIM) 300 MG tablet Take 300 mg by mouth daily as needed (for gout flare ups).  Marland Kitchen atorvastatin (LIPITOR) 10 MG tablet Take by mouth.  . carvedilol (COREG) 6.25 MG tablet Take 1 tablet (6.25 mg total) by mouth 2 (two) times daily.  . clonazePAM (KLONOPIN) 0.5 MG tablet Take 1 tablet (0.5 mg total) by mouth 2 (two) times daily as needed for anxiety.  . cloNIDine (CATAPRES) 0.2 MG tablet Take  0.2 mg by mouth as needed (elevated b/p).  Marland Kitchen glipiZIDE (GLUCOTROL) 5 MG tablet Take 5 mg by mouth daily as needed (CBG >120).   Marland Kitchen HYDROcodone-acetaminophen (NORCO/VICODIN) 5-325 MG tablet Take 1 tablet by mouth every 6 (six) hours as needed for severe pain.  Marland Kitchen LORazepam (ATIVAN) 1 MG tablet Take 1 tablet (1 mg total) by mouth every 8 (eight) hours as needed for anxiety.  . predniSONE (DELTASONE) 50 MG tablet 1 tablet PO QD X4 days  . spironolactone (ALDACTONE) 25 MG tablet Take 25 mg by mouth 2 (two) times daily.  Marland Kitchen torsemide (DEMADEX) 20 MG tablet Take 20 mg by mouth at bedtime.   No facility-administered encounter medications on file as of 12/06/2017.     PAST MEDICAL HISTORY:   Past Medical  History:  Diagnosis Date  . Back pain   . CHF (congestive heart failure) (HCC)   . Diabetes mellitus   . Dyspnea   . Gout   . High cholesterol   . Hypertension     PAST SURGICAL HISTORY:   Past Surgical History:  Procedure Laterality Date  . A-FLUTTER ABLATION N/A 04/21/2017   Procedure: A-FLUTTER ABLATION;  Surgeon: Regan Lemming, MD;  Location: [facility];  Service: Cardiovascular;  Laterality: N/A;  . BACK SURGERY    . MANDIBLE FRACTURE SURGERY      SOCIAL HISTORY:   Social History   Socioeconomic History  . Marital status: Married    Spouse name: Not on file  . Number of children: 2  . Years of education: 76  . Highest education level: Not on file  Occupational History  . Occupation: part time truck Hospital doctor  . Occupation: on disability  Social Needs  . Financial resource strain: Not on file  . Food insecurity:    Worry: Not on file    Inability: Not on file  . Transportation needs:    Medical: Not on file    Non-medical: Not on file  Tobacco Use  . Smoking status: Former Smoker    Packs/day: 0.50  . Smokeless tobacco: Never Used  Substance and Sexual Activity  . Alcohol use: Yes    Comment: rarely  . Drug use: No  . Sexual activity: Yes    Birth control/protection: None  Lifestyle  . Physical activity:    Days per week: Not on file    Minutes per session: Not on file  . Stress: Not on file  Relationships  . Social connections:    Talks on phone: Not on file    Gets together: Not on file    Attends religious service: Not on file    Active member of club or organization: Not on file    Attends meetings of clubs or organizations: Not on file    Relationship status: Not on file  . Intimate partner violence:    Fear of current or ex partner: Not on file    Emotionally abused: Not on file    Physically abused: Not on file    Forced sexual activity: Not on file  Other Topics Concern  . Not on file  Social History Narrative   Lives with  wife in a one story home.  Has 2 children.  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  Education: 11th grade.     FAMILY HISTORY:   Family Status  Relation Name Status  . Mother  Deceased  . Sister  Alive  . Father  Deceased  hypothermia  . MGM  Deceased  . MGF  Deceased  . PGM  Deceased  . PGF  Deceased  . Son  Deceased  . Daughter  Alive  . Son  Alive    ROS:  ROS  PHYSICAL EXAMINATION:    VITALS:   There were no vitals filed for this visit.  GEN:  The patient appears stated age and is in NAD. HEENT:  Normocephalic, atraumatic.  The mucous membranes are moist. The superficial temporal arteries are without ropiness or tenderness. CV:  RRR Lungs:  CTAB Neck/HEME:  There are no carotid bruits bilaterally.  Neurological examination:  Orientation: The patient is alert and oriented x3. Fund of knowledge is appropriate.  Recent and remote memory are intact.  Attention and concentration are normal.    Able to name objects and repeat phrases. Cranial nerves: There is good facial symmetry. Pupils are equal round and reactive to light bilaterally. Fundoscopic exam reveals clear margins bilaterally. Extraocular muscles are intact. The visual fields are full to confrontational testing. The speech is fluent and clear. Soft palate rises symmetrically and there is no tongue deviation. Hearing is intact to conversational tone. Sensation: Sensation is intact to light and pinprick throughout (facial, trunk, extremities). Vibration is intact at the bilateral big toe. There is no extinction with double simultaneous stimulation. There is no sensory dermatomal level identified. Motor: Strength is 5/5 in the bilateral upper and lower extremities.   Shoulder shrug is equal and symmetric.  There is no pronator drift. Deep tendon reflexes: Deep tendon reflexes are 2/4 at the bilateral biceps, triceps, brachioradialis, patella and achilles. Plantar responses are downgoing  bilaterally.  Movement examination: Tone: There is normal tone in the bilateral upper extremities.  The tone in the lower extremities is normal.  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  Rare movement of the R arm. Coordination:  There is no decremation with RAM's, with any form of RAMS, including alternating supination and pronation of the forearm, hand opening and closing, finger taps, heel taps and toe taps. Gait and Station: The patient has no difficulty arising out of a deep-seated chair without the use of the hands. The patient's stride length is normal.    Labs: Labs are reviewed.  On September 05, 2017, sodium was 140, potassium 4.0, chloride 106, CO2 22, BUN 21 and creatinine 1.47 (this is actually improved from the prior year at which point it was 1.92).  AST 34, ALT 33, alkaline phosphatase 81.  White blood cells were 4.2, hemoglobin 12.9, hematocrit 38.6 and platelets 243.  Lab Results  Component Value Date   TSH 2.190 03/05/2016   Lab Results  Component Value Date   ESRSEDRATE 125 Repeated and verified X2. (H) 11/01/2017   Lab Results  Component Value Date   VITAMINB12 389 11/01/2017   Lab Results  Component Value Date   ANA NEGATIVE 11/01/2017   Lab Results  Component Value Date   PTH 70 (H) 11/01/2017   CALCIUM 9.6 06/25/2017   CAION 1.08 (L) 10/08/2017   Lab Results  Component Value Date   FERRITIN 89.4 11/01/2017     ASSESSMENT/PLAN:  1.  Meige syndrome with associated craniocervical dystonia  -Given very high sed rate, patient needs a bigger work-up. ***Was given paper to have Athena testing done for paraneoplastic panel.  If that is negative, but I do think he needs a lumbar puncture.  -We discussed the diagnosis as well as pathophysiology of the disease.  We discussed treatment options as well as prognostic indicators.  Patient education was provided.  -discussed that this is not curable and treatments are often  not as good as we would like.  Botox can be helpful but his primary issue is jaw opening dystonia and while I often botox for jaw closing dystonia, jaw opening dystonia requires admin of botox to the lateral pterygoids from intraorally and I generally don't do these.  I do think that injection of platysma, zygomaticus and nasalis could help but the biggest issue for him is the open jaw.    -will do labs today including ceruloplasmin, copper, PTH, ferritin, sedimentation rate, ANA, antiphospholipid antibody, lupus anticoagulant, RPR, B12, antigliadin antibody, SSA, SSB  -MRI cervical spine to make sure that not missing anything given arm involvement (segmental dystonia)  -stop ativan  -start klonopin - 0.5 mg and work to bid dosing.  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  -in 2 weeks, if klonopin not helpful enough, start ingrezza.  QT done on 8/2 and was 395 and QTc was 414.  Will start 40 mg for a week and then increase to 80mg .  I have had this be helpful with Meige but insurance already denied his.  Samples given.  Risks, benefits, side effects and alternative therapies were discussed.  The opportunity to ask questions was given and they were answered to the best of my ability.  The patient expressed understanding and willingness to follow the outlined treatment protocols.  -discussed that GPi DBS can help but is generally not done for Meige  -discussed sensory tricks that can be safely used to help (straw between teeth, etc)  2.  F/u in 3-4 months.  Much greater than 50% of this visit was spent in counseling and coordinating care.  Total face to face time:  60 min   Cc:  Karle Plumber, MD

## 2017-12-03 ENCOUNTER — Telehealth: Payer: Self-pay | Admitting: Neurology

## 2017-12-03 NOTE — Telephone Encounter (Signed)
Left message on machine for patient to call back.  We do not have work in appts available for him. If he cancels he will need to be scheduled in the next available appt spot or keep appt for Monday.

## 2017-12-03 NOTE — Telephone Encounter (Signed)
Spoke with patient. He states he will be out of town, but really wants answers. He has stopped Ingrezza, it did not help movements. He was unable to hold still long enough for MR, so this was cancelled on Monday. I have rescheduled his appt for first available and placed him on a cancellation list. His Jake Samples testing is due to come back on 12/06/17.

## 2017-12-03 NOTE — Telephone Encounter (Signed)
Patient cancelled his appt for Monday and wants to be worked into an urgent spot for that following week. Is this possible? And he was needing to let yall know his sample medication was out. He needs more of the Ingresis medication. Please call him back at [phone]. Thanks!

## 2017-12-03 NOTE — Telephone Encounter (Signed)
MRI needs to be done with valium then and in an open.  Can put him in one of the October slots that I reserved for urgent patients.  Is he still taking klonopin?  If so, hold that for the MRI so we can give the valium.

## 2017-12-04 IMAGING — DX DG CHEST 2V
2 series · 2 of 2 positions shown · non-contrast
Comparison: 07/08/2015 CXR

CLINICAL DATA: Mid day chest pain x1 day.

EXAM:
CHEST  2 VIEW

[chest pa]
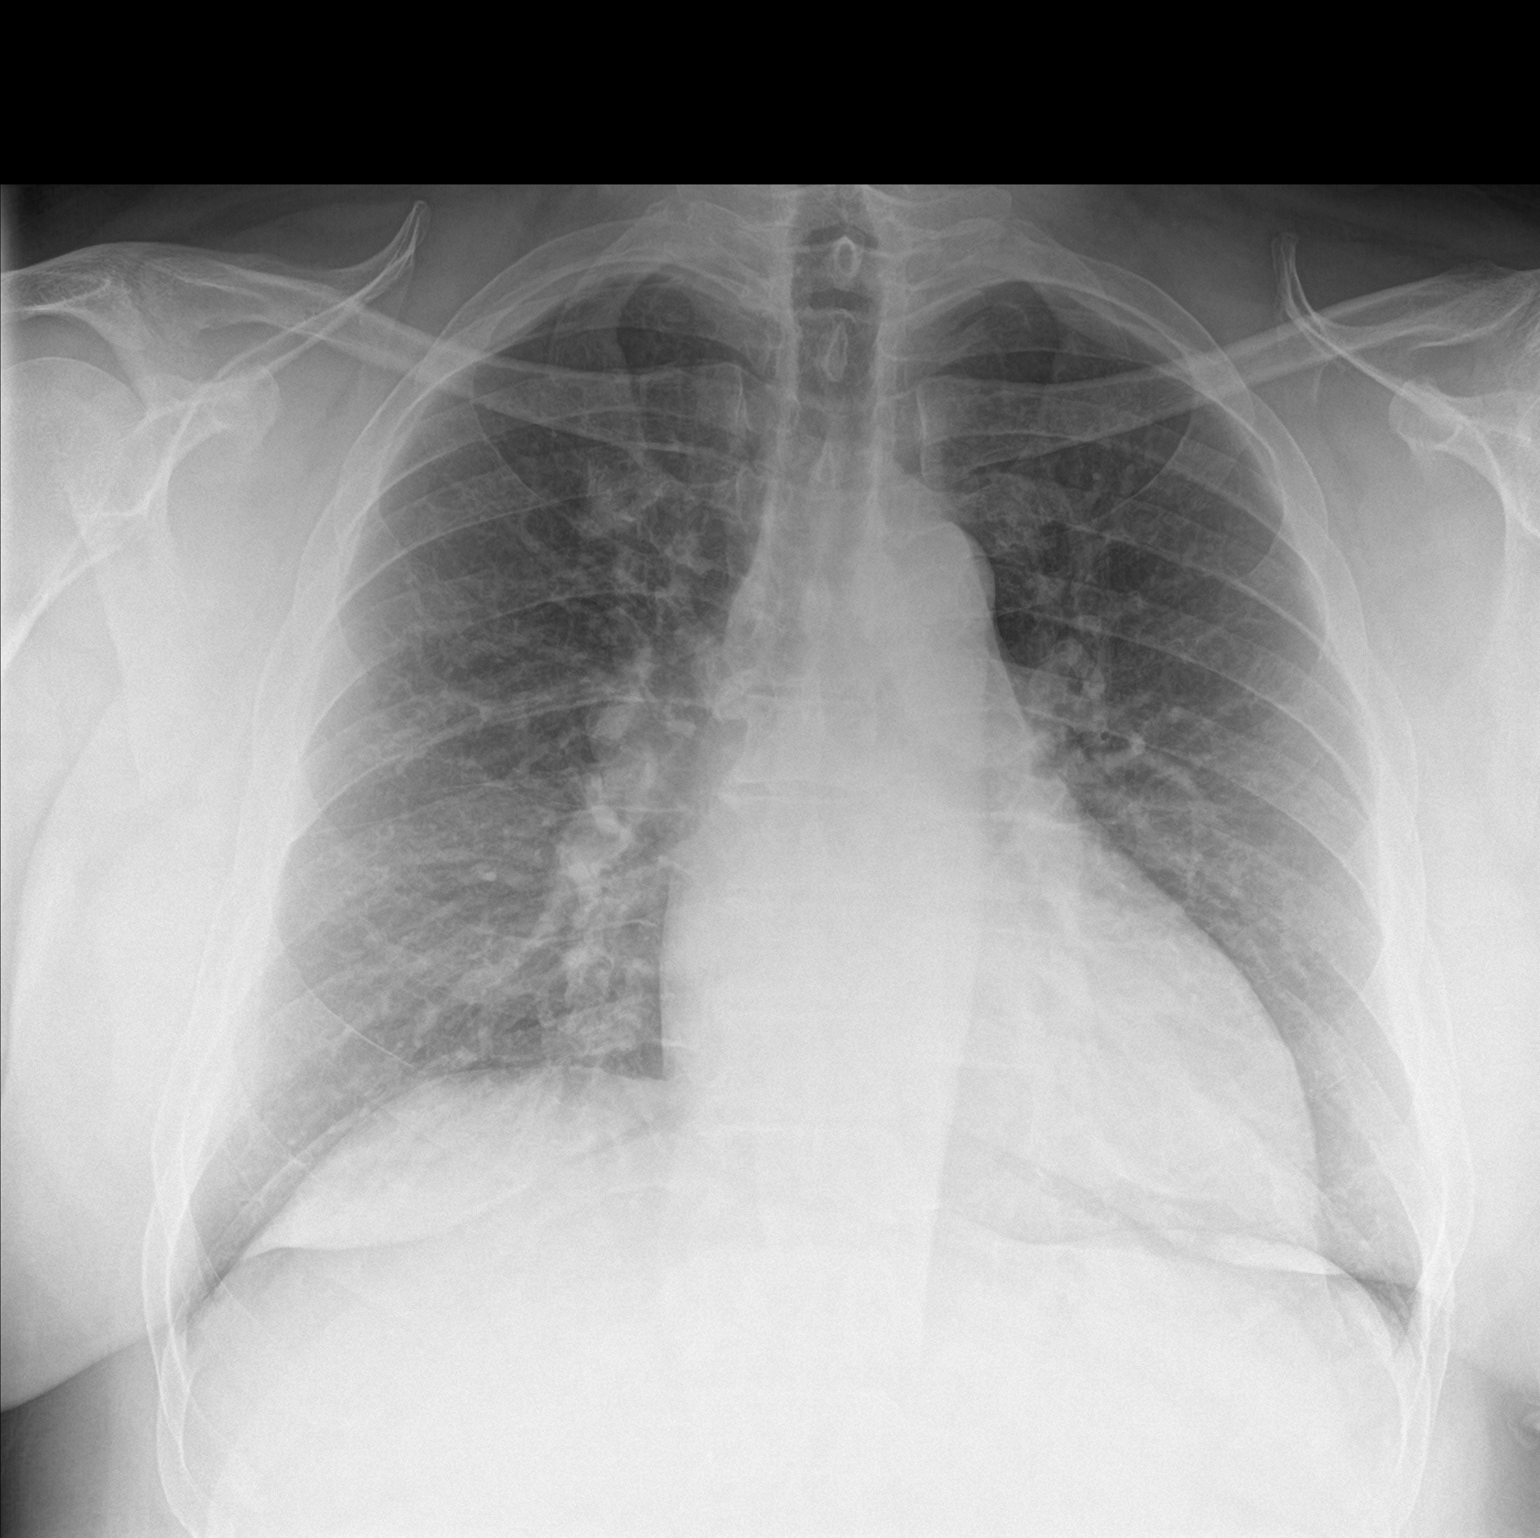

[chest lat]
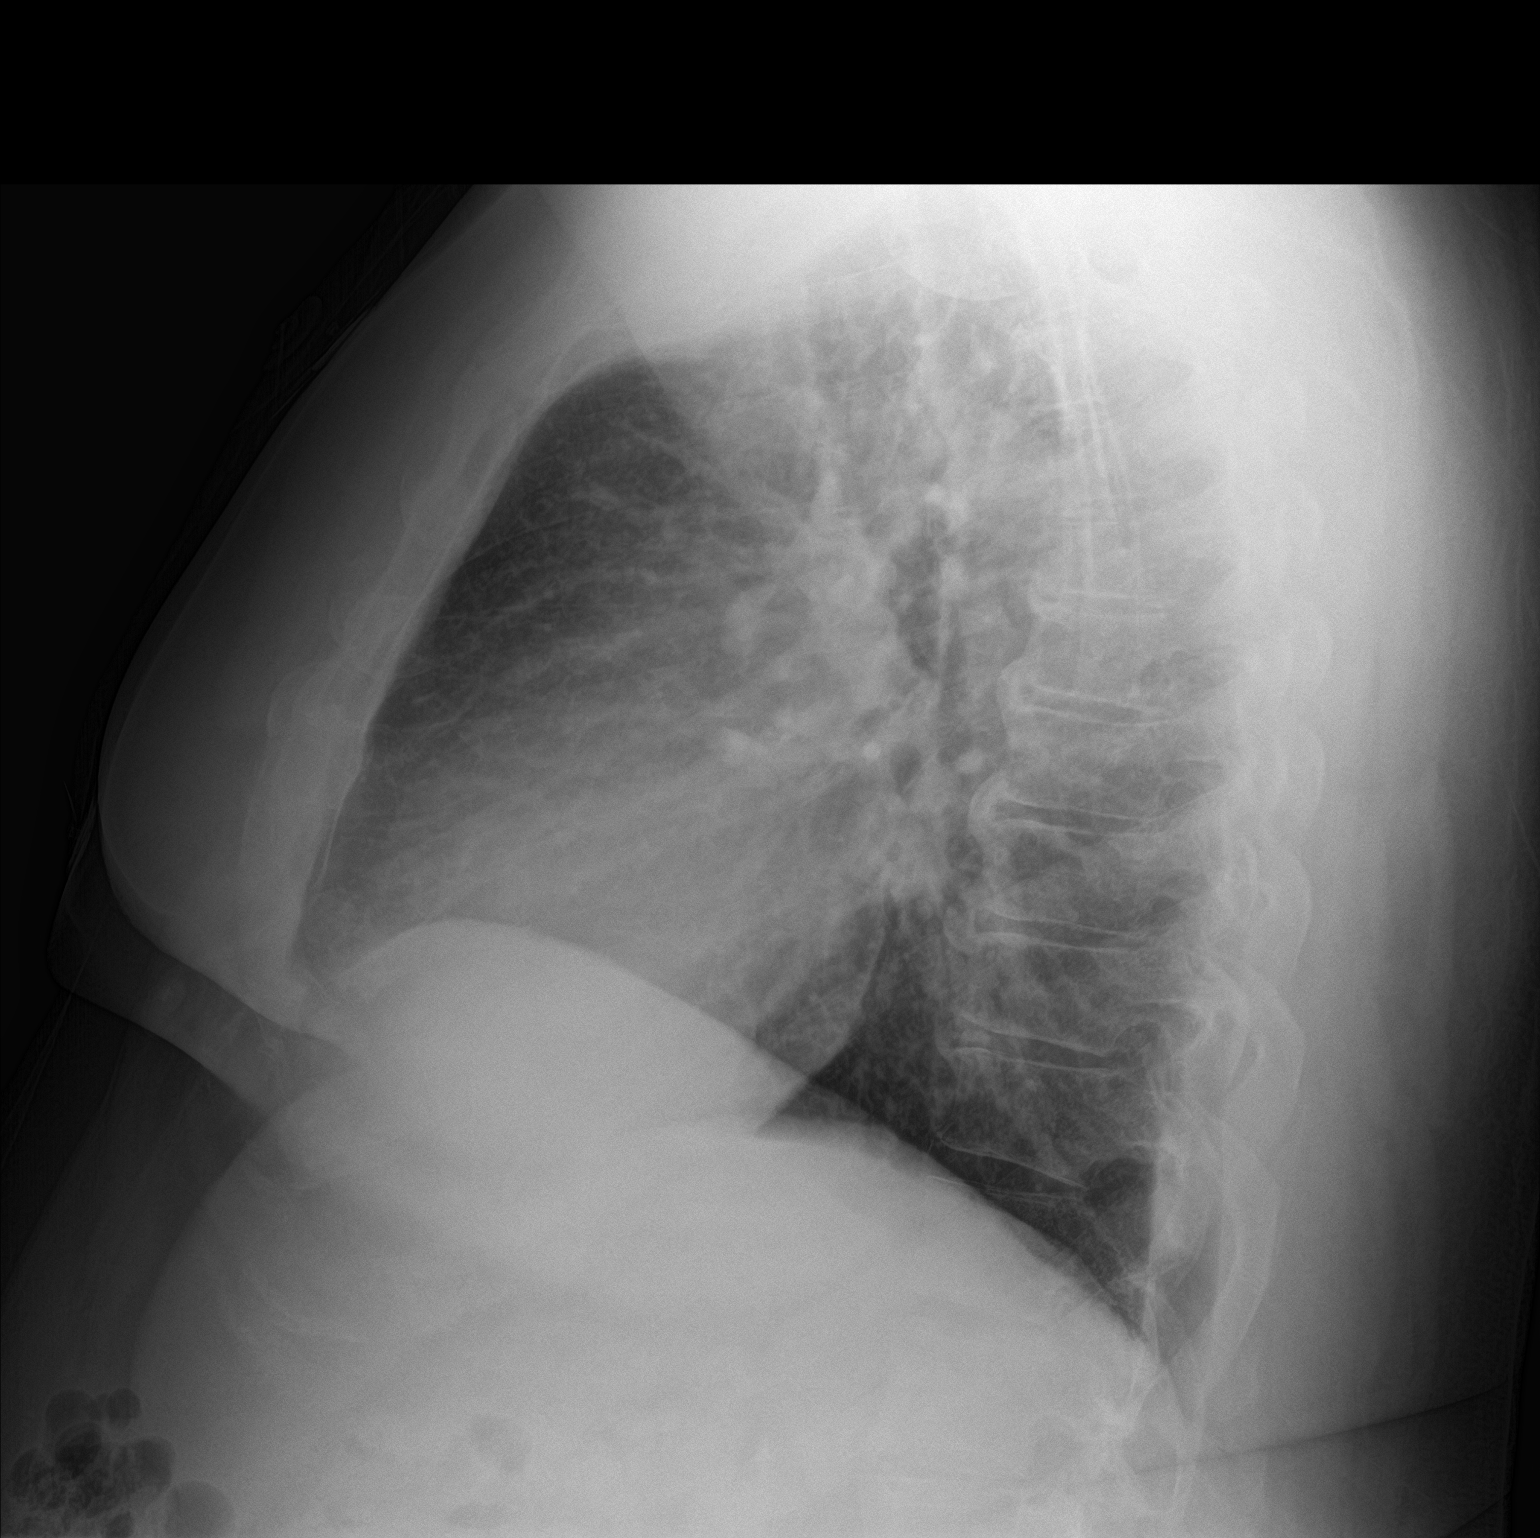

[2 of 2 positions shown; findings below may reference images not displayed]

FINDINGS: The heart size and mediastinal contours are within normal limits.
Both lungs are clear with some minimal atelectasis at each lung
base. The visualized skeletal structures are unremarkable.
IMPRESSION: No active cardiopulmonary disease.

## 2017-12-06 ENCOUNTER — Ambulatory Visit: Payer: Medicare HMO | Admitting: Neurology

## 2017-12-06 MED ORDER — DIAZEPAM 5 MG PO TABS: ORAL_TABLET | ORAL | 0 refills | Status: DC

## 2017-12-06 NOTE — Telephone Encounter (Signed)
Left message on machine for patient to call back.

## 2017-12-06 NOTE — Telephone Encounter (Signed)
Spoke with patient.  He did go back to taking Clonazepam. He knows to stop it for MRI. He would like to reschedule the MRI himself. Per Dr. Arbutus Leas can send in 3 valium.

## 2017-12-06 NOTE — Telephone Encounter (Signed)
RX called to pharmacy.

## 2017-12-08 ENCOUNTER — Telehealth: Payer: Self-pay | Admitting: Neurology

## 2017-12-08 DIAGNOSIS — M542 Cervicalgia: Secondary | ICD-10-CM

## 2017-12-08 NOTE — Telephone Encounter (Signed)
Spoke with patient and made him aware as discussed valium was sent to the pharmacy and a new order was sent to [facility].

## 2017-12-08 NOTE — Telephone Encounter (Signed)
New order placed for MR at [facility]. They should call him to schedule. Patient already aware I sent valium in per our last conversation.

## 2017-12-08 NOTE — Telephone Encounter (Signed)
Patient would like you to schedule him another MRI. He said his last one was unclear due to him not be able to be still due to his nerves. He also would need a Valium called in for the MRI. [facility] called as well needing a new order for the MRI. Thanks

## 2017-12-08 NOTE — Telephone Encounter (Signed)
Patient left a message on the VM needing to speak Jade. He states that he will have to redo his MRI of the neck. He will need something to help relax him while having the MRI. We will need to send a new order over for them to redo it. Also he wants to talk to her about his medication that was called in to the pharmacy

## 2017-12-11 ENCOUNTER — Emergency Department (HOSPITAL_BASED_OUTPATIENT_CLINIC_OR_DEPARTMENT_OTHER): Payer: Medicare HMO

## 2017-12-11 ENCOUNTER — Encounter (HOSPITAL_BASED_OUTPATIENT_CLINIC_OR_DEPARTMENT_OTHER): Payer: Self-pay | Admitting: Emergency Medicine

## 2017-12-11 ENCOUNTER — Other Ambulatory Visit: Payer: Self-pay

## 2017-12-11 ENCOUNTER — Emergency Department (HOSPITAL_BASED_OUTPATIENT_CLINIC_OR_DEPARTMENT_OTHER)
Admission: EM | Admit: 2017-12-11 | Discharge: 2017-12-11 | Disposition: A | Discharge status: Home / Self Care | Payer: Medicare HMO | Attending: Emergency Medicine | Admitting: Emergency Medicine

## 2017-12-11 DIAGNOSIS — Z87891 Personal history of nicotine dependence: Secondary | ICD-10-CM | POA: Diagnosis not present

## 2017-12-11 DIAGNOSIS — Z79899 Other long term (current) drug therapy: Secondary | ICD-10-CM | POA: Insufficient documentation

## 2017-12-11 DIAGNOSIS — Z7984 Long term (current) use of oral hypoglycemic drugs: Secondary | ICD-10-CM | POA: Insufficient documentation

## 2017-12-11 DIAGNOSIS — M7661 Achilles tendinitis, right leg: Secondary | ICD-10-CM | POA: Insufficient documentation

## 2017-12-11 DIAGNOSIS — I5022 Chronic systolic (congestive) heart failure: Secondary | ICD-10-CM | POA: Diagnosis not present

## 2017-12-11 DIAGNOSIS — E119 Type 2 diabetes mellitus without complications: Secondary | ICD-10-CM | POA: Insufficient documentation

## 2017-12-11 DIAGNOSIS — I110 Hypertensive heart disease with heart failure: Secondary | ICD-10-CM | POA: Insufficient documentation

## 2017-12-11 DIAGNOSIS — M79671 Pain in right foot: Secondary | ICD-10-CM | POA: Diagnosis present

## 2017-12-11 MED ORDER — PREDNISONE 50 MG PO TABS: ORAL_TABLET | ORAL | 0 refills | Status: DC

## 2017-12-11 MED ORDER — HYDROCODONE-ACETAMINOPHEN 5-325 MG PO TABS: 1.0000 | ORAL_TABLET | Freq: Four times a day (QID) | ORAL | 0 refills | Status: DC | PRN

## 2017-12-11 MED ORDER — HYDROMORPHONE HCL 1 MG/ML IJ SOLN
2.0000 mg | Freq: Once | INTRAMUSCULAR | Status: AC
  Administered 2017-12-11: 2 mg via INTRAMUSCULAR
  Filled 2017-12-11: qty 2

## 2017-12-11 MED ORDER — PREDNISONE 50 MG PO TABS
60.0000 mg | ORAL_TABLET | Freq: Once | ORAL | Status: AC
  Administered 2017-12-11: 60 mg via ORAL
  Filled 2017-12-11: qty 1

## 2017-12-11 NOTE — ED Provider Notes (Signed)
[facility] EMERGENCY DEPARTMENT Provider Note   CSN: [id number] Arrival date & time: 12/11/17  0355     History   Chief Complaint Chief Complaint  Patient presents with  . Foot Pain    HPI Isaac Valdez is a 57 y.o. male.  The history is provided by the patient.  Foot Pain  This is a new problem. The current episode started 2 days ago. The problem occurs constantly. The problem has been rapidly worsening. Pertinent negatives include no chest pain. The symptoms are aggravated by walking. The symptoms are relieved by rest.  Patient with history of CHF, diabetes, gout presents with right foot pain.  He denies any trauma or falls.  He reports the posterior aspect of the right foot he has swelling and pain.  He reports it hurts to walk and hurts to touch the area.  He has a long history of gout in multiple locations in the foot, but never in this location.  He does report the pain quality is similar to gout.  No fevers or vomiting.  No other acute complaints  Past Medical History:  Diagnosis Date  . Back pain   . CHF (congestive heart failure) (HCC)   . Diabetes mellitus   . Dyspnea   . Gout   . High cholesterol   . Hypertension     Patient Active Problem List   Diagnosis Date Noted  . AKI (acute kidney injury) (HCC) 03/19/2017  . GERD (gastroesophageal reflux disease) 03/19/2017  . 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 Gout 03/19/2017  . SOB (shortness of breath) 03/19/2017  . Hyperkalemia 03/19/2017  . Leukocytosis 03/19/2017  . Acute renal failure (HCC)   . Moderate to severe mitral regurgitation 10/06/2016  . Hypertensive urgency 03/05/2016  . Chest pain 03/05/2016  . Chronic systolic heart failure (HCC) 03/05/2016  . Hypertension   . High cholesterol   . Typical atrial flutter (HCC)   . Metabolic syndrome   . Atrial flutter (HCC) 03/04/2016    Past Surgical History:  Procedure Laterality Date  . A-FLUTTER  ABLATION N/A 04/21/2017   Procedure: A-FLUTTER ABLATION;  Surgeon: Regan Lemming, MD;  Location: [facility];  Service: Cardiovascular;  Laterality: N/A;  . BACK SURGERY    . MANDIBLE FRACTURE SURGERY          Home Medications    Prior to Admission medications   Medication Sig Start Date End Date Taking? Authorizing Provider  atorvastatin (LIPITOR) 10 MG tablet Take by mouth.   Yes [provider]  carvedilol (COREG) 6.25 MG tablet Take 1 tablet (6.25 mg total) by mouth 2 (two) times daily. 06/21/17  Yes Camnitz, Will Daphine Deutscher, MD  clonazePAM (KLONOPIN) 0.5 MG tablet Take 1 tablet (0.5 mg total) by mouth 2 (two) times daily as needed for anxiety. 11/01/17  Yes Tat, Octaviano Batty, DO  cloNIDine (CATAPRES) 0.2 MG tablet Take 0.2 mg by mouth as needed (elevated b/p).   Yes [provider]  diazepam (VALIUM) 5 MG tablet Take one tablet 45 minutes prior to MR (on arrival to facility) and one 15 minutes prior to MR (if needed), one more PRN 12/06/17  Yes Tat, Lurena Joiner S, DO  febuxostat (ULORIC) 40 MG tablet Take 80 mg by mouth daily.   Yes [provider]  glipiZIDE (GLUCOTROL) 5 MG tablet Take 5 mg by mouth daily as needed (CBG >120).  08/12/16  Yes [provider]  spironolactone (ALDACTONE) 25 MG  tablet Take 25 mg by mouth 2 (two) times daily.   Yes [provider]  torsemide (DEMADEX) 20 MG tablet Take 20 mg by mouth at bedtime.   Yes [provider]  allopurinol (ZYLOPRIM) 300 MG tablet Take 300 mg by mouth daily as needed (for gout flare ups).    [provider]  LORazepam (ATIVAN) 1 MG tablet Take 1 tablet (1 mg total) by mouth every 8 (eight) hours as needed for anxiety. 10/08/17   Couture, Cortni S, PA-C    Family History Family History  Problem Relation Age of Onset  . Heart disease Mother   . Hypertension Sister   . Other Father        hypothermia  . Heart failure Son     Social History Social History   Tobacco  Use  . Smoking status: Former Smoker    Packs/day: 0.50  . Smokeless tobacco: Never Used  Substance Use Topics  . Alcohol use: Yes    Comment: rarely  . Drug use: No     Allergies   Aspirin   Review of Systems Review of Systems  Constitutional: Negative for fever.  Cardiovascular: Negative for chest pain.  Gastrointestinal: Negative for vomiting.  Musculoskeletal: Positive for arthralgias and joint swelling.  All other systems reviewed and are negative.    Physical Exam Updated Vital Signs BP (!) 159/92 (BP Location: Right Arm)   Pulse 69   Temp 97.6 F (36.4 C) (Oral)   Resp 16   Ht 1.803 m (5\' 11" )   Wt (!) 139.7 kg   SpO2 100%   BMI 42.96 kg/m   Physical Exam CONSTITUTIONAL: Well developed/well nourished HEAD: Normocephalic/atraumatic EYES: EOMI ENMT: Mucous membranes moist NECK: supple no meningeal signs SPINE/BACK:entire spine nontender CV: S1/S2 noted, no murmurs/rubs/gallops noted LUNGS: Lungs are clear to auscultation bilaterally, no apparent distress ABDOMEN: soft, nontender NEURO: Pt is awake/alert/appropriate, moves all extremitiesx4.  No facial droop.   EXTREMITIES: pulses normal/equal, full ROM Right foot-posterior aspect of foot mild swelling and exquisite tenderness, no crepitus, no erythema, no fluctuance.  No bruising.  The rest of the right foot is unremarkable in appearance and nontender.  No wounds are noted to the foot.  Right Achilles appears intact There is no right ankle tenderness noted SKIN: warm, color normal PSYCH: Anxious  ED Treatments / Results  Labs (all labs ordered are listed, but only abnormal results are displayed) Labs Reviewed - No data to display  EKG None  Radiology Dg Foot Complete Right  Result Date: 12/11/2017 CLINICAL DATA:  57 y/o M; 3 days of heel pain. Swollen lump on the back of the heel. EXAM: RIGHT FOOT COMPLETE - 3+ VIEW COMPARISON:  None. FINDINGS: There is no evidence of fracture or dislocation.  Osteoarthrosis of the first intertarsal joint with periarticular osteophytes. Moderate dorsal and plantar calcaneal enthesophytes. Soft tissue thickening dorsal to the calcaneus may represent Achilles tendinitis. IMPRESSION: Soft tissue thickening dorsal to the calcaneus may represent Achilles tendinitis. 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 Moderate dorsal and plantar calcaneal enthesophytes. Electronically Signed   By: Mitzi Hansen M.D.   On: 12/11/2017 04:56    Procedures Procedures (including critical care time)  Medications Ordered in ED Medications  HYDROmorphone (DILAUDID) injection 2 mg (2 mg Intramuscular Given 12/11/17 0432)  predniSONE (DELTASONE) tablet 60 mg (60 mg Oral Given 12/11/17 0532)     Initial Impression / Assessment and Plan / ED Course  I have reviewed the triage vital signs  and the nursing notes. Narcotic database reviewed and considered in decision making  Pertinent  imaging results that were available during my care of the patient were reviewed by me and considered in my medical decision making (see chart for details).     4:52 AM Pt with  long history of gout presents with right foot pain.  This time his pain is in the posterior portion of the foot.  I obtained x-ray imaging of the foot.  We will treat pain.   5:36 AM X-ray results reviewed and discussed with patient.  Strong suspicion for Achilles tendinitis versus Achilles bursitis.  No signs of infectious etiology.  I would prefer to use NSAIDs for this condition, but he has an intolerance to them he also has renal insufficiency.  He has had good response to prednisone before, therefore we will do another burst of this.  He reports he has no narcotic pain medicines at home, short prescription of Vicodin will be ordered.  He has been referred to sports medicine.  Advised rest/elevate/ice/ limit activity.  I did advise him that he is high risk for Achilles rupture.  Final Clinical  Impressions(s) / ED Diagnoses   Final diagnoses:  Achilles bursitis of right lower extremity    ED Discharge Orders         Ordered    HYDROcodone-acetaminophen (NORCO/VICODIN) 5-325 MG tablet  Every 6 hours PRN     12/11/17 0527    predniSONE (DELTASONE) 50 MG tablet     12/11/17 0527           Zadie Rhine, MD 12/11/17 [phone]

## 2017-12-11 NOTE — ED Notes (Signed)
Returned from xray

## 2017-12-11 NOTE — ED Notes (Signed)
ED Provider at bedside. 

## 2017-12-11 NOTE — ED Notes (Signed)
Patient transported to X-ray 

## 2017-12-11 NOTE — ED Triage Notes (Signed)
Pt is c/o pain to the back of his right heel  Pt states it started a couple days ago  Pt has a knot noted on the back of his heel  Pt describes as a aching throbbing pain  Pt states hurts if anything touches it  Pt has hx of gout

## 2017-12-14 ENCOUNTER — Telehealth: Payer: Self-pay | Admitting: Neurology

## 2017-12-14 NOTE — Telephone Encounter (Signed)
Left message on machine for patient to call back.  To let him know [facility] labs were negative.  I have rescheduled his MRI at [facility] (he has valium RX) for 12/22/17 to arrive at 2:50 pm. They did state they tried to reach out to him, but did not get a call back.  Awaiting call back to make patient aware.

## 2017-12-14 NOTE — Telephone Encounter (Signed)
Patient called back and he was made aware.  

## 2017-12-14 NOTE — Telephone Encounter (Signed)
Patient is calling about his recent lab results back and wanted to know about another MRI. Please call him back at [phone]. Thanks!

## 2017-12-14 NOTE — Telephone Encounter (Signed)
Yes.  Paraneoplastic panel was negative.  Is he having MRI completed?  I know he cx his f/u a week or so ago.  Did he r/s?

## 2017-12-14 NOTE — Telephone Encounter (Signed)
[facility] labs received and placed on your desk. They looked negative. Please confirm.

## 2017-12-22 ENCOUNTER — Other Ambulatory Visit: Payer: Medicare HMO

## 2017-12-23 ENCOUNTER — Ambulatory Visit
Admission: RE | Admit: 2017-12-23 | Discharge: 2017-12-23 | Disposition: A | Discharge status: Home / Self Care | Payer: Medicare HMO | Source: Ambulatory Visit | Attending: Neurology | Admitting: Neurology

## 2017-12-24 ENCOUNTER — Encounter: Payer: Self-pay | Admitting: Neurology

## 2017-12-24 ENCOUNTER — Telehealth: Payer: Self-pay | Admitting: Neurology

## 2017-12-24 NOTE — Telephone Encounter (Signed)
-----   Message from Octaviano Batty Tat, DO sent at 12/24/2017  7:39 AM EDT ----- Let pt know that MRI is fine.  Put pt in next wed at 11:15

## 2017-12-24 NOTE — Telephone Encounter (Signed)
Dr. Tat - FYI. 

## 2017-12-24 NOTE — Telephone Encounter (Signed)
Left message on machine for patient to call back.  To let him know MR okay and schedule his appt. Awaiting call back.

## 2017-12-24 NOTE — Telephone Encounter (Signed)
Patient made aware. He can not come next Wednesday. I put him in workin day on Thursday 12/30/17.

## 2017-12-27 NOTE — Progress Notes (Signed)
Isaac Valdez was seen today in the movement disorders clinic for neurologic consultation at the request of Arvind, Idelia Salm, MD.  The consultation is for the evaluation of dyskinesia/abnormal movements in the face.  The records that were made available to me were reviewed.  Patient reports that symptoms started about 1 year ago.  He states that it seemed to start in the R arm but that is gone.  It is mostly in the R neck and R jaw.  The mouth and jaw are definitely the biggest issue.  He is not having trouble swallowing.  He notes that if he drinks 4-5 beers it will get better.  He states that the tongue is always moving and he cannot control it.  His mouth muscles are pulling open.  When he sleeps he uses bipap and he feels better but he will wake up biting the tongue.  He has started wearing a mouth piece at night.  That helps some.  He does describe some sensory tricks (put toothpick in between teeth but ended up having a "gasp" and it went into back of throat).    Pt does have a consult with Dr. Rubin Payor in 07/2018.  He was seen at [facility] ED on 08/02/17 by a neurology resident for dyskinesia.  It does not appear that he followed up there, with the exception of the emergency room.  The patient has tried amantadine without success.  He tried it for 2-3 weeks, 3 times per day.  They tried to get approval for Ingrezza, but given the diagnosis, was not approved. He tried xenazine but was only able to take it for 2 weeks and he had to d/c because of cost.  He tried valium without success, but doesn't know the appt.  He is now taking ativan prn; he last used it 3 days ago because he is feels panic attacks because of the movements.  No exposure to reglan or antipsychotics.     Neuroimaging of the brain has  previously been performed.  It is not available for my review today.  Reports are available, but no images.  Patient had an MRI of the brain without gadolinium on July 04, 2017 through [facility].  This was  reported to be normal.  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   Patient was also given samples of Ingrezza and states he took it for 2 weeks and then d/c as he didn't think it helpful.  He is on clonazepam 0.5 mg twice per day.  He isn't sure it is helpful.  Wife thinks that it is worse as day goes on.  Pt takes it with benadryl.  It doesn't make him sleepy at all.  Hates that tongue will thrust.  He had lab work since last visit.  His sedimentation rate came back very high at 125.  After that, it was felt a larger work-up, especially for paraneoplastic states that was warranted.  The patient had paraneoplastic testing through [facility] and that was negative.  MRI cervical spine was completed and demonstrated mild spinal stenosis at several levels.  There was a sclerotic lesion at the C5 vertebral body, but that appeared benign per radiology and was similar to his June 25, 2017 CT.  Ceruloplasmin was normal.  B12 is just slightly low at 389.  RPR was negative.  Anticardiolipin antibody was negative.  ANA was negative.  Ferritin was normal at 89.4.  PTH was slightly elevated at 70.  PREVIOUS MEDICATIONS:  amantadine, benadryl, xenazine for only 2 weeks because of cost, valium, ativan; insurance denied ingrezza  ALLERGIES:   Allergies  Allergen Reactions  . Aspirin Other (See Comments)    "indigestion"    CURRENT MEDICATIONS:  Outpatient Encounter Medications as of 12/30/2017  Medication Sig  . atorvastatin (LIPITOR) 10 MG tablet Take by mouth.  . carvedilol (COREG) 6.25 MG tablet Take 1 tablet (6.25 mg total) by mouth 2 (two) times daily.  . clonazePAM (KLONOPIN) 0.5 MG tablet Take 1 tablet (0.5 mg total) by mouth 2 (two) times daily as needed for anxiety.  . cloNIDine (CATAPRES) 0.2 MG tablet Take 0.2 mg by mouth as needed (elevated b/p).  Marland Kitchen glipiZIDE (GLUCOTROL) 5 MG tablet Take 5 mg by mouth daily as needed (CBG >120).   Marland Kitchen spironolactone (ALDACTONE) 25 MG  tablet Take 25 mg by mouth 2 (two) times daily.  Marland Kitchen torsemide (DEMADEX) 20 MG tablet Take 20 mg by mouth at bedtime.  . [DISCONTINUED] diazepam (VALIUM) 5 MG tablet Take one tablet 45 minutes prior to MR (on arrival to facility) and one 15 minutes prior to MR (if needed), one more PRN  . [DISCONTINUED] febuxostat (ULORIC) 40 MG tablet Take 80 mg by mouth daily.  . [DISCONTINUED] HYDROcodone-acetaminophen (NORCO/VICODIN) 5-325 MG tablet Take 1 tablet by mouth every 6 (six) hours as needed for severe pain.  . [DISCONTINUED] LORazepam (ATIVAN) 1 MG tablet Take 1 tablet (1 mg total) by mouth every 8 (eight) hours as needed for anxiety.  . [DISCONTINUED] predniSONE (DELTASONE) 50 MG tablet 1 tablet PO QD X4 days   No facility-administered encounter medications on file as of 12/30/2017.     PAST MEDICAL HISTORY:   Past Medical History:  Diagnosis Date  . Back pain   . CHF (congestive heart failure) (HCC)   . Diabetes mellitus   . Dyspnea   . Gout   . High cholesterol   . Hypertension     PAST SURGICAL HISTORY:   Past Surgical History:  Procedure Laterality Date  . A-FLUTTER ABLATION N/A 04/21/2017   Procedure: A-FLUTTER ABLATION;  Surgeon: Regan Lemming, MD;  Location: [facility] IN[facility] CV LAB;  Service: Cardiovascular;  Laterality: N/A;  . BACK SURGERY    . MANDIBLE FRACTURE SURGERY      SOCIAL HISTORY:   Social History   Socioeconomic History  . Marital status: Married    Spouse name: Not on file  . Number of children: 2  . Years of education: 29  . Highest education level: Not on file  Occupational History  . Occupation: part time truck Hospital doctor  . Occupation: on disability  Social Needs  . Financial resource strain: Not on file  . Food insecurity:    Worry: Not on file    Inability: Not on file  . Transportation needs:    Medical: Not on file    Non-medical: Not on file  Tobacco Use  . Smoking status: Former Smoker    Packs/day: 0.50  . Smokeless tobacco: Never Used    Substance and Sexual Activity  . Alcohol use: Yes    Comment: rarely  . Drug use: No  . Sexual activity: Yes    Birth control/protection: None  Lifestyle  . Physical activity:    Days per week: Not on file    Minutes per session: Not on file  . Stress: Not on file  Relationships  . Social connections:    Talks on phone: Not on file    Gets together:  Not on file    Attends religious service: Not on file    Active member of club or organization: Not on file    Attends meetings of clubs or organizations: Not on file    Relationship status: Not on file  . Intimate partner violence:    Fear of current or ex partner: Not on file    Emotionally abused: Not on file    Physically abused: Not on file    Forced sexual activity: Not on file  Other Topics Concern  . Not on file  Social History Narrative   Lives with wife in a one story home.  Has 2 children.  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  Education: 11th grade.     FAMILY HISTORY:   Family Status  Relation Name Status  . Mother  Deceased  . Sister  Alive  . Father  Deceased       hypothermia  . MGM  Deceased  . MGF  Deceased  . PGM  Deceased  . PGF  Deceased  . Son  Deceased  . Daughter  Alive  . Son  Alive    ROS:  Review of Systems  Constitutional: Negative.   HENT: Negative.   Eyes: Negative.   Respiratory: Negative.   Cardiovascular: Negative.   Gastrointestinal: Negative.   Genitourinary: Negative.   Skin: Negative.     PHYSICAL EXAMINATION:    VITALS:   Vitals:   12/30/17 0850  BP: (!) 144/72  Pulse: 66  SpO2: 96%  Weight: (!) 320 lb (145.2 kg)  Height: 5\' 11"  (1.803 m)    GEN:  The patient appears stated age and is in NAD. HEENT:  Normocephalic, atraumatic.  The mucous membranes are moist. The superficial temporal arteries are without ropiness or tenderness. CV:  RRR Lungs:  CTAB Neck/HEME:  There are no carotid bruits bilaterally.  Neurological  examination:  Orientation: The patient is alert and oriented x3. Cranial nerves: There is good facial symmetry. The speech is fluent and clear. Soft palate rises symmetrically and there is no tongue deviation. Hearing is intact to conversational tone. Sensation: Sensation is intact to light touch throughout Motor: Strength is 5/5 in the bilateral upper and lower extremities.   Shoulder shrug is equal and symmetric.  There is no pronator drift.  Movement examination: Tone: There is normal tone in the bilateral upper extremities.  The tone in the lower extremities is normal.  Abnormal movements: Pt has orobuccolingual dyskinesia.  He has jaw opening dystonia but much improved compared to last visit.  Rare movement of the R arm. Coordination:  There is no decremation with RAM's, with any form of RAMS, including alternating supination and pronation of the forearm, hand opening and closing, finger taps, heel taps and toe taps. Gait and Station: The patient has no difficulty arising out of a deep-seated chair without the use of the hands. The patient's stride length is normal.    Labs: Labs are reviewed.  On September 05, 2017, sodium was 140, potassium 4.0, chloride 106, CO2 22, BUN 21 and creatinine 1.47 (this is actually improved from the prior year at which point it was 1.92).  AST 34, ALT 33, alkaline phosphatase 81.  White blood cells were 4.2, hemoglobin 12.9, hematocrit 38.6 and platelets 243.  Lab Results  Component Value Date   TSH 2.190 03/05/2016   Lab Results  Component Value Date   ESRSEDRATE 125 Repeated and verified X2. 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 He has had paraneoplastic antibody testing which was negative.  He has had an unremarkable MRI of the brain and cervical spine.  Would recommend a lumbar puncture, but first decided to repeat sed rate and add CRP.  If it is still high, recommended that he have a medical work-up with primary care doctor for high sed rate.  If unknown etiology, we can do a lumbar puncture.    -Discussed that I generally do not do Botox for jaw opening dystonia.  This requires injections of the pterygoid muscles.  I do not think that these are done anywhere in the state, but will call to [facility] to see if they are now doing this.  -Increase clonazepam to 0.5 mg 3 times per day.  He is not sleepy, but discussed that I really do not want him driving while taking this.  -discussed that GPi DBS can help but is generally not done for Meige  -discussed sensory tricks that can be safely used to help (straw between teeth, etc).  He actually has a toothpick between his teeth today.  2. Follow up is anticipated in the next few months, sooner should new neurologic issues arise.  Much greater than 50% of this visit was spent in counseling and coordinating care.  Total face to face time:  25 min   Cc:  Karle Plumber, MD

## 2017-12-30 ENCOUNTER — Ambulatory Visit: Payer: Medicare HMO | Admitting: Neurology

## 2017-12-30 ENCOUNTER — Encounter: Payer: Self-pay | Admitting: Neurology

## 2017-12-30 ENCOUNTER — Other Ambulatory Visit (INDEPENDENT_AMBULATORY_CARE_PROVIDER_SITE_OTHER): Payer: Medicare HMO

## 2017-12-30 ENCOUNTER — Telehealth: Payer: Self-pay | Admitting: Neurology

## 2017-12-30 VITALS — BP 144/72 | HR 66 | Ht 71.0 in | Wt 320.0 lb

## 2017-12-30 DIAGNOSIS — R700 Elevated erythrocyte sedimentation rate: Secondary | ICD-10-CM

## 2017-12-30 DIAGNOSIS — G244 Idiopathic orofacial dystonia: Secondary | ICD-10-CM

## 2017-12-30 DIAGNOSIS — G249 Dystonia, unspecified: Secondary | ICD-10-CM

## 2017-12-30 MED ORDER — CLONAZEPAM 0.5 MG PO TABS: 0.5000 mg | ORAL_TABLET | Freq: Three times a day (TID) | ORAL | 2 refills | Status: DC

## 2017-12-30 NOTE — Telephone Encounter (Signed)
Spoke with Dr. Mickie Bail.  Like me, they don't do botox for Jaw opening dystonia.  She recommended Dr. Beverely Risen at [facility] who does that in conjunction with Dr. Assunta Curtis.  Isaac Valdez - see me for referral specifics  Talked with Dr. Mickie Bail.  After discussion..If ESR still elevated, things to consider.  LP with repeat paraneoplastic in CSF and dementia autoimmune panel (NMDA included).  Trial of methylpred x 5 days and wean to 60 mg daily for several weeks and see if improves.

## 2017-12-30 NOTE — Patient Instructions (Signed)
1. Your provider has requested that you have labwork completed today. Please go to [facility] ([id number]) on the second floor of this building before leaving the office today. You do not need to check in. If you are not called within 15 minutes please check with the front desk.   

## 2017-12-30 NOTE — Addendum Note (Signed)
Addended by: Silvio Pate on: 12/30/2017 09:32 AM   Modules accepted: Orders

## 2017-12-31 LAB — C-REACTIVE PROTEIN: CRP: 10.1 mg/L — AB (ref <8.0)

## 2017-12-31 LAB — SEDIMENTATION RATE: Sed Rate: 43 mm/h — ABNORMAL HIGH (ref 0–20)

## 2017-12-31 NOTE — Telephone Encounter (Signed)
Referral faxed to [facility] at [phone] with confirmation received.

## 2017-12-31 NOTE — Telephone Encounter (Signed)
-----  Message from [place], DO sent at 12/31/2017  7:30 AM EDT ----- Let pt know that ESR was much improved.  We will look at it in the future.

## 2017-12-31 NOTE — Telephone Encounter (Signed)
Patient made aware labs improved and we will be sending a referral for Botox consideration. They should call him directly to schedule.

## 2018-01-11 ENCOUNTER — Encounter: Payer: Self-pay | Admitting: Neurology

## 2018-01-25 DIAGNOSIS — E8881 Metabolic syndrome: Secondary | ICD-10-CM

## 2018-02-15 ENCOUNTER — Telehealth: Payer: Self-pay | Admitting: Neurology

## 2018-02-15 NOTE — Telephone Encounter (Signed)
Called patient. After seeing Dr. Vergie Living at [facility], he was to refer him to [facility] movement d/o specialist and he had not heard anything. He called Dr. Atha Starks office, but did not hear back yet. I made him aware that we can not help with this from our office since we are not involved in this referral. He was frustrated, but I encouraged him to follow up with their office.

## 2018-02-15 NOTE — Telephone Encounter (Signed)
Patient is calling in wanting to speak with someone in regards to his [facility] appt that was supposed to be scheduled. Please call him back at [phone]. Thanks!

## 2018-02-24 ENCOUNTER — Telehealth: Payer: Self-pay | Admitting: Neurology

## 2018-02-24 NOTE — Telephone Encounter (Signed)
Patient states that he saw a Dr in [facility] and was referred to another dr also but has not heard anything from them. He wants to know if we can help because he is still having problems

## 2018-02-24 NOTE — Telephone Encounter (Signed)
Find out status of referral to [facility] movement clinic

## 2018-02-24 NOTE — Telephone Encounter (Signed)
Dr. Arbutus Leas - you had sent him to [facility] for consideration of Botox. They were going to send him for another movement d/o opinion. I am not sure how to help him. He is still having symptoms. Please advise.

## 2018-02-25 NOTE — Telephone Encounter (Signed)
Called [facility] and they state referral is still in review with Dr. Nadara Mode. They were unsure of how long it would take for this to be reviewed and patient to be called. They sent a message to check on status.   Called patient and made him aware of this information. Gave him name and number to contact Dr. Nadara Mode. [phone].

## 2018-03-11 ENCOUNTER — Ambulatory Visit: Payer: Medicare HMO | Admitting: Neurology

## 2018-03-18 ENCOUNTER — Telehealth: Payer: Self-pay | Admitting: Neurology

## 2018-03-18 NOTE — Telephone Encounter (Signed)
Patient's new PCP office called and would like you to call them with the name of the Neurologist Office in [place]. The patient cannot remember the Doctor's name. She said he is new to their office. Please Call. Thanks

## 2018-03-21 NOTE — Telephone Encounter (Signed)
Tried to call Renee back with new PCP office with no answer.  If she calls back - patient saw Lawrence Marseilles, MD at [facility] and is being referred to [facility] D/O clinic with Dr. Nadara Mode but that is still under review. Will make her ware of this if she calls back, but was unable to reach anyone.

## 2018-03-23 ENCOUNTER — Telehealth: Payer: Self-pay | Admitting: Neurology

## 2018-03-23 NOTE — Telephone Encounter (Signed)
PCP office called back and left message with answering service. They did not leave return number. They will call back. I will give them the information in previous call note. Thanks

## 2018-04-05 ENCOUNTER — Telehealth: Payer: Self-pay | Admitting: Neurology

## 2018-04-05 NOTE — Telephone Encounter (Signed)
Tried to call back and the office is closed. They have called several times about this and I have tried to reach back - the PCP is never able to be reached. If we could pass along the information previously documented or get a fax number for me to send the information to them I can do that. I have also given this information to the patient twice.

## 2018-04-05 NOTE — Telephone Encounter (Signed)
Luster Landsberg is calling and she is needing to know the neurologist name that he was referred to in [place]? Patient cannot remember. Please Call. Thanks

## 2018-04-12 ENCOUNTER — Telehealth: Payer: Self-pay | Admitting: Neurology

## 2018-04-12 NOTE — Telephone Encounter (Signed)
Received a request for records from [facility] and faxed last two office notes, MR results, lab results and name of doctor from [facility] we referred to to fax number [phone] with confirmation received.

## 2018-04-19 ENCOUNTER — Telehealth: Payer: Self-pay | Admitting: Neurology

## 2018-04-19 ENCOUNTER — Other Ambulatory Visit: Payer: Self-pay | Admitting: Neurology

## 2018-04-19 MED ORDER — CLONAZEPAM 0.5 MG PO TABS: 0.5000 mg | ORAL_TABLET | Freq: Three times a day (TID) | ORAL | 1 refills | Status: DC

## 2018-04-19 NOTE — Telephone Encounter (Signed)
I will send refill but only until he sees [facility] neuro.  I see he has an appt on 06/02/18.

## 2018-04-19 NOTE — Telephone Encounter (Signed)
Patient needs a reill of the clonazepam called in to the [facility] on [address] in [place]

## 2018-04-27 ENCOUNTER — Encounter: Payer: Self-pay | Admitting: *Deleted

## 2018-04-29 ENCOUNTER — Encounter

## 2018-04-29 ENCOUNTER — Ambulatory Visit: Payer: Medicare HMO | Admitting: Neurology

## 2018-05-03 ENCOUNTER — Encounter: Payer: Self-pay | Admitting: Cardiology

## 2018-05-03 ENCOUNTER — Ambulatory Visit: Payer: Medicare Other | Admitting: Cardiology

## 2018-05-03 VITALS — BP 122/64 | HR 68 | Ht 71.0 in | Wt 320.0 lb

## 2018-05-03 DIAGNOSIS — I483 Typical atrial flutter: Secondary | ICD-10-CM

## 2018-05-03 NOTE — Progress Notes (Signed)
Electrophysiology Office Note   Date:  05/03/2018   ID:  Isaac Valdez, DOB 22-May-1960, MRN [id number]  PCP:  Si Gaul, DO  Cardiologist:  Allyson Sabal Primary Electrophysiologist:  Shellia Hartl Isaac Loa, MD    No chief complaint on file.    History of Present Illness: Isaac Valdez is a 58 y.o. male who is being seen today for the evaluation of atrial flutter at the request of Isaac Valdez. Presenting today for electrophysiology evaluation.  He has a history of CHF, diabetes, hypertension, hyperlipidemia.  He has a 30-pack-year history, quit smoking 5 years ago.  He also has typical atrial flutter.  Had atrial flutter ablation 04/21/17.  Today, denies symptoms of palpitations, chest pain, shortness of breath, orthopnea, PND, lower extremity edema, claudication, dizziness, presyncope, syncope, bleeding, or neurologic sequela. The patient is tolerating medications without difficulties.  Overall he is doing well.  He is cut back on alcohol and caffeine.  He has had over the last year, 2 episodes of palpitations.  One in which he went to the hospital at [facility] which occurred July 2019.  Aside from that he has done well without complaint.  He is able to exercise walking for a mile and a half in the mornings as well as going to the gym.   Past Medical History:  Diagnosis Date  . Back pain   . CHF (congestive heart failure) (HCC)   . Diabetes mellitus   . Dyspnea   . Gout   . High cholesterol   . Hypertension    Past Surgical History:  Procedure Laterality Date  . A-FLUTTER ABLATION N/A 04/21/2017   Procedure: A-FLUTTER ABLATION;  Surgeon: Regan Lemming, MD;  Location: [facility] INVASIVE CV LAB;  Service: Cardiovascular;  Laterality: N/A;  . BACK SURGERY    . MANDIBLE FRACTURE SURGERY       Current Outpatient Medications  Medication Sig Dispense Refill  . atorvastatin (LIPITOR) 10 MG tablet Take by mouth.    . carvedilol (COREG) 6.25 MG tablet Take 1 tablet (6.25 mg  total) by mouth 2 (two) times daily. 60 tablet 6  . clonazePAM (KLONOPIN) 0.5 MG tablet Take 1 tablet (0.5 mg total) by mouth 3 (three) times daily. 90 tablet 1  . glipiZIDE (GLUCOTROL) 5 MG tablet Take 5 mg by mouth daily as needed (CBG >120).     Marland Kitchen spironolactone (ALDACTONE) 25 MG tablet Take 25 mg by mouth 2 (two) times daily.    Marland Kitchen torsemide (DEMADEX) 20 MG tablet Take 20 mg by mouth at bedtime.    . cloNIDine (CATAPRES) 0.2 MG tablet Take 0.2 mg by mouth as needed (elevated b/p).     No current facility-administered medications for this visit.     Allergies:   Aspirin   Social History:  The patient  reports that he has quit smoking. He smoked 0.50 packs per day. He has never used smokeless tobacco. He reports current alcohol use. He reports that he does not use drugs.   Family History:  The patient's family history includes Heart disease in his mother; Heart failure in his son; Hypertension in his sister; Other in his father.    ROS:  Please see the history of present illness.   Otherwise, review of systems is positive for potation's, back pain.   All other systems are reviewed and negative.   PHYSICAL EXAM: VS:  BP 122/64   Pulse 68   Ht 5\' 11"  (1.803 m)   Wt (!) 320 lb (145.2 kg)  BMI 44.63 kg/m  , BMI Body mass index is 44.63 kg/m. GEN: Well nourished, well developed, in no acute distress  HEENT: normal  Neck: no JVD, carotid bruits, or masses Cardiac: RRR; no murmurs, rubs, or gallops,no edema  Respiratory:  clear to auscultation bilaterally, normal work of breathing GI: soft, nontender, nondistended, + BS MS: no deformity or atrophy  Skin: warm and dry Neuro:  Strength and sensation are intact Psych: euthymic mood, full affect  EKG:  EKG is ordered today. Personal review of the ekg ordered shows sinus rhythm, inferior TWI   Recent Labs: 06/25/2017: Platelets 272 10/08/2017: BUN 46; Creatinine, Ser 1.90; Hemoglobin 14.6; Potassium 3.6; Sodium 142    Lipid Panel      Component Value Date/Time   CHOL 215 (H) 03/05/2016 0348   TRIG 241 (H) 03/05/2016 0348   HDL 58 03/05/2016 0348   CHOLHDL 3.7 03/05/2016 0348   VLDL 48 (H) 03/05/2016 0348   LDLCALC 109 (H) 03/05/2016 0348     Wt Readings from Last 3 Encounters:  05/03/18 (!) 320 lb (145.2 kg)  12/30/17 (!) 320 lb (145.2 kg)  12/11/17 (!) 308 lb (139.7 kg)      Other studies Reviewed: Additional studies/ records that were reviewed today include: TTE 02/19/17  Review of the above records today demonstrates:  - Left ventricle: The cavity size was normal. Wall thickness was   increased in a pattern of mild LVH. Systolic function was mildly   reduced. The estimated ejection fraction was in the range of 45%   to 50%. - Aortic valve: Mildly calcified annulus. There was mild   regurgitation.   ASSESSMENT AND PLAN:  1.  Typical atrial flutter: Status post ablation 04/21/2017.  Is currently off Eliquis.  He has had recurrent palpitations and presented to [facility] last summer.  This is happened twice over the last year.  I have asked that we get records from that hospitalization for further review.  This patients CHA2DS2-VASc Score and unadjusted Ischemic Stroke Rate (% per year) is equal to 2.2 % stroke rate/year from a score of 2  Above score calculated as 1 point each if present [CHF, HTN, DM, Vascular=MI/PAD/Aortic Plaque, Age if 65-74, or Male] Above score calculated as 2 points each if present [Age > 75, or Stroke/TIA/TE]   2.  Hypertension: Well-controlled today  3.  Hyperlipidemia: Not on statins, followed by PCP.  4.  Chronic systolic heart failure: On optimal medical therapy.  No changes.  5.  T wave inversions: No current chest pain.  New since ablation.  We Gracee Ratterree continue to monitor.  Current medicines are reviewed at length with the patient today.   The patient does not have concerns regarding his medicines.  The following changes were made today: None  Labs/ tests  ordered today include:  Orders Placed This Encounter  Procedures  . EKG 12-Lead     Disposition:   FU with Ariele Vidrio 6 months  Signed, Dalia Jollie Isaac Loa, MD  05/03/2018 4:32 PM     [facility] [address] [place] [address] [phone] (office) [phone] (fax)

## 2018-05-03 NOTE — Patient Instructions (Signed)
Medication Instructions:  Your physician recommends that you continue on your current medications as directed. Please refer to the Current Medication list given to you today.  If you need a refill on your cardiac medications before your next appointment, please call your pharmacy.   Labwork: None ordered  Testing/Procedures: None ordered  Follow-Up: Your physician wants you to follow-up in: 6 months with Dr. Camnitz.  You will receive a reminder letter in the mail two months in advance. If you don't receive a letter, please call our office to schedule the follow-up appointment.  Thank you for choosing [facility]!!   Gemini Beaumier, RN [phone]         

## 2018-05-09 ENCOUNTER — Telehealth: Payer: Self-pay | Admitting: Cardiology

## 2018-05-09 NOTE — Telephone Encounter (Signed)
Medical records requested from [facility]. 05/09/18 vlm

## 2018-05-23 ENCOUNTER — Other Ambulatory Visit: Payer: Self-pay

## 2018-05-23 MED ORDER — CARVEDILOL 6.25 MG PO TABS: 6.2500 mg | ORAL_TABLET | Freq: Two times a day (BID) | ORAL | 3 refills | Status: DC

## 2018-05-31 ENCOUNTER — Telehealth: Payer: Self-pay | Admitting: *Deleted

## 2018-05-31 MED ORDER — APIXABAN 5 MG PO TABS: 5.0000 mg | ORAL_TABLET | Freq: Two times a day (BID) | ORAL | 5 refills | Status: DC

## 2018-05-31 NOTE — Telephone Encounter (Addendum)
Followed up with patient about possible ablation.  Pt would prefer to wait at this time.  He is not really sure he would like to have another procedure.  Pt will call the office if he changes his mind. Also informed that we received records from [facility] (from last summer).  AFib/flutter seen.  Advised to re-start blood thinner, Eliquis 5 mg BID.  Pt agreeable.  Pt reports experiencing "fluttering episodes" every several weeks.  Episodes only last 1-2 minutes.  Discussed w/ Camnitz -- pt advised to continue to monitor for now.  Due to current pandemic non urgent needs being cancelled, pt understands to keep monitoring. He will call the office issues begin or 'episodes' become more frequent or change in nature.

## 2018-06-01 ENCOUNTER — Telehealth: Payer: Self-pay

## 2018-06-01 NOTE — Telephone Encounter (Signed)
Pt going to try and pick up samples today. Left at front desk per supervisor. Will forward to prior auth dept to see what options are out there for patient  (he is not sure that he has met his deductible)

## 2018-06-03 NOTE — Telephone Encounter (Signed)
**Note De-Identified Isaac Valdez Obfuscation** I called Isaac Valdez and was advised that a 90 day supply of Isaac Valdez will cost the pt $236 and a 30 day supply will be $142 as the pt has not met his deductible.  The pt has Medicare so I referred to the Isaac Valdez Medicaid and Health Choice Preferred Drug List (PDL) and found that Isaac Valdez is on the preferred list of covered meds.  The pt is not eligible for pt asst as he has insurance coverage for Isaac Valdez and is not in the "donut hole". Since Isaac Valdez is already a tier 3 drug a tier exception will not be granted under Medicare Part D rules.  I called the pt who states that he cannot afford the deductible and he is requesting a less expensive mediation.  I did briefly discuss Coumadin with the pt and states that he is not opposed to taking.  Will forward to Dr Curt Bears and his nurse for advisement to the pt.

## 2018-06-06 NOTE — Telephone Encounter (Signed)
Pt aware.  Pt will call office back if he interested in switching to Coumadin.

## 2018-06-06 NOTE — Telephone Encounter (Addendum)
Please see my note from 3/27: I called [place] and was advised that a 90 day supply of Eliquis will cost the pt $236 and a 30 day supply will be $142 as the pt has not met his deductible.  The pt has Medicare so I referred to the [place] Medicaid and Health Choice Preferred Drug List (PDL) and found that Eliquis is on the preferred list of covered meds as is Xarelto.  The pt is not eligible for pt asst as he has insurance coverage for Eliquis and is not in the "donut hole". Since Eliquis is already a tier 3 drug a tier exception will not be granted under Medicare Part D rules.  I explained all of this to the pt in detail on 3/27. Please notify the pt that until he meets his deductible I cannot asst with the price as Eliquis or Xarelto as they are brand name and he has Medicare PartD.    Also the pt should be advised to contact his insurance company to ask any further questions. Thanks.

## 2018-06-06 NOTE — Telephone Encounter (Signed)
Pt states that he does not want to go on Coumadin b/c of having to watch his dietary intake. Pt aware I will send back to [place] to see if Xarelto is cheaper cost option for pt. Pt aware I will call him once she had looked into Xarelto cost. Patient verbalized understanding and agreeable to plan.

## 2018-08-12 ENCOUNTER — Telehealth: Payer: Self-pay | Admitting: Cardiology

## 2018-08-12 NOTE — Telephone Encounter (Signed)
**Note De-Identified Asianae Minkler Obfuscation** I have addressed this with Dr Gershon Crane nurse and the pt (see phone note from 06/01/18). Not sure why the pt is asking for samples. Will forward note to Sherri.

## 2018-08-12 NOTE — Telephone Encounter (Signed)
**Note De-Identified Valdez Obfuscation** Hey Isaac Via, LPN could you take a look at this for me and advise what needs to be done for this pt please. thanks

## 2018-08-12 NOTE — Telephone Encounter (Signed)
New Message    Patient calling the office for samples of medication:   1.  What medication and dosage are you requesting samples for? Eliquis 5mg  1 tablet 2 times a day  2.  Are you currently out of this medication?   Yes

## 2018-08-12 NOTE — Telephone Encounter (Signed)
I routed this to you since you are in office

## 2018-08-12 NOTE — Telephone Encounter (Signed)
Pt aware I will follow up with him next week when back in the office about samples.

## 2018-08-19 NOTE — Telephone Encounter (Signed)
Pt aware samples left at front desk of the [place] office. Pt appreciates the help with this.

## 2018-08-25 ENCOUNTER — Telehealth: Payer: Self-pay | Admitting: Neurology

## 2018-08-25 NOTE — Telephone Encounter (Signed)
Called patient he was informed provider information

## 2018-08-25 NOTE — Telephone Encounter (Signed)
Patient called in about the BOTOX Doctor at [facility]. He said he forgot their name and he was trying to reach them to R/S an appt. Please call him back. Thanks!

## 2018-08-25 NOTE — Telephone Encounter (Signed)
Annamaria Helling, CMA     10:59 AM Note   Tried to call Renee back with new PCP office with no answer.  If she calls back - patient saw Unice Bailey, MD at [facility] and is being referred to [facility] D/O clinic with Dr. Mickie Bail but that is still under review. Will make her ware of this if she calls back, but was unable to reach anyone.

## 2018-09-12 ENCOUNTER — Telehealth: Payer: Self-pay | Admitting: Neurology

## 2018-09-12 NOTE — Telephone Encounter (Signed)
Patient is sch for 09-19-18 at 11:15 and is aware that this is not for injection

## 2018-09-12 NOTE — Telephone Encounter (Signed)
Got a call this AM from Dr. Lindwood Qua at [facility].  She saw pt over telemedicine.  She did not see the jaw opening component and thought that trying botox in platysma, masseter, supraspinatous, perhaps trap would be beneficial to the patient.  Happy to try this and see if it helps.  Isaac Valdez, please bring him in next Monday at 11:15 in the open slot for an appointment so that we can get botox prior auth since it has been so long since I have seen him.  Tell him that this won't be for the injections.

## 2018-09-15 NOTE — Progress Notes (Signed)
Isaac Valdez was seen today in the movement disorders clinic for neurologic consultation at the request of Si GaulGriffin, Jenny M, DO.  The consultation is for the evaluation of dyskinesia/abnormal movements in the face.  The records that were made available to me were reviewed.  Patient reports that symptoms started about 1 year ago.  He states that it seemed to start in the R arm but that is gone.  It is mostly in the R neck and R jaw.  The mouth and jaw are definitely the biggest issue.  He is not having trouble swallowing.  He notes that if he drinks 4-5 beers it will get better.  He states that the tongue is always moving and he cannot control it.  His mouth muscles are pulling open.  When he sleeps he uses bipap and he feels better but he will wake up biting the tongue.  He has started wearing a mouth piece at night.  That helps some.  He does describe some sensory tricks (put toothpick in between teeth but ended up having a "gasp" and it went into back of throat).    Pt does have a consult with Dr. Rubin PayorSiddiqui in 07/2018.  [facility] by a neurology resident for dyskinesia.  It does not appear that he followed up there, with the exception of the emergency room.  The patient has tried amantadine without success.  He tried it for 2-3 weeks, 3 times per day.  They tried to get approval for Ingrezza, but given the diagnosis, was not approved. He tried xenazine but was only able to take it for 2 weeks and he had to d/c because of cost.  He tried valium without success, but doesn't know the appt.  He is now taking ativan prn; he last used it 3 days ago because he is feels panic attacks because of the movements.  No exposure to reglan or antipsychotics.     Neuroimaging of the brain has  previously been performed.  It is not available for my review today.  Reports are available, but no images.  Patient had an MRI of the brain without gadolinium on July 04, 2017 [facility].  This was  reported to be normal.  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   Patient was also given samples of Ingrezza and states he took it for 2 weeks and then d/c as he didn't think it helpful.  He is on clonazepam 0.5 mg twice per day.  He isn't sure it is helpful.  Wife thinks that it is worse as day goes on.  Pt takes it with benadryl.  It doesn't make him sleepy at all.  Hates that tongue will thrust.  He had lab work since last visit.  His sedimentation rate came back very high at 125.  After that, it was felt a larger work-up, especially for paraneoplastic states that was warranted.  The patient [facility] and that was negative.  MRI cervical spine was completed and demonstrated mild spinal stenosis at several levels.  There was a sclerotic lesion at the C5 vertebral body, but that appeared benign per radiology and was similar to his June 25, 2017 CT.  Ceruloplasmin was normal.  B12 is just slightly low at 389.  RPR was negative.  Anticardiolipin antibody was negative.  ANA was negative.  Ferritin was normal at 89.4.  PTH was slightly elevated at 70.  09/19/2018: Patient  seen today in follow-up regarding dystonia.  Patient looked much better to me last visit, although the patient stated that he was not much better.  I did increase his clonazepam from 0.5 mg twice per day to 0.5 mg 3 times per day.  He had a very extensive neurologic work-up because of a high sed rate, which was negative, and the repeat sed rate was only 43 (initially 125).  His CRP was elevated at 10.1 (cardiac history).  He just saw Dr. Carmelina Peal at [facility].  She saw the patient over telemedicine.  She called me to discuss the case.  She did not see any of the jaw opening dystonia component, and states that the patient had told her that that was not an issue (was a big issue when I saw him).  He told her that the biggest issue was in the neck and upper arm.  She thought that  trying small amounts of Botox in the platysma, masseter and shoulder elevator (perhaps supraspinatus) would be beneficial.  He has seen Dr. York Pellant (ENT) at [facility] and he did not think he could help the patient, primarily because he did not notice the jaw opening dystonia either.  Patient reports today that the jaw does still open, and can be terrible, but it is somewhat better with the clonazepam that he is taking.  He combines clonazepam with Benadryl and still was not sleepy.  He complains of constant tongue movement within the mouth.  He complains of neck and shoulder tightness on the right.   PREVIOUS MEDICATIONS: amantadine, benadryl, xenazine for only 2 weeks because of cost, valium, ativan; insurance denied ingrezza  ALLERGIES:   Allergies  Allergen Reactions  . Aspirin Other (See Comments) and Anaphylaxis    "indigestion" "indigestion" Other reaction(s): Other (See Comments), Other (See Comments) "indigestion" "indigestion"    CURRENT MEDICATIONS:  Outpatient Encounter Medications as of 09/19/2018  Medication Sig  . apixaban (ELIQUIS) 5 MG TABS tablet Take 1 tablet (5 mg total) by mouth 2 (two) times daily.  Marland Kitchen atorvastatin (LIPITOR) 10 MG tablet Take by mouth.  . carvedilol (COREG) 6.25 MG tablet Take 1 tablet (6.25 mg total) by mouth 2 (two) times daily.  . clonazePAM (KLONOPIN) 0.5 MG tablet Take 1 tablet (0.5 mg total) by mouth 3 (three) times daily.  . famotidine (PEPCID) 20 MG tablet Take 20 mg by mouth 2 (two) times daily.  Marland Kitchen glipiZIDE (GLUCOTROL) 5 MG tablet Take 5 mg by mouth daily as needed (CBG >120).   . torsemide (DEMADEX) 20 MG tablet Take 20 mg by mouth at bedtime.  . [DISCONTINUED] cloNIDine (CATAPRES) 0.2 MG tablet Take 0.2 mg by mouth as needed (elevated b/p).  . [DISCONTINUED] spironolactone (ALDACTONE) 25 MG tablet Take 25 mg by mouth 2 (two) times daily.   No facility-administered encounter medications on file as of 09/19/2018.     PAST MEDICAL HISTORY:    Past Medical History:  Diagnosis Date  . Back pain   . CHF (congestive heart failure) (HCC)   . Diabetes mellitus   . Dyspnea   . Gout   . High cholesterol   . Hypertension     PAST SURGICAL HISTORY:   Past Surgical History:  Procedure Laterality Date  . A-FLUTTER ABLATION N/A 04/21/2017   Procedure: A-FLUTTER ABLATION;  Surgeon: Regan Lemming, MD;  Location: [facility] INVASIVE CV LAB;  Service: Cardiovascular;  Laterality: N/A;  . BACK SURGERY    . MANDIBLE FRACTURE SURGERY      SOCIAL  HISTORY:   Social History   Socioeconomic History  . Marital status: Married    Spouse name: Not on file  . Number of children: 2  . Years of education: 13  . Highest education level: 11th grade  Occupational History  . Occupation: part time truck Geophysicist/field seismologist  . Occupation: on disability  Social Needs  . Financial resource strain: Not on file  . Food insecurity    Worry: Not on file    Inability: Not on file  . Transportation needs    Medical: Not on file    Non-medical: Not on file  Tobacco Use  . Smoking status: Former Smoker    Packs/day: 0.50  . Smokeless tobacco: Never Used  Substance and Sexual Activity  . Alcohol use: Not Currently    Comment: rarely  . Drug use: No  . Sexual activity: Yes    Birth control/protection: None  Lifestyle  . Physical activity    Days per week: Not on file    Minutes per session: Not on file  . Stress: Not on file  Relationships  . Social Herbalist on phone: Not on file    Gets together: Not on file    Attends religious service: Not on file    Active member of club or organization: Not on file    Attends meetings of clubs or organizations: Not on file    Relationship status: Not on file  . Intimate partner violence    Fear of current or ex partner: Not on file    Emotionally abused: Not on file    Physically abused: Not on file    Forced sexual activity: Not on file  Other Topics Concern  . Not on file  Social History  Narrative   Lives with wife in a one story home.  Has 2 children.  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  Education: 11th grade.     FAMILY HISTORY:   Family Status  Relation Name Status  . Mother  Deceased  . Sister  Alive  . Father  Deceased       hypothermia  . MGM  Deceased  . MGF  Deceased  . PGM  Deceased  . PGF  Deceased  . Son  Deceased  . Daughter  Alive  . Son  Alive    ROS:  Review of Systems  Constitutional: Negative.   HENT: Negative.   Eyes: Negative.   Respiratory: Negative.   Cardiovascular: Negative.   Gastrointestinal: Negative.   Genitourinary: Negative.   Skin: Negative.     PHYSICAL EXAMINATION:    VITALS:   Vitals:   09/19/18 1123  BP: (!) 121/51  Pulse: 72  Temp: 97.9 F (36.6 C)  SpO2: 99%  Weight: (!) 320 lb 12.8 oz (145.5 kg)  Height: 5\' 11"  (1.803 m)    GEN:  The patient appears stated age and is in NAD. HEENT:  Normocephalic, atraumatic.  The mucous membranes are moist. The superficial temporal arteries are without ropiness or tenderness. CV:  RRR Lungs:  CTAB Neck/HEME:  There are no carotid bruits bilaterally.  Neurological examination:  Orientation: The patient is alert and oriented x3. Cranial nerves: There is good facial symmetry. The speech is fluent and clear. Soft palate rises symmetrically and there is no tongue deviation. Hearing is intact to conversational tone. Sensation: Sensation is intact to light touch throughout Motor: Strength is 5/5 in the bilateral upper and lower extremities.  Shoulder shrug is equal and symmetric.  There is no pronator drift.  Patient's shirt was removed for examination  Movement examination: Tone: There is normal tone in the bilateral upper extremities.  The tone in the lower extremities is normal.  Abnormal movements: Pt has orobuccolingual dyskinesia, which was noticed previously.  Still has jaw opening dystonia, but is improved compared to the first time I saw him.   He has some contraction of the platysma.  He has definite contraction of the right sternocleidomastoid.  Head is somewhat tilted to the left.  He has very little movement of the right arm, but occasionally it is noted.  There is significant tightness of the right trapezius, but no movement is noted of the right supraspinatus (my neuromuscular partner examined the patient with me as well) Coordination:  There is no decremation with RAM's, with any form of RAMS, including alternating supination and pronation of the forearm, hand opening and closing, finger taps, heel taps and toe taps. Gait and Station: The patient has no difficulty arising out of a deep-seated chair without the use of the hands. The patient's stride length is normal.    Labs: Labs are reviewed.  On September 05, 2017, sodium was 140, potassium 4.0, chloride 106, CO2 22, BUN 21 and creatinine 1.47 (this is actually improved from the prior year at which point it was 1.92).  AST 34, ALT 33, alkaline phosphatase 81.  White blood cells were 4.2, hemoglobin 12.9, hematocrit 38.6 and platelets 243.  Lab Results  Component Value Date   TSH 2.190 03/05/2016   Lab Results  Component Value Date   ESRSEDRATE 43 (H) 12/30/2017   Lab Results  Component Value Date   VITAMINB12 389 11/01/2017   Lab Results  Component Value Date   ANA NEGATIVE 11/01/2017   Lab Results  Component Value Date   PTH 70 (H) 11/01/2017   CALCIUM 9.6 06/25/2017   CAION 1.08 (L) 10/08/2017   Lab Results  Component Value Date   FERRITIN 89.4 11/01/2017     ASSESSMENT/PLAN:  1.  Meige syndrome with associated craniocervical dystonia  -Continue clonazepam 0.5 mg 3 times per day.  I continue to note the jaw opening dystonia, but it is still markedly better than when he first came.  I still think that pterygoid injections would be beneficial, but [facility] did not see the jaw opening dystonia (that being said, he was seen on video, and my suspicion is that they did not  see him well).   -We will schedule the patient for Botox.  He and I discussed risk, benefits, and side effects.  He understands that I cannot do the pterygoids.  We will try the masseter, platysma, right sternocleidomastoid, and right trapezius.  I may consider the left levator scapula I in the future, but this head tilt did not seem to be that bothersome and is quite mild.  This is certainly not going to help the tongue movements, which are bothersome to him.  However, he has tried medications, including Ingrezza, without relief.      Cc:  Si GaulGriffin, Jenny M, DO

## 2018-09-19 ENCOUNTER — Ambulatory Visit (INDEPENDENT_AMBULATORY_CARE_PROVIDER_SITE_OTHER): Payer: Medicare Other | Admitting: Neurology

## 2018-09-19 ENCOUNTER — Encounter: Payer: Self-pay | Admitting: Neurology

## 2018-09-19 ENCOUNTER — Other Ambulatory Visit: Payer: Self-pay

## 2018-09-19 VITALS — BP 121/51 | HR 72 | Temp 97.9°F | Ht 71.0 in | Wt 320.8 lb

## 2018-09-19 DIAGNOSIS — G243 Spasmodic torticollis: Secondary | ICD-10-CM

## 2018-09-19 DIAGNOSIS — G244 Idiopathic orofacial dystonia: Secondary | ICD-10-CM

## 2018-09-23 ENCOUNTER — Telehealth: Payer: Self-pay

## 2018-09-23 NOTE — Telephone Encounter (Signed)
Received insurance information for patient. Will started process today

## 2018-09-23 NOTE — Telephone Encounter (Signed)
Called patient several times for update insurance information to get Botox benefit verification and PA.  Process insurance on file which was [employer] patient not covered/ policy terminated.  Called patient to get correct information. No answer  Called patient spouse Althea she states that patient has [employer]. She will have patient call office with correct insurance information to started benefit verification/ PA   Waiting for patient to return call to start process.

## 2018-09-27 NOTE — Progress Notes (Signed)
Your request has been approved Request Reference Number: [id number]. BOTOX INJ 200UNIT is approved through 12/28/2018. For further questions, call [phone].  speciality pharmacy [facility] Will send over Rx to have botox ship to office.

## 2018-09-30 ENCOUNTER — Telehealth: Payer: Self-pay

## 2018-09-30 NOTE — Telephone Encounter (Signed)
Called [facility] to scheduled deliver for patient Botox appt scheduled for 10/14/18. Spoke with Lissa Hoard he states that they spoke with patient yesterday regarding Rx. Pt is requesting financial assistance. Lissa Hoard states that someone from financial assistance will be calling patient to discuss his options. Once this has been done someone will contact our office to set up shipment delivery.  Pt was also given the number for Botox saving program.

## 2018-10-05 NOTE — Telephone Encounter (Signed)
Called [facility] special[facility] pharmacy for patient Botox shipment spoke with Abigail Butts they have tried calling patient several times for additional information to help with financial assistance they are unable to reach patient left message no return call from patient. 3rd final call was today.   Pt co-pay $385  Tried calling patient to have him contact [facility] for financial assistance no answer voice mail box not set up for messages.   Also tried calling patient spouse no answer left message concerning [facility] to have patient contact them for financial assistance.

## 2018-10-06 NOTE — Telephone Encounter (Signed)
Pt called back he was informed to contact [facility]  Pt given contact info to call He will follow up with office once that has been done  So we can get shipment for botox to office prior to appt. Next Friday

## 2018-10-14 ENCOUNTER — Other Ambulatory Visit: Payer: Self-pay

## 2018-10-14 ENCOUNTER — Ambulatory Visit (INDEPENDENT_AMBULATORY_CARE_PROVIDER_SITE_OTHER): Payer: Medicare Other | Admitting: Neurology

## 2018-10-14 DIAGNOSIS — G243 Spasmodic torticollis: Secondary | ICD-10-CM | POA: Diagnosis not present

## 2018-10-14 MED ORDER — ONABOTULINUMTOXINA 100 UNITS IJ SOLR
200.0000 [IU] | Freq: Once | INTRAMUSCULAR | Status: AC
  Administered 2018-10-14: 200 [IU] via INTRAMUSCULAR

## 2018-10-14 NOTE — Progress Notes (Addendum)
0 waste  Pt given 200 units today Pt supplies medication Special[facility] pharmacy

## 2018-10-14 NOTE — Procedures (Signed)
Botulinum Clinic   Procedure Note Botox  Attending: Dr. Wells Guiles Dracen Reigle  Preoperative Diagnosis(es): Cervical Dystonia; meige syndrome  Result History  Onset of effect: n/a  Duration of Benefit: n/a  Adverse Effects: n/a   Consent obtained from: The patient Benefits discussed included, but were not limited to decreased muscle tightness, increased joint range of motion, and decreased pain.  Risk discussed included, but were not limited pain and discomfort, bleeding, bruising, excessive weakness, venous thrombosis, muscle atrophy and dysphagia.  A copy of the patient medication guide was given to the patient which explains the blackbox warning.  Patients identity and treatment sites confirmed Yes.  .  Details of Procedure: Skin was cleaned with alcohol.  A 30 gauge, 66mm  needle was introduced to the target muscle.   Prior to injection, the needle plunger was aspirated to make sure the needle was not within a blood vessel.  There was no blood retrieved on aspiration.    Following is a summary of the muscles injected  And the amount of Botulinum toxin used:   Dilution 0.9% preservative free saline mixed with 100 u Botox type A to make 10 U per 0.1cc  Injections  Location Left  Right Units Number of sites        Sternocleidomastoid  40 40 1  Splenius Capitus,       Masseter 30 30 60   platysma 2.5 x 5 2.5 x 5 25 10   Trapezius  20/20/20/15 75 4        TOTAL UNITS:   200    Agent: Botulinum Type A ( Onobotulinum Toxin type A ).  2 vials of Botox were used, each containing 100 units and freshly diluted with 1 mL of sterile, non-preserved saline   Total injected (Units): 200  Total wasted (Units): none wasted   Pt tolerated procedure well without complications.   Reinjection is anticipated in 3 months.

## 2018-11-10 ENCOUNTER — Encounter: Payer: Self-pay | Admitting: Neurology

## 2018-11-10 NOTE — Progress Notes (Signed)
Pt had virtual appt scheduled today.  Started calling/sending text for virtual appt 15 min prior to visit.  Multiple texts sent out.  Pt never responded.  As of 10:16 AM (pts visit at 9:45 am, starting calling him at 9:30 AM), pt had not logged into visit or called the office.  Pt no showed the visit.

## 2018-11-11 ENCOUNTER — Encounter: Payer: Self-pay | Admitting: Neurology

## 2018-11-11 ENCOUNTER — Telehealth (INDEPENDENT_AMBULATORY_CARE_PROVIDER_SITE_OTHER): Payer: Medicare Other | Admitting: Neurology

## 2018-11-11 ENCOUNTER — Other Ambulatory Visit: Payer: Self-pay

## 2018-11-24 ENCOUNTER — Ambulatory Visit: Payer: Medicare Other | Admitting: Neurology

## 2018-12-01 ENCOUNTER — Telehealth: Payer: Self-pay | Admitting: Neurology

## 2018-12-01 NOTE — Telephone Encounter (Signed)
Noted! Thank you

## 2018-12-01 NOTE — Telephone Encounter (Signed)
Patient called requesting to speak with the nurse to see if he can move his appointment for Botox up from 01/13/2019. I attempted to reschedule his recent missed appointment for follow up with Dr. Carles Collet but he declined requesting to speak with the nurse instead.

## 2018-12-01 NOTE — Telephone Encounter (Signed)
Patient scheduled for 12/30/2018 at 1:00 PM with Dr. Carles Collet. He has been added to the wait list as well.

## 2018-12-01 NOTE — Telephone Encounter (Signed)
Spoke with patient unable to move botox appt up reason being insurance will not cover if before every 91 days Pt aware and understands He will schedule appt to see Dr. Carles Collet transfer to front desk for appt

## 2018-12-15 ENCOUNTER — Telehealth: Payer: Self-pay

## 2018-12-15 NOTE — Telephone Encounter (Signed)
Received fax from [facility] regarding patient Botox  P# [phone] Fax# [phone]  RE: ATTEMPTED PATIENT CONTACT  This communication is to inform you that we have made several attempots to contact the above patient to fill his or her.  Botox SDV 100 units PWD  Please notify us if there have been changes to this patient therapy or updated contact number on file for this patient.    ~called spoke with patient informed him that [facility] has made several attempts to contact him regarding his botox. Pt was given contact number to call [facility] regarding botox/ shipment consent.  He states that he will call them.

## 2018-12-19 ENCOUNTER — Telehealth: Payer: Self-pay | Admitting: Neurology

## 2018-12-19 IMAGING — CR DG CHEST 2V
2 series · 2 of 2 positions shown · non-contrast
Comparison: 09/27/2016

CLINICAL DATA: Shortness of breath with exertion over the last
week.

EXAM:
CHEST  2 VIEW

[chest pa]
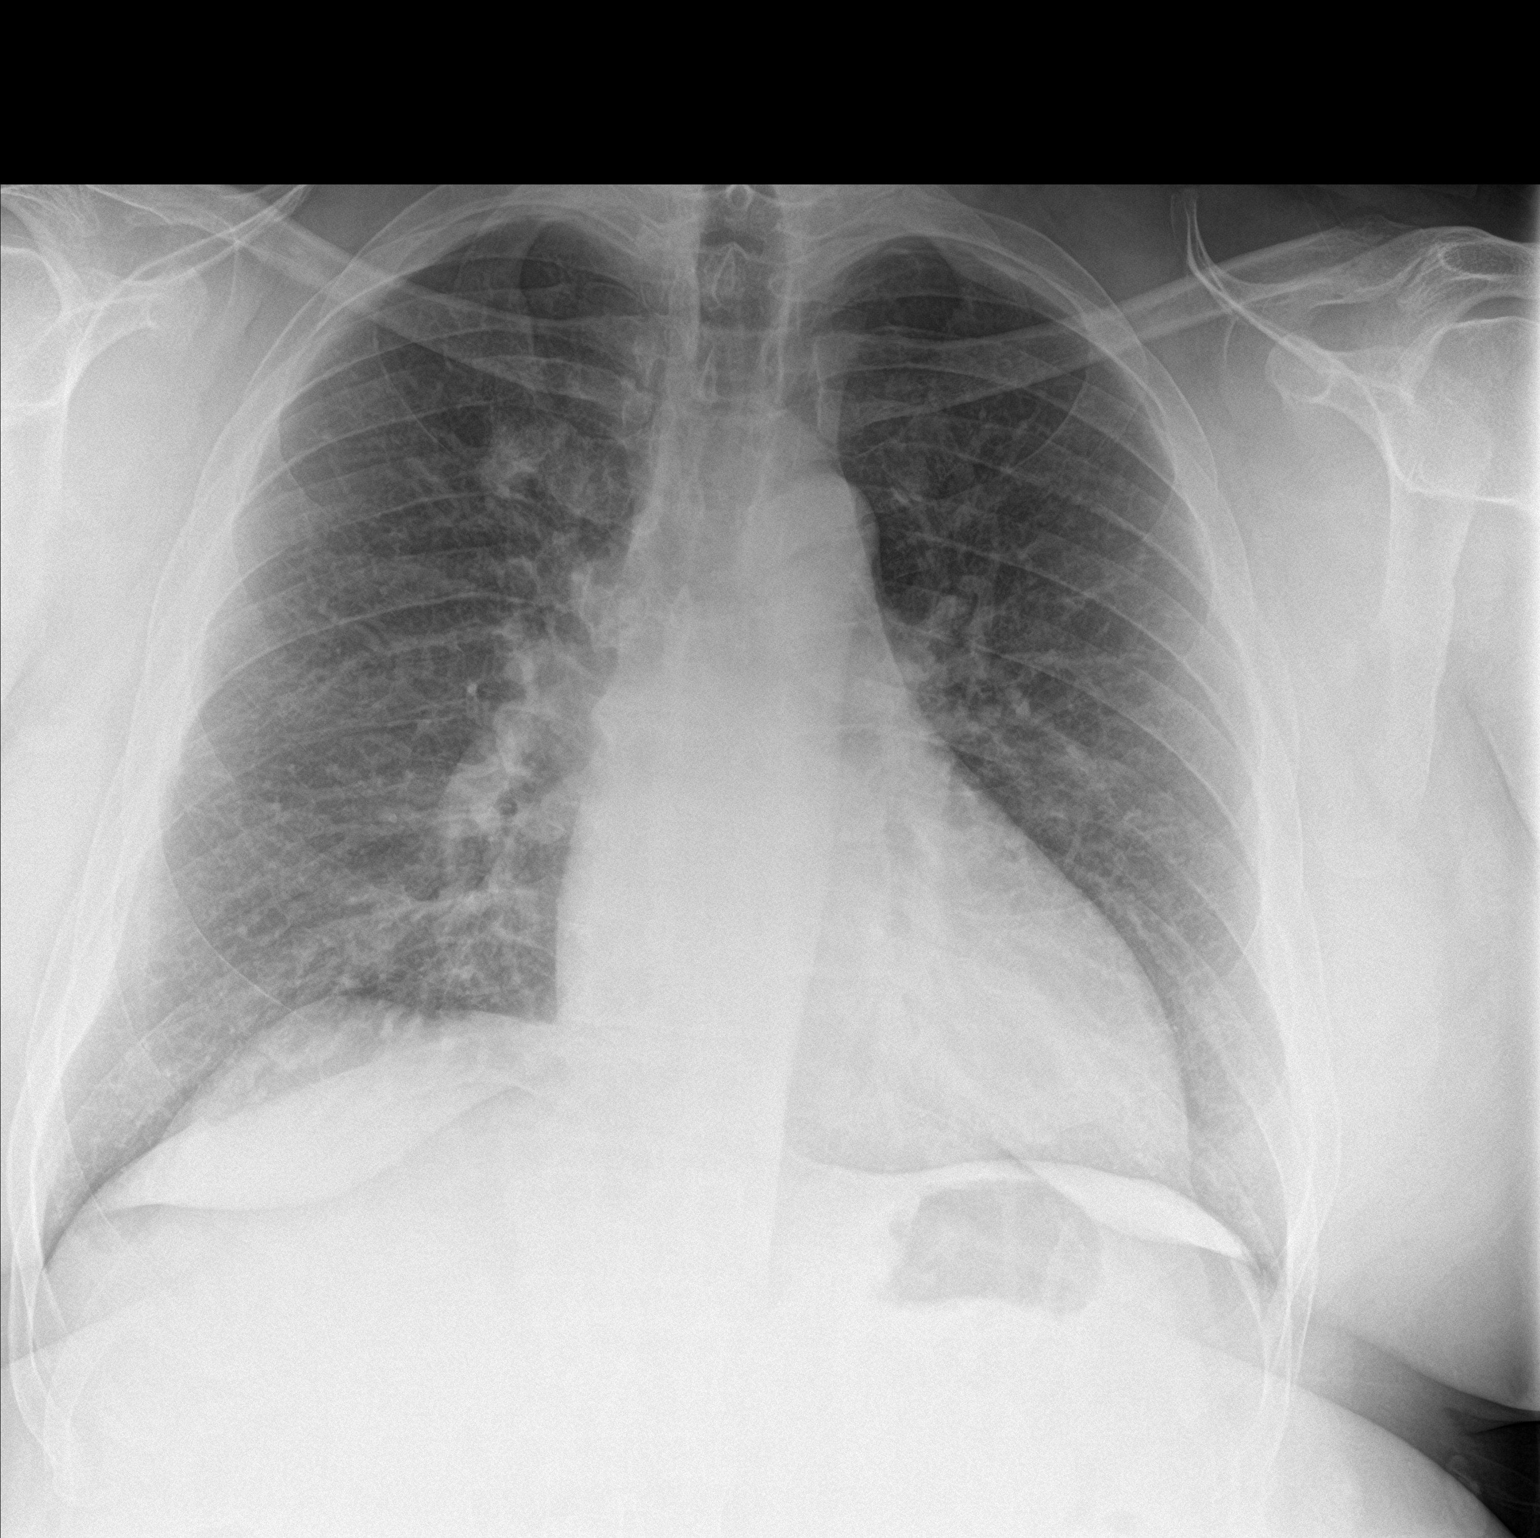

[chest lat]
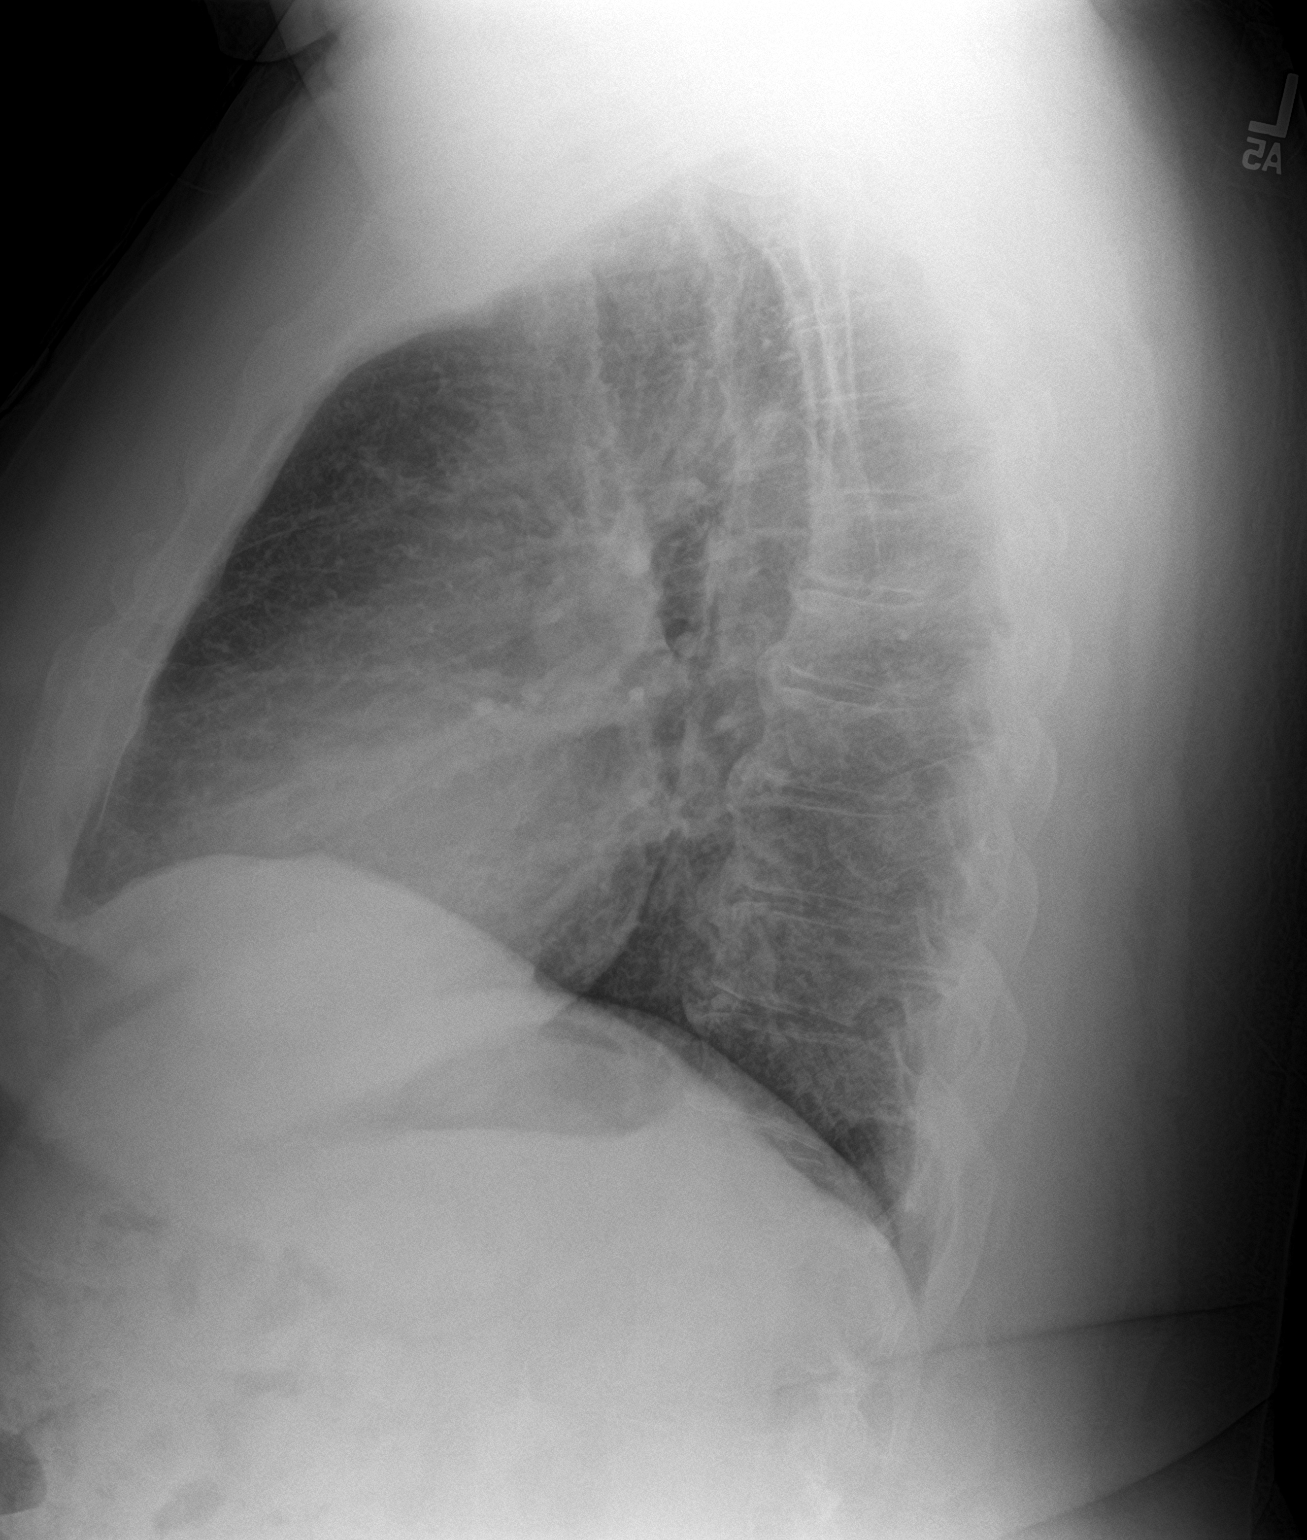

[2 of 2 positions shown; findings below may reference images not displayed]

FINDINGS: Heart size mildly enlarged. Mediastinal shadows are normal. The
lungs are clear. Chronic density relating to the right first rib
end. The vascularity is normal. No effusions. Ordinary degenerative
changes affect the spine.
IMPRESSION: No active disease.  Mild cardiac enlargement.

## 2018-12-19 NOTE — Telephone Encounter (Signed)
Was not aware that patient was to increase dose If so how much will he need to make [facility] aware of this PA approved for 200 units

## 2018-12-19 NOTE — Telephone Encounter (Signed)
Patient called regarding his Botox and [facility]. He had mentioned that the Dosage was being increased? He said [facility] would like to know from the office once it has been received. Thanks

## 2018-12-19 NOTE — Telephone Encounter (Signed)
He n/s his last visit, where I was supposed to see him post botox.  I won't have any idea about his dosing without having seen him post botox.

## 2018-12-20 NOTE — Telephone Encounter (Signed)
Spoke with patient he is unable to make the 8:15am on Thursday. He will keep Botox appt in Nov.

## 2018-12-20 NOTE — Telephone Encounter (Signed)
Called spoke with patient he states that he has a follow up appt in Sept he needed to R/S which he did to 12/30/18 he was told to cancel because it was to close to his next botox treatment. He states that he notice some improvement with the injection but feels that it needs to be increased. Pt states [facility] has ship he old dose to the office.

## 2018-12-20 NOTE — Telephone Encounter (Signed)
He n/s on 9/4.  He did try to r/s that but it was too close to next appt.  See if he can come in Thursday at 8:15 and we can discuss his botox and how he did last time

## 2018-12-21 ENCOUNTER — Telehealth: Payer: Self-pay

## 2018-12-21 NOTE — Telephone Encounter (Signed)
Received call after hours from [facility] at [facility] to schedule shipment for patient Botox  [phone] 12/20/18 @ 11:57am  Called back spoke with [facility] shipment scheduled for Friday 12/23/18. They tried calling patient 2 times for consent to deliver no answer. Voice mail was left for patient to call them back to give consent.  Unable to deliver botox until patient gives consent.

## 2018-12-28 NOTE — Progress Notes (Signed)
Received hard fax from [facility] for patient Botox  Ref# [id number] Status: APPROVED Medication: Botox 200 units #2 Pt can get total on 400 units if needed  Approved through 03/30/2019     Approvedtoday Request Reference Number: [id number]. BOTOX INJ 200UNIT is approved through 03/30/2019. For further questions, call [phone].

## 2018-12-30 ENCOUNTER — Ambulatory Visit: Payer: Medicare Other | Admitting: Neurology

## 2019-01-12 ENCOUNTER — Telehealth: Payer: Self-pay | Admitting: Neurology

## 2019-01-12 NOTE — Telephone Encounter (Signed)
See 10/14 phone call.  Did patient give consent for shipment of his botox and was it received?

## 2019-01-13 ENCOUNTER — Other Ambulatory Visit: Payer: Self-pay

## 2019-01-13 ENCOUNTER — Ambulatory Visit (INDEPENDENT_AMBULATORY_CARE_PROVIDER_SITE_OTHER): Payer: Medicare Other | Admitting: Neurology

## 2019-01-13 DIAGNOSIS — G243 Spasmodic torticollis: Secondary | ICD-10-CM

## 2019-01-13 DIAGNOSIS — G244 Idiopathic orofacial dystonia: Secondary | ICD-10-CM | POA: Diagnosis not present

## 2019-01-13 MED ORDER — ONABOTULINUMTOXINA 100 UNITS IJ SOLR
225.0000 [IU] | Freq: Once | INTRAMUSCULAR | Status: AC
  Administered 2019-01-13: 225 [IU] via INTRAMUSCULAR

## 2019-01-13 NOTE — Telephone Encounter (Signed)
See below

## 2019-01-13 NOTE — Procedures (Signed)
Botulinum Clinic   Procedure Note Botox  Attending: Dr. Wells Guiles Tat  Preoperative Diagnosis(es): Cervical Dystonia; meige syndrome  Result History  Onset of effect: 3 weeks Duration of Benefit: unknown but didn't last for long - n/s f/u visit Adverse Effects: none  Consent obtained from: The patient Benefits discussed included, but were not limited to decreased muscle tightness, increased joint range of motion, and decreased pain.  Risk discussed included, but were not limited pain and discomfort, bleeding, bruising, excessive weakness, venous thrombosis, muscle atrophy and dysphagia.  A copy of the patient medication guide was given to the patient which explains the blackbox warning.  Patients identity and treatment sites confirmed Yes.  .  Details of Procedure: Skin was cleaned with alcohol.  A 30 gauge, 56mm  needle was introduced to the target muscle.   Prior to injection, the needle plunger was aspirated to make sure the needle was not within a blood vessel.  There was no blood retrieved on aspiration.    Following is a summary of the muscles injected  And the amount of Botulinum toxin used:   Dilution 0.9% preservative free saline mixed with 100 u Botox type A to make 10 U per 0.1cc  Injections  Location Left  Right Units Number of sites        Sternocleidomastoid  60 60 1  Splenius Capitus,       Masseter 30 30 60   platysma 2.5 x 6 2.5 x 6 30 12   Trapezius  20/20/10 50 3        TOTAL UNITS:   200    Agent: Botulinum Type A ( Onobotulinum Toxin type A ).  2 vials of Botox were used, each containing 100 units and freshly diluted with 1 mL of sterile, non-preserved saline   Total injected (Units): 200 (needs higher dose but not approved by insurance - did apply the dose differently than last visit)  Total wasted (Units): none wasted   Pt tolerated procedure well without complications.   Reinjection is anticipated in 3 months.

## 2019-01-13 NOTE — Addendum Note (Signed)
Addended by: Ranae Plumber on: 01/13/2019 03:56 PM   Modules accepted: Orders

## 2019-01-13 NOTE — Telephone Encounter (Signed)
Pt has 200 units of Botox in fridge for appt today that was ship from pharmacy

## 2019-02-09 ENCOUNTER — Other Ambulatory Visit: Payer: Self-pay | Admitting: Neurology

## 2019-02-09 ENCOUNTER — Telehealth: Payer: Self-pay | Admitting: Neurology

## 2019-02-09 NOTE — Telephone Encounter (Signed)
Patient is needing refill on the clonazepam medication to the pharm on file. He said he called the pharm and he was out of refills. He will be out of medication tomorrow. Thanks!

## 2019-02-09 NOTE — Telephone Encounter (Signed)
PDMP is reviewed.  Pt has been receiving his klonopin from other sources (primary care).  Therefore, I cannot refill it.  It is a controlled substance and should not/can not be given from multiple different providers.  He has been getting it fromPCP for a while.  He will need to call his PCP

## 2019-02-10 NOTE — Telephone Encounter (Signed)
Spoke with patient and advised him to have PCP fill clonazepam.

## 2019-03-01 ENCOUNTER — Other Ambulatory Visit: Payer: Self-pay

## 2019-03-01 ENCOUNTER — Ambulatory Visit: Payer: Medicare Other | Admitting: Cardiology

## 2019-03-01 ENCOUNTER — Telehealth: Payer: Self-pay | Admitting: Radiology

## 2019-03-01 ENCOUNTER — Encounter: Payer: Self-pay | Admitting: Cardiology

## 2019-03-01 VITALS — BP 138/72 | HR 66 | Ht 71.0 in | Wt 323.0 lb

## 2019-03-01 DIAGNOSIS — I483 Typical atrial flutter: Secondary | ICD-10-CM

## 2019-03-01 DIAGNOSIS — R002 Palpitations: Secondary | ICD-10-CM

## 2019-03-01 NOTE — Patient Instructions (Addendum)
Medication Instructions:  Your physician has recommended you make the following change in your medication:  1. STOP Eliquis  * If you need a refill on your cardiac medications before your next appointment, please call your pharmacy.   Labwork: None ordered  Testing/Procedures: Your physician has recommended that you wear a 2 week holter monitor. Holter monitors are medical devices that record the heart's electrical activity. Doctors most often use these monitors to diagnose arrhythmias. Arrhythmias are problems with the speed or rhythm of the heartbeat. The monitor is a small, portable device. You can wear one while you do your normal daily activities. This is usually used to diagnose what is causing palpitations/syncope (passing out).  Follow-Up: Follow up to be determined after monitor has been completed and reviewed by Dr Curt Bears.   Thank you for choosing [facility]!!   Trinidad Curet, RN [phone]  Any Other Special Instructions Will Be Listed Below (If Applicable).   Ambulatory Cardiac Monitoring An ambulatory cardiac monitor is a small recording device that is used to detect abnormal heart rhythms (arrhythmias). Most monitors are connected by wires to flat, sticky disks (electrodes) that are then attached to your chest. You may need to wear a monitor if you have had symptoms such as:  Fast heartbeats (palpitations).  Dizziness.  Fainting or light-headedness.  Unexplained weakness.  Shortness of breath. There are several types of monitors. Some common monitors include:  Holter monitor. This records your heart rhythm continuously, usually for 24-48 hours.  Event (episodic) monitor. This monitor has a symptoms button, and when pushed, it will begin recording. You need to activate this monitor to record when you have a heart-related symptom.  Automatic detection monitor. This monitor will begin recording when it detects an abnormal heartbeat. What are the  risks? Generally, these devices are safe to use. However, it is possible that the skin under the electrodes will become irritated. How to prepare for monitoring Your health care provider will prepare your chest for the electrode placement and show you how to use the monitor.  Do not apply lotions to your chest before monitoring.  Follow directions on how to care for the monitor, and how to return the monitor when the testing period is complete. How to use your cardiac monitor  Follow directions about how long to wear the monitor, and if you can take the monitor off in order to shower or bathe. ? Do not let the monitor get wet. ? Do not bathe, swim, or use a hot tub while wearing the monitor.  Keep your skin clean. Do not put body lotion or moisturizer on your chest.  Change the electrodes as told by your health care provider, or any time they stop sticking to your skin. You may need to use medical tape to keep them on.  Try to put the electrodes in slightly different places on your chest to help prevent skin irritation. Follow directions from your health care provider about where to place the electrodes.  Make sure the monitor is safely clipped to your clothing or in a location close to your body as recommended by your health care provider.  If your monitor has a symptoms button, press the button to mark an event as soon as you feel a heart-related symptom, such as: ? Dizziness. ? Weakness. ? Light-headedness. ? Palpitations. ? Thumping or pounding in your chest. ? Shortness of breath. ? Unexplained weakness.  Keep a diary of your activities, such as walking, doing chores, and taking  medicine. It is very important to note what you were doing when you pushed the button to record your symptoms. This will help your health care provider determine what might be contributing to your symptoms.  Send the recorded information as recommended by your health care provider. It may take some time  for your health care provider to process the results.  Change the batteries as told by your health care provider.  Keep electronic devices away from your monitor. These include: ? Tablets. ? MP3 players. ? Cell phones.  While wearing your monitor you should avoid: ? Electric blankets. ? Firefighter. ? Electric toothbrushes. ? Microwave ovens. ? Magnets. ? Metal detectors. Get help right away if:  You have chest pain.  You have shortness of breath or extreme difficulty breathing.  You develop a very fast heartbeat that does not get better.  You develop dizziness that does not go away.  You faint or constantly feel like you are about to faint. Summary  An ambulatory cardiac monitor is a small recording device that is used to detect abnormal heart rhythms (arrhythmias).  Make sure you understand how to send the information from the monitor to your health care provider.  It is important to press the button on the monitor when you have any heart-related symptoms.  Keep a diary of your activities, such as walking, doing chores, and taking medicine. It is very important to note what you were doing when you pushed the button to record your symptoms. This will help your health care provider learn what might be causing your symptoms. This information is not intended to replace advice given to you by your health care provider. Make sure you discuss any questions you have with your health care provider. Document Released: 12/03/2007 Document Revised: 02/05/2017 Document Reviewed: 02/08/2016 Elsevier Patient Education  2020 [employer].

## 2019-03-01 NOTE — Telephone Encounter (Signed)
Enrolled patient for a 14 day Zio monitor to be mailed to patients home.  

## 2019-03-01 NOTE — Progress Notes (Signed)
Electrophysiology Office Note   Date:  03/01/2019   ID:  Isaac Valdez, DOB May 03, 1960, MRN [id number]  PCP:  Einar Pheasant, DO  Cardiologist:  Gwenlyn Found Primary Electrophysiologist:  Isaac Benton Meredith Leeds, MD    No chief complaint on file.    History of Present Illness: Isaac Valdez is a 58 y.o. male who is being seen today for the evaluation of atrial flutter at the request of Isaac Valdez. Presenting today for electrophysiology evaluation.  He has a history of CHF, diabetes, hypertension, hyperlipidemia.  He has a 30-pack-year history, quit smoking 5 years ago.  He also has typical atrial flutter.  Had atrial flutter ablation 04/21/17.  Today, denies symptoms of palpitations, chest pain, shortness of breath, orthopnea, PND, lower extremity edema, claudication, dizziness, presyncope, syncope, bleeding, or neurologic sequela. The patient is tolerating medications without difficulties.  Overall he is doing well.  He has no chest pain or shortness of breath.  He has noted intermittent palpitations.  Palpitations occur once every few weeks.  No exacerbating or alleviating factors.  They last from minutes to an hour.   Past Medical History:  Diagnosis Date  . Back pain   . CHF (congestive heart failure) ([place])   . Diabetes mellitus   . Dyspnea   . Gout   . High cholesterol   . Hypertension    Past Surgical History:  Procedure Laterality Date  . A-FLUTTER ABLATION N/A 04/21/2017   Procedure: A-FLUTTER ABLATION;  Surgeon: Constance Haw, MD;  Location: [place] [facility];  Service: Cardiovascular;  Laterality: N/A;  . BACK SURGERY    . MANDIBLE FRACTURE SURGERY       Current Outpatient Medications  Medication Sig Dispense Refill  . carvedilol (COREG) 6.25 MG tablet Take 1 tablet (6.25 mg total) by mouth 2 (two) times daily. 60 tablet 3  . clonazePAM (KLONOPIN) 0.5 MG tablet Take 1 tablet (0.5 mg total) by mouth 3 (three) times daily. 90 tablet 1  . famotidine  (PEPCID) 20 MG tablet Take 20 mg by mouth 2 (two) times daily.    Marland Kitchen glipiZIDE (GLUCOTROL) 5 MG tablet Take 5 mg by mouth daily as needed (CBG >120).     . torsemide (DEMADEX) 20 MG tablet Take 20 mg by mouth at bedtime.    Marland Kitchen atorvastatin (LIPITOR) 80 MG tablet Take 80 mg by mouth at bedtime.    Marland Kitchen BOTOX 100 units SOLR injection Inject 100 Units into the muscle every 3 (three) months.    Marland Kitchen lisinopril (ZESTRIL) 10 MG tablet Take 10 mg by mouth daily.     No current facility-administered medications for this visit.    Allergies:   Aspirin   Social History:  The patient  reports that he has quit smoking. He smoked 0.50 packs per day. He has never used smokeless tobacco. He reports previous alcohol use. He reports that he does not use drugs.   Family History:  The patient's family history includes Heart disease in his mother; Heart failure in his son; Hypertension in his sister; Other in his father.    ROS:  Please see the history of present illness.   Otherwise, review of systems is positive for none.   All other systems are reviewed and negative.   PHYSICAL EXAM: VS:  BP 138/72   Pulse 66   Ht 5\' 11"  (1.803 m)   Wt (!) 323 lb (146.5 kg)   SpO2 97%   BMI 45.05 kg/m  , BMI Body mass index  is 45.05 kg/m. GEN: Well nourished, well developed, in no acute distress  HEENT: normal  Neck: no JVD, carotid bruits, or masses Cardiac: RRR; no murmurs, rubs, or gallops,no edema  Respiratory:  clear to auscultation bilaterally, normal work of breathing GI: soft, nontender, nondistended, + BS MS: no deformity or atrophy  Skin: warm and dry Neuro:  Strength and sensation are intact Psych: euthymic mood, full affect  EKG:  EKG is ordered today. Personal review of the ekg ordered shows sinus rhythm, PVCs  Recent Labs: No results found for requested labs within last 8760 hours.    Lipid Panel     Component Value Date/Time   CHOL 215 (H) 03/05/2016 0348   TRIG 241 (H) 03/05/2016 0348   HDL  58 03/05/2016 0348   CHOLHDL 3.7 03/05/2016 0348   VLDL 48 (H) 03/05/2016 0348   LDLCALC 109 (H) 03/05/2016 0348     Wt Readings from Last 3 Encounters:  03/01/19 (!) 323 lb (146.5 kg)  11/10/18 (!) 310 lb (140.6 kg)  09/19/18 (!) 320 lb 12.8 oz (145.5 kg)      Other studies Reviewed: Additional studies/ records that were reviewed today include: TTE 02/19/17  Review of the above records today demonstrates:  - Left ventricle: The cavity size was normal. Wall thickness was   increased in a pattern of mild LVH. Systolic function was mildly   reduced. The estimated ejection fraction was in the range of 45%   to 50%. - Aortic valve: Mildly calcified annulus. There was mild   regurgitation.   ASSESSMENT AND PLAN:  1.  Typical atrial flutter: Status post ablation 04/21/2017.  Currently off of Eliquis.  CHA2DS2-VASc of 2.  He is continuing to have episodic palpitations.  We Pierson Vantol thus fit him with a 2-week monitor.  2.  Hypertension: Mildly elevated today but usually well controlled.  No changes.  3.  Hyperlipidemia: Followed by primary care.  4.  Chronic systolic heart failure: Currently on optimal medical therapy.  No changes.  5.  T wave inversions: New since ablation but without current chest pain.  Continuing to monitor.  Current medicines are reviewed at length with the patient today.   The patient does not have concerns regarding his medicines.  The following changes were made today: None  Labs/ tests ordered today include:  Orders Placed This Encounter  Procedures  . LONG TERM MONITOR (3-14 DAYS)  . EKG 12-Lead     Disposition:   FU with Geovannie Vilar 12 months  Signed, Narciso Stoutenburg Jorja Loa, MD  03/01/2019 3:54 PM     [facility] [address] [place] [address] [phone] (office) [phone] (fax)

## 2019-03-07 ENCOUNTER — Other Ambulatory Visit (INDEPENDENT_AMBULATORY_CARE_PROVIDER_SITE_OTHER): Payer: Medicare Other

## 2019-03-07 DIAGNOSIS — R002 Palpitations: Secondary | ICD-10-CM | POA: Diagnosis not present

## 2019-03-07 DIAGNOSIS — I483 Typical atrial flutter: Secondary | ICD-10-CM

## 2019-03-13 NOTE — Progress Notes (Signed)
Isaac Valdez was seen today in follow up for craniocervical dystonia and meige.  Pt is currently on klonopin, 0.5 mg tid.  He takes benadryl with each of these dosages.  He thinks that the combo helps.  He doesn't have EDS with the benadryl.  Last botox was on 11/6.  He reports that he didn't get much help with that.  He uses bipap.  He thinks that this may make it worse.  When he first wakes up in the AM, his movements are good but they are worse as the day goes on.  Asks me about back pain.  Used to get injections and wants another.  Radiates down his legs.    Prior:  amantadine, benadryl, xenazine for only 2 weeks because of cost, valium, ativan; insurance denied ingrezza    ALLERGIES:   Allergies  Allergen Reactions  . Aspirin Other (See Comments) and Anaphylaxis    "indigestion" "indigestion" Other reaction(s): Other (See Comments), Other (See Comments) "indigestion" "indigestion"    CURRENT MEDICATIONS:  Outpatient Encounter Medications as of 03/14/2019  Medication Sig  . atorvastatin (LIPITOR) 80 MG tablet Take 80 mg by mouth at bedtime.  Marland Kitchen BOTOX 100 units SOLR injection Inject 100 Units into the muscle every 3 (three) months.  . carvedilol (COREG) 6.25 MG tablet Take 1 tablet (6.25 mg total) by mouth 2 (two) times daily.  . clonazePAM (KLONOPIN) 0.5 MG tablet Take 1 tablet (0.5 mg total) by mouth 3 (three) times daily.  . famotidine (PEPCID) 20 MG tablet Take 20 mg by mouth 2 (two) times daily.  Marland Kitchen glipiZIDE (GLUCOTROL) 5 MG tablet Take 5 mg by mouth daily as needed (CBG >120).   Marland Kitchen lisinopril (ZESTRIL) 10 MG tablet Take 10 mg by mouth daily.  Marland Kitchen torsemide (DEMADEX) 20 MG tablet Take 20 mg by mouth at bedtime.   No facility-administered encounter medications on file as of 03/14/2019.    PAST MEDICAL HISTORY:   Past Medical History:  Diagnosis Date  . Back pain   . CHF (congestive heart failure) ([place])   . Diabetes mellitus   . Dyspnea   . Gout   . High cholesterol     . Hypertension     PAST SURGICAL HISTORY:   Past Surgical History:  Procedure Laterality Date  . A-FLUTTER ABLATION N/A 04/21/2017   Procedure: A-FLUTTER ABLATION;  Surgeon: Constance Haw, MD;  Location: [place] [facility];  Service: Cardiovascular;  Laterality: N/A;  . BACK SURGERY    . MANDIBLE FRACTURE SURGERY      SOCIAL HISTORY:   Social History   Socioeconomic History  . Marital status: Married    Spouse name: Not on file  . Number of children: 2  . Years of education: 25  . Highest education level: 11th grade  Occupational History  . Occupation: part time truck Geophysicist/field seismologist  . Occupation: on disability  Tobacco Use  . Smoking status: Former Smoker    Packs/day: 0.50  . Smokeless tobacco: Never Used  Substance and Sexual Activity  . Alcohol use: Not Currently    Comment: rarely  . Drug use: No  . Sexual activity: Yes    Birth control/protection: None  Other Topics Concern  . Not on file  Social History Narrative   Lives with wife in a one story home.  Has 2 children.     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     Education: 11th grade.  Right handed   Social Determinants of Health   Financial Resource Strain:   . Difficulty of Paying Living Expenses: Not on file  Food Insecurity:   . Worried About Programme researcher, broadcasting/film/video in the Last Year: Not on file  . Ran Out of Food in the Last Year: Not on file  Transportation Needs:   . Lack of Transportation (Medical): Not on file  . Lack of Transportation (Non-Medical): Not on file  Physical Activity:   . Days of Exercise per Week: Not on file  . Minutes of Exercise per Session: Not on file  Stress:   . Feeling of Stress : Not on file  Social Connections:   . Frequency of Communication with Friends and Family: Not on file  . Frequency of Social Gatherings with Friends and Family: Not on file  . Attends Religious Services: Not on file  . Active Member of Clubs or Organizations: Not on file  .  Attends Banker Meetings: Not on file  . Marital Status: Not on file  Intimate Partner Violence:   . Fear of Current or Ex-Partner: Not on file  . Emotionally Abused: Not on file  . Physically Abused: Not on file  . Sexually Abused: Not on file    FAMILY HISTORY:   Family Status  Relation Name Status  . Mother  Deceased  . Sister  Alive  . Father  Deceased       hypothermia  . MGM  Deceased  . MGF  Deceased  . PGM  Deceased  . PGF  Deceased  . Son  Deceased  . Daughter  Alive  . Son  Alive    PHYSICAL EXAMINATION:    VITALS:   Vitals:   03/14/19 1324  BP: (!) 145/79  Pulse: 71  SpO2: 98%  Weight: (!) 325 lb 4 oz (147.5 kg)  Height: 5\' 11"  (1.803 m)    GEN:  The patient appears stated age and is in NAD. HEENT:  Normocephalic, atraumatic.  The mucous membranes are moist. The superficial temporal arteries are without ropiness or tenderness.   Neurological examination:  Orientation: The patient is alert and oriented x3. Cranial nerves: There is good facial symmetry.The speech is fluent and clear. Soft palate rises symmetrically and there is no tongue deviation. Hearing is intact to conversational tone. Sensation: Sensation is intact to light touch throughout Motor: Strength is at least antigravity x4.  Movement examination: Tone: There is normal tone in the UE/LE Abnormal movements:  He has dyskinetic movements of the R arm. Gait and Station: The patient has no difficulty arising out of a deep-seated chair without the use of the hands. The patient's stride length is good.     ASSESSMENT/PLAN:  1.  Meige syndrome with associated craniocervical dystonia  -Continue clonazepam 0.5 mg 3 times per day.  R/B/SE were discussed.  The opportunity to ask questions was given and they were answered to the best of my ability.  The patient expressed understanding and willingness to follow the outlined treatment protocols.  -We will continue the Botox.  He does need  a higher dose.  Probably need to add left levator scapulae and increase the dosage to the R trap significantly.    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  He did see [facility] regarding this, but they did not notice the jaw opening  dystonia, but they only saw him on video and I suspect that they just did not see him well.  -discussed DBS but not sure how helpful this would be  -may [facility] to [facility] if next injections not that helpful  -discussed austedo but even if we could get it approved (and I have some samples), it would be unaffordable for him and not worth it (same as ingrezza was)  2.  LBP  -he asked me if I do back injections.  Told him this is out of my field.  He did ask me for physician recommendations.  Gave him name to Dr. Wynn Banker  Total time spent on today's visit was greater than 35 minutes, including both face-to-face time and nonface-to-face time.  Time included that spent on review of records (prior notes available to me/labs/imaging if pertinent), discussing treatment and goals, answering patient's questions and coordinating care.  Cc:  Si Gaul, DO

## 2019-03-14 ENCOUNTER — Other Ambulatory Visit: Payer: Self-pay

## 2019-03-14 ENCOUNTER — Ambulatory Visit: Payer: Medicare Other | Admitting: Neurology

## 2019-03-14 ENCOUNTER — Encounter: Payer: Self-pay | Admitting: Neurology

## 2019-03-14 VITALS — BP 145/79 | HR 71 | Ht 71.0 in | Wt 325.2 lb

## 2019-03-14 DIAGNOSIS — G244 Idiopathic orofacial dystonia: Secondary | ICD-10-CM

## 2019-03-14 DIAGNOSIS — G243 Spasmodic torticollis: Secondary | ICD-10-CM

## 2019-03-14 NOTE — Patient Instructions (Addendum)
Dr. Claudette Laws [phone] [address], [place], [place] [address]

## 2019-03-29 ENCOUNTER — Telehealth: Payer: Self-pay | Admitting: Neurology

## 2019-03-29 NOTE — Telephone Encounter (Signed)
Spoke with wife and all she wanted was to verify that the botox would be increased and per appointment in Jan it will.   -We will continue the Botox.  He does need a higher dose.  Probably need to add left levator scapulae and increase the dosage to the R trap significantly.

## 2019-03-29 NOTE — Telephone Encounter (Signed)
Wife left msg with after hours about wanting to know if DR was going to up her husband's medication-he is having involuntary muscle movements and medication is not helping. More frequent. botox not working. Doesn't know name of medication. Thanks!

## 2019-04-04 ENCOUNTER — Telehealth: Payer: Self-pay | Admitting: Neurology

## 2019-04-04 NOTE — Telephone Encounter (Signed)
Pharm is calling in about PA for the botox 100 units before dispensing. Thanks!

## 2019-04-05 ENCOUNTER — Encounter: Payer: Self-pay | Admitting: *Deleted

## 2019-04-05 NOTE — Progress Notes (Signed)
The prescription is good for 3 more refills but the PA expires 07/04/2019 will need new PA then.  Recommend usin covermymeds which was super fast today.

## 2019-04-05 NOTE — Progress Notes (Signed)
Rebecca Tat [address] [place], [place] [address] Hours of Operations: Address: 5 a.m. - 10 p.m. PT, Monday-Friday [id number] 6 a.m. - 3 p.m. PT, Saturday [place], [place] [address] Date: 04/05/2019 To: Lurena Joiner Tat From: [facility] Phone: [phone] Phone: [phone] Fax: [phone] Reference #: [id number] RE: Prior Authorization Request Patient Name: Isaac Valdez Patient DOB: 01/06/1961 Patient ID: [id number] Status of Request: Approve Medication Name: Botox Inj 100unit GPI/NDC: [id number] Decision Notes: BOTOX INJ 100UNIT, use as directed, is approved through 07/04/2019 under your Medicare Part D benefit. Reviewed by: System If the treating physician would like to discuss this coverage decision with the physician or health care professional reviewer, please call [facility] Prior Authorization department at [phone].

## 2019-04-05 NOTE — Progress Notes (Addendum)
Called [phone] [facility] and spoke with pharmacist Mohammed to give new updated prescription of 300 units Botox and he expired the previous script of 200 units of Botox. I then completed the Covermymeds request that their pharmacy had initiated for prior authorization. Once we receive a reply from [facility] via covermymeds we will proceed to get the medication shipped here for his injection. It was favorable valid through 07/04/2019 so I called [phone] and set up shipment.  3 100unit vials are to arrive 04/07/2019

## 2019-04-20 ENCOUNTER — Encounter: Payer: Self-pay | Admitting: Internal Medicine

## 2019-04-21 ENCOUNTER — Telehealth: Payer: Self-pay | Admitting: *Deleted

## 2019-04-21 ENCOUNTER — Other Ambulatory Visit: Payer: Self-pay

## 2019-04-21 ENCOUNTER — Ambulatory Visit: Payer: Medicare Other | Admitting: Neurology

## 2019-04-21 DIAGNOSIS — G243 Spasmodic torticollis: Secondary | ICD-10-CM

## 2019-04-21 DIAGNOSIS — G244 Idiopathic orofacial dystonia: Secondary | ICD-10-CM

## 2019-04-21 MED ORDER — ONABOTULINUMTOXINA 100 UNITS IJ SOLR
295.0000 [IU] | Freq: Once | INTRAMUSCULAR | Status: AC
  Administered 2019-04-21: 295 [IU] via INTRAMUSCULAR

## 2019-04-21 MED ORDER — CARVEDILOL 12.5 MG PO TABS: 12.5000 mg | ORAL_TABLET | Freq: Two times a day (BID) | ORAL | 11 refills | Status: DC

## 2019-04-21 NOTE — Telephone Encounter (Signed)
Informed patient of results and verbal understanding expressed.  

## 2019-04-21 NOTE — Telephone Encounter (Signed)
-----   Message from Isaac Jorja Loa, MD sent at 04/10/2019 11:32 AM EST ----- In clinic interrogation reviewed. Battery and lead parameters stable. Palpitations possibly due to PVCs. Increase coreg to 12.5mg .

## 2019-04-21 NOTE — Procedures (Signed)
[facility]   Procedure Note Botox  Attending: Dr. Lurena Joiner Jaimon Bugaj  Preoperative Diagnosis(es): Cervical Dystonia; meige syndrome  Result History  Onset of effect: 3 weeks Duration of Benefit: unknown but didn't last for long - n/s f/u visit Adverse Effects: none  Consent obtained from: The patient Benefits discussed included, but were not limited to decreased muscle tightness, increased joint range of motion, and decreased pain.  Risk discussed included, but were not limited pain and discomfort, bleeding, bruising, excessive weakness, venous thrombosis, muscle atrophy and dysphagia.  A copy of the patient medication guide was given to the patient which explains the blackbox warning.  Patients identity and treatment sites confirmed Yes.  .  Details of Procedure: Skin was cleaned with alcohol.  A 30 gauge, 24mm  needle was introduced to the target muscle.   Prior to injection, the needle plunger was aspirated to make sure the needle was not within a blood vessel.  There was no blood retrieved on aspiration.    Following is a summary of the muscles injected  And the amount of Botulinum toxin used:   Dilution 0.9% preservative free saline mixed with 100 u Botox type A to make 10 U per 0.1cc  Injections  Location Left  Right Units Number of sites        Sternocleidomastoid  40 40 1  Left levator scapulae 20  20   Masseter 30 30 60   platysma 2.5 x 6 2.5 x 6 30 12   Trapezius  30/20/20/20 90 3  deltoid  20/20 40   TOTAL UNITS:       Agent: Botulinum Type A ( Onobotulinum Toxin type A ).  2 vials of Botox were used, each containing 100 units and freshly diluted with 1 mL of sterile, non-preserved saline (except platsyma and that was diluted with 46ml of sterile saline per 100 units)   Total injected (Units): 280  Total wasted: 15 units   Pt tolerated procedure well without complications.   Reinjection is anticipated in 3 months.

## 2019-05-16 ENCOUNTER — Encounter: Payer: Self-pay | Admitting: Internal Medicine

## 2019-05-26 ENCOUNTER — Telehealth: Payer: Self-pay | Admitting: Neurology

## 2019-05-26 NOTE — Telephone Encounter (Signed)
-----   Message from Octaviano Batty Davinity Fanara, DO sent at 04/21/2019  1:57 PM EST ----- Call patient and see how did with last botox

## 2019-05-26 NOTE — Telephone Encounter (Signed)
Patient states he can tell a little difference but not much.  Patient wants to know if he needs to follow up with you again for another botox injection?

## 2019-05-26 NOTE — Telephone Encounter (Signed)
No.  Cancel that botox.  Send referral to Dr. Vergie Living but the patient can call that office as well as he is an established patient at that office.  He is at [facility]

## 2019-05-26 NOTE — Telephone Encounter (Signed)
Spoke with patient and gave him Dr tat's recommendations. He blew his breath and wanted to know why he needed to go back to him and how he felt like he is going around in circles.   Gave him Dr Buckmire's phone number and advised him to contact their office. He requested I send over our notes so Dr Vergie Living will have them before his appt. Informed patient that i would send over his notes. He voiced understanding.

## 2019-05-26 NOTE — Telephone Encounter (Signed)
Please call patient and see if he did better with the last botox or not?  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

## 2019-05-29 ENCOUNTER — Telehealth: Payer: Self-pay | Admitting: Neurology

## 2019-05-29 NOTE — Telephone Encounter (Signed)
See 05/26/19 phone encounter.  The referral diagnosis was in there.  Is there another question?

## 2019-05-29 NOTE — Telephone Encounter (Signed)
Patient called with questions about the referral to an ENT in [facility]. He said they seem to be confused about the reason for the referral.

## 2019-05-30 NOTE — Telephone Encounter (Signed)
I'm not attempting to bounce him around at all.  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 Vergie Living does.  Dr. 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  Dr. Vergie Living is one of the few in the state who does that.

## 2019-05-30 NOTE — Telephone Encounter (Signed)
Patient wants to know why he can not get another botox injection since it was working a little bit. He wants to know why he needs to go back to Dr Buckmire's office. He said the last visit he had with Dr Vergie Living he stated nothing was wrong with is jaw and told him to go back to neurology. Patient  wants to know why he is being bounced around instead of getting another Botox injection.  He states Dr Vergie Living told him they could not help him with his voiced and does not know why we are sending him back there. I explained to patient that I did not put on my referral anything about his voice. The note stated jaw pain.

## 2019-05-30 NOTE — Telephone Encounter (Signed)
Patient voiced understanding and thanked me for clarifying everything and what I'm saying makes since. Patient states he will contact Dr Buckmire's office to schedule his follow up appt .   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 Advised the office to contact our office with any questions or concerns.

## 2019-06-06 ENCOUNTER — Encounter: Payer: Self-pay | Admitting: *Deleted

## 2019-06-06 NOTE — Progress Notes (Addendum)
Renewed the PA which is to expire 07/04/19 and his next injection date is 07/21/2019. It is mail order from [employer] office notes and will discontinue this PA due to being sent to different MD

## 2019-07-12 ENCOUNTER — Telehealth: Payer: Self-pay | Admitting: Neurology

## 2019-07-12 NOTE — Telephone Encounter (Signed)
I had not gotten the notes but was able to retrieve them from care everywhere.  He suggested xyrem.  That unfortunately is not a drug that I prescribe or have ever prescribed.  Let me see if I can get in touch with the [facility] movement physician he saw and see if she does that.

## 2019-07-12 NOTE — Telephone Encounter (Signed)
I've sent her an email but Dr. Atha Starks note indicated that he contacted her directly as well so the patient could reach out directly to Dr. Artis Flock office as well [facility] neurology), as he has seen her before.  I will let him know if I hear back from her as well

## 2019-07-12 NOTE — Telephone Encounter (Signed)
Ok! I will wait to hear back from you before I contact the patient.

## 2019-07-12 NOTE — Telephone Encounter (Signed)
Patient states that he went to the provider in [place] last week and they were to get in touch with our office about trying a new medication for him. He is calling to check on the status of that please call

## 2019-07-12 NOTE — Telephone Encounter (Signed)
Spoke with patient and he states the neurologist was supposed to reach out to Dr Tat that can relax peoples body. He states he doesn't prescribes the medication. He states he can not remember the name of the medication. He wants to know if we have heard from Dr Vergie Living in [place]. He says if Dr Tat does not prescribe the medication he will have to follow up with Dr Buckmire's office again.

## 2019-07-13 NOTE — Telephone Encounter (Signed)
I'm sorry he is frustrated and sorry that he felt that I could prescribe that medication.  Even if I wanted to, that medication is so highly controlled by the government that only certain prescribers can prescribe it and I am not one of them.  I talked with the neurologists at [facility] and none of the movement d/o neurologists prescribe it either.  I feel quite certain that his insurance wouldn't pay for it for his indication, which is part of the problem he has had with other meds.  Re: botox, if he wants to restart it here, I don't have any problem with that.  We stopped b/c he wasn't finding it that effective I thought.  If he wants to restart, have Darl Pikes confirm Berkley Harvey is still good.

## 2019-07-13 NOTE — Telephone Encounter (Signed)
Patient states he was told Dr Tat was going to prescribe that medication. He is frustrating and wants to know if he can restart Botox.   I advise patient that once we hear back from Dr Carmelina Peal we will contact him.

## 2019-07-13 NOTE — Telephone Encounter (Signed)
Spoke with patient and gave him Dr Don Perking recommendations. He wanted Dr Tat to call Dr Buckmire's office to see what the plan is for him since he will not be getting that medicine. I advised him to contact Dr Glorianne Manchester office directly and find out what the plan is. He voiced understanding. He also stated he wanted to continue botox. But I told him to reach out to the other office first then go from there. He voiced understanding.

## 2019-07-21 ENCOUNTER — Ambulatory Visit: Payer: Medicare Other | Admitting: Neurology

## 2019-07-24 ENCOUNTER — Telehealth: Payer: Self-pay

## 2019-07-24 ENCOUNTER — Telehealth: Payer: Self-pay | Admitting: Neurology

## 2019-07-24 NOTE — Telephone Encounter (Signed)
Spoke with patient and he wants to know if Dr Tat has heard back from Dr Buckmire's office. He states he does not want to do Botox until he hears back from Dr Buckmire's office. He states if Dr Vergie Living will do his Botox he rather have it done there. He states he called there office and left a message for his nurse to call him back. I advised him to wait until he hears back from Dr Buckmire's nurse to see what they advise him to do. And if they are not going to do his Botox then we ca . He voiced understanding.   Dr Tat made aware.  Message sent to [facility] for Botox PA.

## 2019-07-24 NOTE — Telephone Encounter (Signed)
Does he need to be set up for Botox?

## 2019-07-24 NOTE — Telephone Encounter (Signed)
See 5/6 phone call.  I am happy to resume his botox but only can do what we previously were doing.  Darl Pikes will need to prior auth.

## 2019-07-24 NOTE — Telephone Encounter (Signed)
Patient called in wanting to get the latest on his Botox. Requesting a call back.

## 2019-07-24 NOTE — Telephone Encounter (Signed)
Contacted [employer] to see if another Prior Authorization was needed. The rep stated the PA expired on 07/04/2019 and another request will need to be completed.   The Authorization number is [id number].

## 2019-07-26 ENCOUNTER — Encounter: Payer: Self-pay | Admitting: *Deleted

## 2019-07-26 NOTE — Progress Notes (Addendum)
[employer]  Benefit Verification [id number] Submitted!  Waiting for reply  Specialty Pharmacies: [facility] [phone] Called and they require a PA so a new one was submitted  [phone] and [phone]  Pre-D Optional - Supporting Doc Required   PA Phone Number  [phone] Payer Website [email or web address] Additional PA Instructions Please allow 14 days for processing.   Gerasimos Haskin Key: [id number] help? Call us at [phone] Status Sent to [employer] Drug Botox 100UNIT solution Form [employer] Medicare Part D Electronic Prior Authorization Form (2017 NCPDP)

## 2019-07-27 NOTE — Progress Notes (Addendum)
[name] Key: [id number] - PA Case ID: [id number] Need help? Call us at [phone] Outcome Approvedtoday Request Reference Number: [id number]. BOTOX INJ 100UNIT is approved through 10/27/2019. Your patient may now fill this prescription and it will be covered. Drug Botox 100UNIT solution Form [employer] Medicare Part D Electronic Prior Authorization Form (2017 NCPDP)

## 2019-08-09 ENCOUNTER — Telehealth: Payer: Self-pay | Admitting: Neurology

## 2019-08-09 NOTE — Telephone Encounter (Signed)
-----   Message from Rosezena Sensor sent at 08/09/2019 12:12 PM EDT ----- Regarding: RE: Botox approved Patient wants to hold off at this time I finally got a hold of him  ----- Message ----- From: Vallarie Mare Sent: 07/27/2019   1:02 PM EDT To: Octaviano Batty Sariya Trickey, DO, Rosezena Sensor Subject: Botox approved                                 Annabelle Harman, His Botox was approved and scheduled to be delivered here next week. They will call him to approved delivery and for his copay. Will you please set up an appt for him with Dr. Arbutus Leas for Botox. If he does not want one then he can cancel the delivery when they call and let you know he is not getting it. Dr. Arbutus Leas, Here is where we are with his Botox.

## 2019-08-09 NOTE — Telephone Encounter (Signed)
Just a note to document that pt declined to schedule botox

## 2019-08-12 ENCOUNTER — Emergency Department (HOSPITAL_BASED_OUTPATIENT_CLINIC_OR_DEPARTMENT_OTHER): Payer: Medicare Other

## 2019-08-12 ENCOUNTER — Other Ambulatory Visit: Payer: Self-pay

## 2019-08-12 ENCOUNTER — Encounter (HOSPITAL_BASED_OUTPATIENT_CLINIC_OR_DEPARTMENT_OTHER): Payer: Self-pay | Admitting: Emergency Medicine

## 2019-08-12 ENCOUNTER — Emergency Department (HOSPITAL_BASED_OUTPATIENT_CLINIC_OR_DEPARTMENT_OTHER)
Admission: EM | Admit: 2019-08-12 | Discharge: 2019-08-12 | Disposition: A | Discharge status: Home / Self Care | Payer: Medicare Other | Attending: Emergency Medicine | Admitting: Emergency Medicine

## 2019-08-12 DIAGNOSIS — I509 Heart failure, unspecified: Secondary | ICD-10-CM | POA: Insufficient documentation

## 2019-08-12 DIAGNOSIS — N182 Chronic kidney disease, stage 2 (mild): Secondary | ICD-10-CM | POA: Diagnosis not present

## 2019-08-12 DIAGNOSIS — Z87891 Personal history of nicotine dependence: Secondary | ICD-10-CM | POA: Insufficient documentation

## 2019-08-12 DIAGNOSIS — E1151 Type 2 diabetes mellitus with diabetic peripheral angiopathy without gangrene: Secondary | ICD-10-CM | POA: Insufficient documentation

## 2019-08-12 DIAGNOSIS — M549 Dorsalgia, unspecified: Secondary | ICD-10-CM | POA: Diagnosis present

## 2019-08-12 DIAGNOSIS — Z7984 Long term (current) use of oral hypoglycemic drugs: Secondary | ICD-10-CM | POA: Insufficient documentation

## 2019-08-12 DIAGNOSIS — E1122 Type 2 diabetes mellitus with diabetic chronic kidney disease: Secondary | ICD-10-CM | POA: Insufficient documentation

## 2019-08-12 DIAGNOSIS — M5442 Lumbago with sciatica, left side: Secondary | ICD-10-CM | POA: Diagnosis not present

## 2019-08-12 DIAGNOSIS — I130 Hypertensive heart and chronic kidney disease with heart failure and stage 1 through stage 4 chronic kidney disease, or unspecified chronic kidney disease: Secondary | ICD-10-CM | POA: Insufficient documentation

## 2019-08-12 MED ORDER — HYDROMORPHONE HCL 1 MG/ML IJ SOLN
0.5000 mg | Freq: Once | INTRAMUSCULAR | Status: AC
  Administered 2019-08-12: 0.5 mg via INTRAVENOUS
  Filled 2019-08-12: qty 1

## 2019-08-12 MED ORDER — MORPHINE SULFATE (PF) 4 MG/ML IV SOLN
4.0000 mg | Freq: Once | INTRAVENOUS | Status: AC
  Administered 2019-08-12: 4 mg via INTRAVENOUS
  Filled 2019-08-12: qty 1

## 2019-08-12 MED ORDER — DEXAMETHASONE SODIUM PHOSPHATE 10 MG/ML IJ SOLN
10.0000 mg | Freq: Once | INTRAMUSCULAR | Status: AC
  Administered 2019-08-12: 10 mg via INTRAMUSCULAR
  Filled 2019-08-12: qty 1

## 2019-08-12 MED ORDER — HYDROMORPHONE HCL 1 MG/ML IJ SOLN
0.5000 mg | Freq: Once | INTRAMUSCULAR | Status: AC
  Administered 2019-08-12: 0.5 mg via INTRAMUSCULAR
  Filled 2019-08-12: qty 1

## 2019-08-12 MED ORDER — OXYCODONE-ACETAMINOPHEN 5-325 MG PO TABS: 1.0000 | ORAL_TABLET | Freq: Three times a day (TID) | ORAL | 0 refills | Status: DC | PRN

## 2019-08-12 MED ORDER — METHOCARBAMOL 500 MG PO TABS
500.0000 mg | ORAL_TABLET | Freq: Once | ORAL | Status: AC
  Administered 2019-08-12: 500 mg via ORAL
  Filled 2019-08-12: qty 1

## 2019-08-12 NOTE — ED Notes (Signed)
IV access was lost, patient has to be stuck again before he can receive more pain meds.  Will re-eval in 20 min to see if he can tolerate imaging.

## 2019-08-12 NOTE — ED Notes (Signed)
Attempted imaging but pt could not tolerate lying on xray table.  meds to be administered, then try again in 20 min.

## 2019-08-12 NOTE — ED Provider Notes (Signed)
[facility] EMERGENCY DEPARTMENT Provider Note   CSN: [id number] Arrival date & time: 08/12/19  1449     History Chief Complaint  Patient presents with  . Back Pain    Isaac Valdez is a 59 y.o. male past medical history of chronic back pain, CHF, diabetes, gout, hypertension who presents for evaluation of left-sided back pain that radiates into his left lower extremity.  He reports that this is been ongoing for the last 3 days.  He has had issues with his back for several years.  He reports that he sees an orthopedic doctor who had been giving him epidural injections.  He states that they were no longer working so he was referred to a pain management doctor.  He reports that he had been having some improvement in pain but over the last 3 days, his pain returned.  He states it is mostly in the left side that radiates down his left lower extremity.  He reports he occasionally will get sharp pains on his left lower extremity.  His pain management doctor prescribed improved late which he states he has been taking for a few days but does not feel like he has any improvement.  He denies any preceding trauma, injury, fall.  He has been able to ambulate but does report worsening pain with ambulation.  He has a history of a laminectomy several years ago.  He has not followed up with a neurosurgeon since then.  He denies any Donnell pain, nausea/vomiting, urinary complaints. Denies fevers, weight loss, numbness/weakness of upper and lower extremities, bowel/bladder incontinence, saddle anesthesia, history of back surgery, history of IVDA.   The history is provided by the patient.       Past Medical History:  Diagnosis Date  . Back pain   . CHF (congestive heart failure) (HCC)   . Diabetes mellitus   . Dyspnea   . Gout   . High cholesterol   . Hypertension     Patient Active Problem List   Diagnosis Date Noted  . Metabolic syndrome 01/25/2018  . Left arm pain 08/16/2017  .  Dyskinesia 06/24/2017  . GERD (gastroesophageal reflux disease) 03/19/2017  . 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 Gout 03/19/2017  . Hyperkalemia 03/19/2017  . Leukocytosis 03/19/2017  . Acute renal failure (HCC)   . Moderate to severe mitral regurgitation 10/06/2016  . Hypertension 08/10/2016  . Acute gout due to renal impairment involving left knee 08/10/2016  . AKI (acute kidney injury) (HCC) 08/09/2016  . Alcohol use 08/09/2016  . History of gastric ulcer 08/09/2016  . Medically noncompliant 08/09/2016  . Hypertensive urgency 03/05/2016  . Chest pain 03/05/2016  . Chronic systolic heart failure (HCC) 03/05/2016  . Typical atrial flutter (HCC)   . Atrial flutter (HCC) 03/04/2016  . Lumbosacral radiculopathy 11/21/2015  . Trimalleolar fracture of right ankle, closed, initial encounter 04/08/2015  . Hypercholesterolemia 01/18/2015  . Benign essential hypertension 01/18/2015  . Cardiomyopathy (HCC) 01/18/2015  . 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 Morbid obesity due to excess calories (HCC) 01/03/2015  . Hypokalemia 01/03/2015  . Shortness of breath 01/02/2015  . Acute exacerbation of congestive heart failure (HCC) 01/02/2015  . Chronic kidney disease, stage II (mild) 01/02/2015  . H/O medication noncompliance 01/02/2015  . Obesity 01/02/2015  . Obstructive sleep apnea 01/02/2015  . Atrial flutter with rapid ventricular response (HCC) 01/02/2015  . Acute sinusitis 05/09/2013  . 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) 05/09/2013  .  Temporomandibular joint disorder 05/09/2013  . Type II diabetes mellitus with peripheral circulatory disorder, uncontrolled (HCC) 05/09/2013  . Type II diabetes mellitus with neurological manifestations (HCC) 05/09/2013  . Vitamin D deficiency 02/07/2013  . Back pain 01/27/2012  . Hand pain 01/27/2012  . Wrist pain 01/27/2012    Past Surgical History:  Procedure Laterality Date    . A-FLUTTER ABLATION N/A 04/21/2017   Procedure: A-FLUTTER ABLATION;  Surgeon: Regan Lemming, MD;  Location: [facility] INVASIVE CV LAB;  Service: Cardiovascular;  Laterality: N/A;  . BACK SURGERY    . MANDIBLE FRACTURE SURGERY         Family History  Problem Relation Age of Onset  . Heart disease Mother   . Hypertension Sister   . Other Father        hypothermia  . Heart failure Son     Social History   Tobacco Use  . Smoking status: Former Smoker    Packs/day: 0.50  . Smokeless tobacco: Never Used  Substance Use Topics  . Alcohol use: Not Currently    Comment: rarely  . Drug use: No    Home Medications Prior to Admission medications   Medication Sig Start Date End Date Taking? Authorizing Provider  atorvastatin (LIPITOR) 80 MG tablet Take 80 mg by mouth at bedtime. 12/10/18   [provider]  BOTOX 100 units SOLR injection Inject 100 Units into the muscle every 3 (three) months. 09/27/18   [provider]  carvedilol (COREG) 12.5 MG tablet Take 1 tablet (12.5 mg total) by mouth 2 (two) times daily. Dose increase 04/21/19   Camnitz, Andree Coss, MD  clonazePAM (KLONOPIN) 0.5 MG tablet Take 1 tablet (0.5 mg total) by mouth 3 (three) times daily. 04/19/18   Tat, Octaviano Batty, DO  famotidine (PEPCID) 20 MG tablet Take 20 mg by mouth 2 (two) times daily.    [provider]  glipiZIDE (GLUCOTROL) 5 MG tablet Take 5 mg by mouth daily as needed (CBG >120).  08/12/16   [provider]  lisinopril (ZESTRIL) 10 MG tablet Take 10 mg by mouth daily. 02/12/19   [provider]  oxyCODONE-acetaminophen (PERCOCET/ROXICET) 5-325 MG tablet Take 1-2 tablets by mouth every 8 (eight) hours as needed for severe pain. 08/12/19   Maxwell Caul, PA-C  torsemide (DEMADEX) 20 MG tablet Take 20 mg by mouth at bedtime.    [provider]    Allergies    Aspirin  Review of Systems   Review of Systems  Constitutional: Negative for fever.   Gastrointestinal: Negative for abdominal pain, nausea and vomiting.  Genitourinary: Negative for dysuria and hematuria.  Musculoskeletal: Positive for back pain. Negative for neck pain.  Neurological: Negative for weakness and numbness.  All other systems reviewed and are negative.   Physical Exam Updated Vital Signs BP (!) 197/92 (BP Location: Right Arm)   Pulse 79   Temp 98 F (36.7 C) (Oral)   Resp 19   Ht 5\' 11"  (1.803 m)   Wt (!) 140.6 kg   SpO2 97%   BMI 43.24 kg/m   Physical Exam Vitals and nursing note reviewed.  Constitutional:      Appearance: He is well-developed.  HENT:     Head: Normocephalic and atraumatic.  Eyes:     General: No scleral icterus.       Right eye: No discharge.        Left eye: No discharge.     Conjunctiva/sclera: Conjunctivae normal.  Neck:  Comments: Full flexion/extension and lateral movement of neck fully intact. No bony midline tenderness. No deformities or crepitus.  Cardiovascular:     Pulses:          Dorsalis pedis pulses are 2+ on the right side and 2+ on the left side.  Pulmonary:     Effort: Pulmonary effort is normal.  Musculoskeletal:     Comments: No midline T-spine tenderness.  Tenderness palpation noted to the lower lumbar region that extends into the left paraspinal muscles into the gluteal region.  No deformity or crepitus noted.  Muscular tenderness noted to the left gluteal area that extends in the muscle area of the hip.  No bony hip deformity or crepitus noted.  Skin:    General: Skin is warm and dry.     Comments: Good distal cap refill.  LLE is not dusky in appearance or cool to touch.  Neurological:     Mental Status: He is alert.     Comments: Follows commands, Moves all extremities  5/5 strength to BUE and BLE  Sensation intact throughout all major nerve distributions Positive SLR on the LLE.   Psychiatric:        Speech: Speech normal.        Behavior: Behavior normal.     ED Results / Procedures /  Treatments   Labs (all labs ordered are listed, but only abnormal results are displayed) Labs Reviewed - No data to display  EKG None  Radiology No results found.  Procedures Procedures (including critical care time)  Medications Ordered in ED Medications  dexamethasone (DECADRON) injection 10 mg (10 mg Intramuscular Given 08/12/19 1620)  HYDROmorphone (DILAUDID) injection 0.5 mg (0.5 mg Intramuscular Given 08/12/19 1621)  methocarbamol (ROBAXIN) tablet 500 mg (500 mg Oral Given 08/12/19 1620)  morphine 4 MG/ML injection 4 mg (4 mg Intravenous Given 08/12/19 1744)  HYDROmorphone (DILAUDID) injection 0.5 mg (0.5 mg Intravenous Given 08/12/19 1916)    ED Course  I have reviewed the triage vital signs and the nursing notes.  Pertinent labs & imaging results that were available during my care of the patient were reviewed by me and considered in my medical decision making (see chart for details).    MDM Rules/Calculators/A&P                      59 year old male who presents for evaluation of lower back pain/left hip pain that has been ongoing for the last 3 days.  He has history of chronic back pain noise has some degree of back pain.  He has been seen by orthopedics and been getting epidural injections which he states has lost his efficacy.  He was referred to pain management which he saw and was started on Prolia.  He comes in today because he feels like his back pain is worsened.  He states that he has had some mild improvement but over the last 3 days, flared up again.  He states it did on his left and radiates into his left hip and down his leg.  No preceding trauma, injury.  He still been able to ambulate but does report pain with ambulation.  No red flags, neuro deficits.  On initially arrival, he is afebrile nontoxic-appearing.  Vital signs are stable.  No neuro deficits noted on exam.  He has tenderness palpation noted to the left paraspinal muscle of the back that extends into the gluteal  muscle with positive straight leg raise.  Consider sciatica versus  musculoskeletal versus chronic back pain.  History/physical exam not concerning for GU etiology, or attic dissection.  History/physical exam not concerning for cauda equina, spinal abscess.  We will plan for analgesics and reassess.  Reevaluation after pain medication.  Patient reports no improvement in pain.  He reports that he has had some success with IV morphine whenever he has this pain before.  Will give additional analgesics.  He has no overlying warmth or erythema of the hip that would concerning for infectious etiology.  But he feels like some of the pain is going into his hip more.  We will plan for x-ray.  RN informing that patient was unable to tolerate x-ray given continued pain.  We will try additional analgesics.  Patient still having pain.  We discussed treatment options here in the emergency department.  At this time, patient has no neurological deficits and is able to ambulate.  I discussed with him regarding making pain tolerable versus completely taking away his pain.  I discussed with him that there may be some chronic component to this pain that may be difficult to can fully control here in the emergency department.  At this time, do not feel that patient needs admission as do not suspect acute spinal cord abnormality.  Discussed with Dr. Tamera Punt who is agreeable to plan.  RN inform me that IV access was lost.  Patient states he is ready to go home.  He is still having pain.  He is requesting something for pain at home.  I discussed with him that given his pain management visit, this could notify his pain management contract.  Patient understands and still wishes to have pain medication.  Will give short course of pain medication. At this time, patient exhibits no emergent life-threatening condition that require further evaluation in ED or admission. Patient had ample opportunity for questions and discussion. All patient's  questions were answered with full understanding. Strict return precautions discussed. Patient expresses understanding and agreement to plan.   Portions of this note were generated with [employer]. Dictation errors may occur despite best attempts at proofreading.   Final Clinical Impression(s) / ED Diagnoses Final diagnoses:  Left-sided low back pain with sciatica, sciatica laterality unspecified, unspecified chronicity    Rx / DC Orders ED Discharge Orders         Ordered    oxyCODONE-acetaminophen (PERCOCET/ROXICET) 5-325 MG tablet  Every 8 hours PRN     08/12/19 2000           Volanda Napoleon, PA-C 08/14/19 0013    Malvin Johns, MD 08/26/19 [phone]

## 2019-08-12 NOTE — ED Notes (Signed)
Patient reports no change in pain and that he let the provider know so he could gets scripts and leave.

## 2019-08-12 NOTE — Discharge Instructions (Signed)
Take pain medications as directed for break through pain. Do not drive or operate machinery while taking this medication.   Follow up with the referred neurosurgeon.   Return to the Emergency Department immediately for any worsening back pain, neck pain, difficulty walking, numbness/weaknss of your arms or legs, urinary or bowel accidents, fever or any other worsening or concerning symptoms.

## 2019-08-12 NOTE — ED Triage Notes (Signed)
L low back pain radiating into L hip and leg. Denies injury.

## 2019-08-12 NOTE — ED Notes (Signed)
Pain meds have not given relief yet; something new to be given then imaging might be reattempted

## 2019-08-30 ENCOUNTER — Telehealth: Payer: Self-pay

## 2019-08-30 ENCOUNTER — Telehealth: Payer: Self-pay | Admitting: Neurology

## 2019-08-30 NOTE — Telephone Encounter (Signed)
Patient notified and frustrated but voiced understanding.

## 2019-08-30 NOTE — Telephone Encounter (Signed)
Contacted  [employer] ,patients insurance, to request they mail Botox to our office. The representative stated they would need to contact patient to get consent and discuss patients copay.  The representative placed me on hold to reach out to patient. She wanted to speak to patient to get consent, review copay, and discuss outstanding balance on patients account. When the patient answered the telephone he stated his wife handles his financial and personal matters. The rep then tried to contact the wife, but the wife stated for her to contact the patient because she was unaware of what was going on.    The rep stated that they will not mail out the Botox until they speak with the patient. She requested I contact the patient to make him aware that they will not proceed with sending his Botox until they speak with him.

## 2019-08-30 NOTE — Telephone Encounter (Signed)
AccessNurse 08/29/19 4:48pm:  "Caller states he needs to talk with someone from the office. States he has been waiting for a call regarding when he needs to come in for his Botox."

## 2019-08-30 NOTE — Telephone Encounter (Signed)
Spoke with patient and he said his insurance called him this morning. Informed him of what needed to be done in order to proceed with Botox. He voiced understanding.   He then asked if Dr Tat could prescribe the medication his ENT suggested. I explained to patient that Dr Tat could not prescribe the medication. I advised patient that Dr Tat recommends the provider that recommend proscribe it or suggest something else.  He states he had a test that was painful that consists of poking in his neck. He requested I ask Dr Tat if she could refer him to someone that could prescribe it.   Spoke with Dr Tat and she states the provider who suggested the medication should refer the patient elsewhere.

## 2019-08-31 NOTE — Telephone Encounter (Signed)
Issues have been addressed see other phone message.

## 2019-09-13 ENCOUNTER — Telehealth: Payer: Self-pay

## 2019-09-13 NOTE — Telephone Encounter (Signed)
Received fax from [employer] stating they have made several attempts to reach patient but they were all unsuccessful. They were contacting patient about his Botox.

## 2020-01-20 ENCOUNTER — Other Ambulatory Visit: Payer: Self-pay

## 2020-01-20 ENCOUNTER — Emergency Department (HOSPITAL_BASED_OUTPATIENT_CLINIC_OR_DEPARTMENT_OTHER)
Admission: EM | Admit: 2020-01-20 | Discharge: 2020-01-20 | Disposition: A | Discharge status: Home / Self Care | Payer: Medicare Other | Attending: Emergency Medicine | Admitting: Emergency Medicine

## 2020-01-20 ENCOUNTER — Emergency Department (HOSPITAL_BASED_OUTPATIENT_CLINIC_OR_DEPARTMENT_OTHER): Payer: Medicare Other

## 2020-01-20 ENCOUNTER — Encounter (HOSPITAL_BASED_OUTPATIENT_CLINIC_OR_DEPARTMENT_OTHER): Payer: Self-pay | Admitting: Emergency Medicine

## 2020-01-20 DIAGNOSIS — Y9301 Activity, walking, marching and hiking: Secondary | ICD-10-CM | POA: Insufficient documentation

## 2020-01-20 DIAGNOSIS — Z79899 Other long term (current) drug therapy: Secondary | ICD-10-CM | POA: Diagnosis not present

## 2020-01-20 DIAGNOSIS — Z87891 Personal history of nicotine dependence: Secondary | ICD-10-CM | POA: Diagnosis not present

## 2020-01-20 DIAGNOSIS — N182 Chronic kidney disease, stage 2 (mild): Secondary | ICD-10-CM | POA: Insufficient documentation

## 2020-01-20 DIAGNOSIS — I5022 Chronic systolic (congestive) heart failure: Secondary | ICD-10-CM | POA: Diagnosis not present

## 2020-01-20 DIAGNOSIS — S46911A Strain of unspecified muscle, fascia and tendon at shoulder and upper arm level, right arm, initial encounter: Secondary | ICD-10-CM

## 2020-01-20 DIAGNOSIS — Y92007 Garden or yard of unspecified non-institutional (private) residence as the place of occurrence of the external cause: Secondary | ICD-10-CM | POA: Diagnosis not present

## 2020-01-20 DIAGNOSIS — W010XXA Fall on same level from slipping, tripping and stumbling without subsequent striking against object, initial encounter: Secondary | ICD-10-CM | POA: Diagnosis not present

## 2020-01-20 DIAGNOSIS — S4991XA Unspecified injury of right shoulder and upper arm, initial encounter: Secondary | ICD-10-CM | POA: Diagnosis present

## 2020-01-20 DIAGNOSIS — I130 Hypertensive heart and chronic kidney disease with heart failure and stage 1 through stage 4 chronic kidney disease, or unspecified chronic kidney disease: Secondary | ICD-10-CM | POA: Insufficient documentation

## 2020-01-20 DIAGNOSIS — E1149 Type 2 diabetes mellitus with other diabetic neurological complication: Secondary | ICD-10-CM | POA: Diagnosis not present

## 2020-01-20 MED ORDER — HYDROCODONE-ACETAMINOPHEN 5-325 MG PO TABS: 1.0000 | ORAL_TABLET | ORAL | 0 refills | Status: DC | PRN

## 2020-01-20 MED ORDER — KETOROLAC TROMETHAMINE 30 MG/ML IJ SOLN
30.0000 mg | Freq: Once | INTRAMUSCULAR | Status: AC
  Administered 2020-01-20: 30 mg via INTRAMUSCULAR
  Filled 2020-01-20: qty 1

## 2020-01-20 NOTE — ED Provider Notes (Signed)
[facility] EMERGENCY DEPARTMENT Provider Note   CSN: [id number] Arrival date & time: 01/20/20  1017     History Chief Complaint  Patient presents with  . Shoulder Injury    Isaac Valdez is a 59 y.o. male.  Pt presents to the ED today with right shoulder pain.  Pt said he was walking his dog last night and tripped and fell.  He has pain to his right shoulder when it moves.  It just throbs when he is still.  He denies any other pain.        Past Medical History:  Diagnosis Date  . Back pain   . CHF (congestive heart failure) (HCC)   . Diabetes mellitus   . Dyspnea   . Gout   . High cholesterol   . Hypertension     Patient Active Problem List   Diagnosis Date Noted  . Metabolic syndrome 01/25/2018  . Left arm pain 08/16/2017  . Dyskinesia 06/24/2017  . GERD (gastroesophageal reflux disease) 03/19/2017  . 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 Gout 03/19/2017  . Hyperkalemia 03/19/2017  . Leukocytosis 03/19/2017  . Acute renal failure (HCC)   . Moderate to severe mitral regurgitation 10/06/2016  . Hypertension 08/10/2016  . Acute gout due to renal impairment involving left knee 08/10/2016  . AKI (acute kidney injury) (HCC) 08/09/2016  . Alcohol use 08/09/2016  . History of gastric ulcer 08/09/2016  . Medically noncompliant 08/09/2016  . Hypertensive urgency 03/05/2016  . Chest pain 03/05/2016  . Chronic systolic heart failure (HCC) 03/05/2016  . Typical atrial flutter (HCC)   . Atrial flutter (HCC) 03/04/2016  . Lumbosacral radiculopathy 11/21/2015  . Trimalleolar fracture of right ankle, closed, initial encounter 04/08/2015  . Hypercholesterolemia 01/18/2015  . Benign essential hypertension 01/18/2015  . Cardiomyopathy (HCC) 01/18/2015  . 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 Morbid obesity due to excess calories (HCC) 01/03/2015  . Hypokalemia 01/03/2015  . Shortness of breath 01/02/2015  . Acute  exacerbation of congestive heart failure (HCC) 01/02/2015  . Chronic kidney disease, stage II (mild) 01/02/2015  . H/O medication noncompliance 01/02/2015  . Obesity 01/02/2015  . Obstructive sleep apnea 01/02/2015  . Atrial flutter with rapid ventricular response (HCC) 01/02/2015  . Acute sinusitis 05/09/2013  . 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) 05/09/2013  . Temporomandibular joint disorder 05/09/2013  . Type II diabetes mellitus with peripheral circulatory disorder, uncontrolled (HCC) 05/09/2013  . Type II diabetes mellitus with neurological manifestations (HCC) 05/09/2013  . Vitamin D deficiency 02/07/2013  . Back pain 01/27/2012  . Hand pain 01/27/2012  . Wrist pain 01/27/2012    Past Surgical History:  Procedure Laterality Date  . A-FLUTTER ABLATION N/A 04/21/2017   Procedure: A-FLUTTER ABLATION;  Surgeon: Regan Lemming, MD;  Location: [facility];  Service: Cardiovascular;  Laterality: N/A;  . BACK SURGERY    . MANDIBLE FRACTURE SURGERY         Family History  Problem Relation Age of Onset  . Heart disease Mother   . Hypertension Sister   . Other Father        hypothermia  . Heart failure Son     Social History   Tobacco Use  . Smoking status: Former Smoker    Packs/day: 0.50  . Smokeless tobacco: Never Used  Vaping Use  . Vaping Use: Never used  Substance Use Topics  . Alcohol use: Not Currently    Comment: rarely  .  Drug use: No    Home Medications Prior to Admission medications   Medication Sig Start Date End Date Taking? Authorizing Provider  atorvastatin (LIPITOR) 80 MG tablet Take 80 mg by mouth at bedtime. 12/10/18   [provider]  BOTOX 100 units SOLR injection Inject 100 Units into the muscle every 3 (three) months. 09/27/18   [provider]  carvedilol (COREG) 12.5 MG tablet Take 1 tablet (12.5 mg total) by mouth 2 (two) times daily. Dose increase 04/21/19   Camnitz, Andree Coss, MD    clonazePAM (KLONOPIN) 0.5 MG tablet Take 1 tablet (0.5 mg total) by mouth 3 (three) times daily. 04/19/18   Tat, Octaviano Batty, DO  famotidine (PEPCID) 20 MG tablet Take 20 mg by mouth 2 (two) times daily.    [provider]  glipiZIDE (GLUCOTROL) 5 MG tablet Take 5 mg by mouth daily as needed (CBG >120).  08/12/16   [provider]  HYDROcodone-acetaminophen (NORCO/VICODIN) 5-325 MG tablet Take 1 tablet by mouth every 4 (four) hours as needed. 01/20/20   Jacalyn Lefevre, MD  lisinopril (ZESTRIL) 10 MG tablet Take 10 mg by mouth daily. 02/12/19   [provider]  oxyCODONE-acetaminophen (PERCOCET/ROXICET) 5-325 MG tablet Take 1-2 tablets by mouth every 8 (eight) hours as needed for severe pain. 08/12/19   Maxwell Caul, PA-C  torsemide (DEMADEX) 20 MG tablet Take 20 mg by mouth at bedtime.    [provider]    Allergies    Aspirin  Review of Systems   Review of Systems  Musculoskeletal:       Right shoulder pain  All other systems reviewed and are negative.   Physical Exam Updated Vital Signs BP (!) 196/90   Pulse 66   Temp 98.4 F (36.9 C) (Oral)   Resp 18   Ht 5\' 11"  (1.803 m)   Wt (!) 142.9 kg   SpO2 100%   BMI 43.93 kg/m   Physical Exam Vitals and nursing note reviewed.  Constitutional:      Appearance: Normal appearance.  HENT:     Head: Normocephalic and atraumatic.     Right Ear: External ear normal.     Left Ear: External ear normal.     Nose: Nose normal.     Mouth/Throat:     Mouth: Mucous membranes are moist.     Pharynx: Oropharynx is clear.  Eyes:     Extraocular Movements: Extraocular movements intact.     Conjunctiva/sclera: Conjunctivae normal.     Pupils: Pupils are equal, round, and reactive to light.  Cardiovascular:     Rate and Rhythm: Normal rate and regular rhythm.     Pulses: Normal pulses.     Heart sounds: Normal heart sounds.  Pulmonary:     Effort: Pulmonary effort is normal.     Breath sounds: Normal  breath sounds.  Abdominal:     General: Abdomen is flat. Bowel sounds are normal.     Palpations: Abdomen is soft.  Musculoskeletal:       Arms:     Cervical back: Normal range of motion and neck supple.  Skin:    General: Skin is warm.  Neurological:     General: No focal deficit present.     Mental Status: He is alert and oriented to person, place, and time.  Psychiatric:        Mood and Affect: Mood normal.        Behavior: Behavior normal.     ED  Results / Procedures / Treatments   Labs (all labs ordered are listed, but only abnormal results are displayed) Labs Reviewed - No data to display  EKG None  Radiology DG Shoulder Right  Result Date: 01/20/2020 CLINICAL DATA:  Fall.  Right shoulder pain. EXAM: RIGHT SHOULDER - 2+ VIEW COMPARISON:  None. FINDINGS: There is no evidence of fracture or dislocation. There is no evidence of arthropathy or other focal bone abnormality. Soft tissues are unremarkable. IMPRESSION: Negative. Electronically Signed   By: Kennith Center M.D.   On: 01/20/2020 07:24    Procedures Procedures (including critical care time)  Medications Ordered in ED Medications  ketorolac (TORADOL) 30 MG/ML injection 30 mg (30 mg Intramuscular Given 01/20/20 6073)    ED Course  I have reviewed the triage vital signs and the nursing notes.  Pertinent labs & imaging results that were available during my care of the patient were reviewed by me and considered in my medical decision making (see chart for details).    MDM Rules/Calculators/A&P                         Xray negative.  Pt placed in a sling.  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  He is given a note for work.  He is instructed to f/u with ortho.  Return if worse.   Final Clinical Impression(s) / ED Diagnoses Final diagnoses:  Strain of right shoulder, initial encounter    Rx / DC Orders ED Discharge Orders         Ordered    HYDROcodone-acetaminophen (NORCO/VICODIN) 5-325 MG  tablet  Every 4 hours PRN        01/20/20 0731           Jacalyn Lefevre, MD 01/20/20 [phone]

## 2020-01-20 NOTE — ED Triage Notes (Signed)
Pt states he fell in yard last night while walking dog and c/o right shoulder pain.

## 2020-02-12 ENCOUNTER — Telehealth: Payer: Self-pay | Admitting: *Deleted

## 2020-02-12 NOTE — Telephone Encounter (Signed)
   Primary Cardiologist: Nanetta Batty, MD / Dr Elberta Fortis  Chart reviewed as part of pre-operative protocol coverage. Patient was last seen by Dr. Elberta Fortis on 03/01/2019. Procedure is not scheduled until 03/28/2020. Because of Isaac Valdez's past medical history and time since last visit, he will require a follow-up visit in order to better assess preoperative cardiovascular risk.  Pre-op covering staff: - Please schedule appointment and call patient to inform them. If patient already had an upcoming appointment within acceptable timeframe, please add "pre-op clearance" to the appointment notes so provider is aware. - Please contact requesting surgeon's office via preferred method (i.e, phone, fax) to inform them of need for appointment prior to surgery.  If applicable, this message will also be routed to pharmacy pool and/or primary cardiologist for input on holding anticoagulant/antiplatelet agent as requested below so that this information is available to the clearing provider at time of patient's appointment.   Corrin Parker, PA-C  02/12/2020, 11:56 AM

## 2020-02-12 NOTE — Telephone Encounter (Signed)
   [facility] Pre-operative Risk Assessment    HEARTCARE STAFF: - Please ensure there is not already an duplicate clearance open for this procedure. - Under Visit Info/Reason for Call, type in Other and utilize the format Clearance MM/DD/YY or Clearance TBD. Do not use dashes or single digits. - If request is for dental extraction, please clarify the # of teeth to be extracted.  Request for surgical clearance:  1. What type of surgery is being performed? RIGHT SHOULDER SCOPE, ROTATOR CUFF REPAIR   2. When is this surgery scheduled? 03/28/20   3. What type of clearance is required (medical clearance vs. Pharmacy clearance to hold med vs. Both)? MEDICAL  4. Are there any medications that need to be held prior to surgery and how long? NONE LISTED   5. Practice name and name of physician performing surgery? [facility]; DR. DAX VARKEY   6. What is the office phone number? [phone]   7.   What is the office fax number? [place].   Anesthesia type (None, local, MAC, general) ? CHOICE   Julaine Hua 02/12/2020, 11:45 AM  _________________________________________________________________   (provider comments below)

## 2020-02-12 NOTE — Telephone Encounter (Signed)
Need appt with Dr Elberta Fortis for pre op clearance

## 2020-02-14 NOTE — Telephone Encounter (Signed)
I will send note to requesting office as FYI.

## 2020-02-14 NOTE — Telephone Encounter (Signed)
Follow up   Spoke to patient who says he is not sure if he is going to have the procedure and will call back if he needs to make an appointment.

## 2020-03-25 ENCOUNTER — Ambulatory Visit: Payer: Medicare Other | Admitting: Student

## 2020-03-28 ENCOUNTER — Ambulatory Visit (HOSPITAL_BASED_OUTPATIENT_CLINIC_OR_DEPARTMENT_OTHER): Admit: 2020-03-28 | Payer: Medicare Other | Admitting: Orthopaedic Surgery

## 2020-03-28 ENCOUNTER — Encounter (HOSPITAL_BASED_OUTPATIENT_CLINIC_OR_DEPARTMENT_OTHER): Payer: Self-pay

## 2020-03-28 SURGERY — SHOULDER ARTHROSCOPY WITH SUBACROMIAL DECOMPRESSION AND DISTAL CLAVICLE EXCISION
Anesthesia: Choice | Laterality: Right

## 2020-03-29 ENCOUNTER — Encounter: Payer: Self-pay | Admitting: Student

## 2020-03-29 ENCOUNTER — Other Ambulatory Visit: Payer: Self-pay

## 2020-03-29 ENCOUNTER — Ambulatory Visit: Payer: Medicare Other | Admitting: Student

## 2020-03-29 VITALS — BP 142/70 | HR 74 | Ht 71.0 in | Wt 329.0 lb

## 2020-03-29 DIAGNOSIS — R002 Palpitations: Secondary | ICD-10-CM

## 2020-03-29 DIAGNOSIS — I5022 Chronic systolic (congestive) heart failure: Secondary | ICD-10-CM | POA: Diagnosis not present

## 2020-03-29 DIAGNOSIS — I483 Typical atrial flutter: Secondary | ICD-10-CM | POA: Diagnosis not present

## 2020-03-29 LAB — BASIC METABOLIC PANEL
BUN/Creatinine Ratio: 26 — ABNORMAL HIGH (ref 9–20)
BUN: 36 mg/dL — ABNORMAL HIGH (ref 6–24)
CO2: 20 mmol/L (ref 20–29)
Calcium: 9.6 mg/dL (ref 8.7–10.2)
Chloride: 104 mmol/L (ref 96–106)
Creatinine, Ser: 1.4 mg/dL — ABNORMAL HIGH (ref 0.76–1.27)
GFR calc Af Amer: 64 mL/min/{1.73_m2} (ref >59)
GFR calc non Af Amer: 55 mL/min/{1.73_m2} — ABNORMAL LOW (ref >59)
Glucose: 116 mg/dL — ABNORMAL HIGH (ref 65–99)
Potassium: 4.3 mmol/L (ref 3.5–5.2)
Sodium: 140 mmol/L (ref 134–144)

## 2020-03-29 LAB — CBC
Hematocrit: 34.8 % — ABNORMAL LOW (ref 37.5–51.0)
Hemoglobin: 11.1 g/dL — ABNORMAL LOW (ref 13.0–17.7)
MCH: 28.6 pg (ref 26.6–33.0)
MCHC: 31.9 g/dL (ref 31.5–35.7)
MCV: 90 fL (ref 79–97)
Platelets: 303 10*3/uL (ref 150–450)
RBC: 3.88 x10E6/uL — ABNORMAL LOW (ref 4.14–5.80)
RDW: 14.6 % (ref 11.6–15.4)
WBC: 9.7 10*3/uL (ref 3.4–10.8)

## 2020-03-29 MED ORDER — CARVEDILOL 25 MG PO TABS
25.0000 mg | ORAL_TABLET | Freq: Two times a day (BID) | ORAL | 3 refills | Status: DC
Start: 2020-03-29 — End: 2021-04-15

## 2020-03-29 NOTE — Patient Instructions (Signed)
Medication Instructions:  Increase Carvedilol (Coreg) 25 mg twice a day   *If you need a refill on your cardiac medications before your next appointment, please call your pharmacy*   Lab Work: Bmp, Cbc- Today   If you have labs (blood work) drawn today and your tests are completely normal, you will receive your results only by: Marland Kitchen MyChart Message (if you have MyChart) OR . A paper copy in the mail If you have any lab test that is abnormal or we need to change your treatment, we will call you to review the results.   Testing/Procedures: None ordered    Follow-Up: At [facility], you and your health needs are our priority.  As part of our continuing mission to provide you with exceptional heart care, we have created designated Provider Care Teams.  These Care Teams include your primary Cardiologist (physician) and Advanced Practice Providers (APPs -  Physician Assistants and Nurse Practitioners) who all work together to provide you with the care you need, when you need it.  We recommend signing up for the patient portal called "MyChart".  Sign up information is provided on this After Visit Summary.  MyChart is used to connect with patients for Virtual Visits (Telemedicine).  Patients are able to view lab/test results, encounter notes, upcoming appointments, etc.  Non-urgent messages can be sent to your provider as well.   To learn more about what you can do with MyChart, go to [email or web address].    Your next appointment:   3 month(s)  The format for your next appointment:   In Person  Provider:   Nanetta Batty, MD  Your physician wants you to follow-up in 1 year with Will Elberta Fortis, MD. Bonita Quin will receive a reminder letter in the mail two months in advance. If you don't receive a letter, please call our office to schedule the follow-up appointment.   Other Instructions None ordered

## 2020-03-29 NOTE — Progress Notes (Signed)
PCP:  Si Gaul, DO Primary Cardiologist: Nanetta Batty, MD Electrophysiologist: Will Jorja Loa, MD   Isaac Valdez is a 60 y.o. male seen today for Will Jorja Loa, MD for cardiac clearance for right shoulder scope, Rotator cuff repair.  Since last being seen in our clinic the patient reports doing well overall. He is still debating on whether or not he wants to proceed with shoulder surgery, as currently his symptoms are mild/stable. He has occasional palpitations, but generally last for seconds. he denies chest pain, dyspnea, PND, orthopnea, nausea, vomiting, dizziness, syncope, edema, weight gain, or early satiety.  Past Medical History:  Diagnosis Date   Back pain    CHF (congestive heart failure) (HCC)    Diabetes mellitus    Dyspnea    Gout    High cholesterol    Hypertension    Past Surgical History:  Procedure Laterality Date   A-FLUTTER ABLATION N/A 04/21/2017   Procedure: A-FLUTTER ABLATION;  Surgeon: Regan Lemming, MD;  Location: [facility];  Service: Cardiovascular;  Laterality: N/A;   BACK SURGERY     MANDIBLE FRACTURE SURGERY      Current Outpatient Medications  Medication Sig Dispense Refill   amLODipine (NORVASC) 5 MG tablet Take 5 mg by mouth daily.     atorvastatin (LIPITOR) 80 MG tablet Take 80 mg by mouth at bedtime.     BOTOX 100 units SOLR injection Inject 100 Units into the muscle every 3 (three) months.     carvedilol (COREG) 12.5 MG tablet Take 1 tablet (12.5 mg total) by mouth 2 (two) times daily. Dose increase 60 tablet 11   clonazePAM (KLONOPIN) 0.5 MG tablet Take 1 tablet (0.5 mg total) by mouth 3 (three) times daily. (Patient taking differently: Take 0.5 mg by mouth as needed.) 90 tablet 1   cloNIDine (CATAPRES) 0.1 MG tablet as needed.     famotidine (PEPCID) 40 MG tablet Take 40 mg by mouth daily.     glipiZIDE (GLUCOTROL) 5 MG tablet Take 5 mg by mouth daily as needed (CBG >120).       HYDROcodone-acetaminophen (NORCO/VICODIN) 5-325 MG tablet Take 1 tablet by mouth every 4 (four) hours as needed. 10 tablet 0   lisinopril (ZESTRIL) 10 MG tablet Take 10 mg by mouth daily.     oxyCODONE-acetaminophen (PERCOCET/ROXICET) 5-325 MG tablet Take 1-2 tablets by mouth every 8 (eight) hours as needed for severe pain. 10 tablet 0   torsemide (DEMADEX) 20 MG tablet Take 20 mg by mouth at bedtime.     No current facility-administered medications for this visit.    Allergies  Allergen Reactions   Aspirin Other (See Comments) and Anaphylaxis    "indigestion" "indigestion" Other reaction(s): Other (See Comments), Other (See Comments) "indigestion" "indigestion"    Social History   Socioeconomic History   Marital status: Married    Spouse name: Not on file   Number of children: 2   Years of education: 11   Highest education level: 11th grade  Occupational History   Occupation: part time truck driver   Occupation: on disability  Tobacco Use   Smoking status: Former Smoker    Packs/day: 0.50   Smokeless tobacco: Never Used  Building services engineer Use: Never used  Substance and Sexual Activity   Alcohol use: Not Currently    Comment: rarely   Drug use: No   Sexual activity: Yes    Birth control/protection: None  Other Topics Concern   Not  on file  Social History Narrative   Lives with wife in a one story home.  Has 2 children.     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     Education: 11th grade.    Right handed   Social Determinants of Health   Financial Resource Strain: Not on file  Food Insecurity: Not on file  Transportation Needs: Not on file  Physical Activity: Not on file  Stress: Not on file  Social Connections: Not on file  Intimate Partner Violence: Not on file    Review of Systems: General: No chills, fever, night sweats or weight changes  Cardiovascular:  No chest pain, dyspnea on exertion, edema, orthopnea,  palpitations, paroxysmal nocturnal dyspnea Dermatological: No rash, lesions or masses Respiratory: No cough, dyspnea Urologic: No hematuria, dysuria Abdominal: No nausea, vomiting, diarrhea, bright red blood per rectum, melena, or hematemesis Neurologic: No visual changes, weakness, changes in mental status All other systems reviewed and are otherwise negative except as noted above.  Physical Exam: Vitals:   03/29/20 0848  BP: (!) 142/70  Pulse: 74  SpO2: 98%  Weight: (!) 329 lb (149.2 kg)  Height: 5\' 11"  (1.803 m)    GEN- The patient is well appearing, alert and oriented x 3 today.   HEENT: normocephalic, atraumatic; sclera clear, conjunctiva pink; hearing intact; oropharynx clear; neck supple, no JVP Lymph- no cervical lymphadenopathy Lungs- Clear to ausculation bilaterally, normal work of breathing.  No wheezes, rales, rhonchi Heart- Regular rate and rhythm, no murmurs, rubs or gallops, PMI not laterally displaced GI- Obese, soft, non-tender, non-distended, bowel sounds present, no hepatosplenomegaly Extremities- no clubbing, cyanosis, or edema; DP/PT/radial pulses 2+ bilaterally MS- no significant deformity or atrophy Skin- warm and dry, no rash or lesion Psych- euthymic mood, full affect Neuro- strength and sensation are intact  EKG is ordered. Personal review of EKG from today shows NSR at 67 bpm  Additional studies reviewed include: Previous EP notes  Assessment and Plan:  1. Typical atrial flutter s/p ablation 04/2017 Mostly stable.  Monitor last year shows NSVT and PVCs. Coreg was increased.  Occasional palpitations.  2. HTN Elevated on arrival Increase coreg to 25 mg BID with HTN and occasional breakthrough palpitations as above.   3. Chronic systolic CHF NYHA II-III symptoms Volume status stable.  Most recent EF 45-50% on Coreg, torsemide, and lisinopril He should be seen back by gen cards post shoulder surgery and considered for [facility] given its label  expansion for use in patients with EF in this range.   4. Cardiac clearance for R shoulder surgery Echo 2019 showed LVEF 45-50% The patient is cleared for surgery from a cardiac perspective with an estimated "Low Risk" of perioperative cardiac complications by the Revised Cardiac Risk Index 2020 Criteria). The patient may proceed without further cardiac work up.   Gen cards follow up 3 months, EP follow up 12 months, sooner with symptoms.   Nedra Hai, PA-C  03/29/20 8:57 AM

## 2020-03-29 NOTE — Addendum Note (Signed)
Addended by: Winifred Olive on: 03/29/2020 09:18 AM   Modules accepted: Orders

## 2020-05-22 ENCOUNTER — Telehealth: Payer: Self-pay | Admitting: *Deleted

## 2020-05-22 NOTE — Telephone Encounter (Signed)
   [facility] Pre-operative Risk Assessment    HEARTCARE STAFF: - Please ensure there is not already an duplicate clearance open for this procedure. - Under Visit Info/Reason for Call, type in Other and utilize the format Clearance MM/DD/YY or Clearance TBD. Do not use dashes or single digits. - If request is for dental extraction, please clarify the # of teeth to be extracted.  Request for surgical clearance:  1. What type of surgery is being performed? RIGHT SHOULDER SCOPE, ROTATOR CUFF REPAIR   2. When is this surgery scheduled? TBD (ORIGINAL DOS WAS 03/28/20, SEE PREVIOUS CLEARANCE REQUEST)   3. What type of clearance is required (medical clearance vs. Pharmacy clearance to hold med vs. Both)? MEDICAL  4. Are there any medications that need to be held prior to surgery and how long? NONE LISTED    5. Practice name and name of physician performing surgery? [facility]; DR. DAX VARKEY   6. What is the office phone number? [phone]   7.   What is the office fax number? [place].   Anesthesia type (None, local, MAC, general) ? CHOICE   Julaine Hua 05/22/2020, 10:12 AM  _________________________________________________________________   (provider comments below)

## 2020-05-23 NOTE — Telephone Encounter (Signed)
   Primary Cardiologist: Nanetta Batty, MD  Chart reviewed as part of pre-operative protocol coverage. Patient was contacted 05/23/2020 in reference to pre-operative risk assessment for pending surgery as outlined below.  Isaac Valdez was last seen on 03/29/20 by Otilio Saber, PA-C.  Since that day, Rickie Gange has done well from a cardiac standpoint. He can easily complete 4 METs without anginal complaints.  Therefore, based on ACC/AHA guidelines, the patient would be at acceptable risk for the planned procedure without further cardiovascular testing.   The patient was advised that if he develops new symptoms prior to surgery to contact our office to arrange for a follow-up visit, and he verbalized understanding.  I will route this recommendation to the requesting party via Epic fax function and remove from pre-op pool. Please call with questions.  Beatriz Stallion, PA-C 05/23/2020, 10:00 AM

## 2020-06-26 ENCOUNTER — Ambulatory Visit: Payer: Medicare Other | Admitting: Cardiovascular Disease

## 2020-08-07 NOTE — Progress Notes (Addendum)
Chart reviewed by Dr. Marcene Duos, anesthesiologist. Molli Knock to proceed with upcoming surgery as planned as long as patient has no new cardiac symptoms.

## 2020-08-09 ENCOUNTER — Other Ambulatory Visit: Payer: Self-pay

## 2020-08-09 ENCOUNTER — Encounter (HOSPITAL_BASED_OUTPATIENT_CLINIC_OR_DEPARTMENT_OTHER): Payer: Self-pay | Admitting: Orthopaedic Surgery

## 2020-08-12 NOTE — Discharge Instructions (Addendum)
Next dose of Tylenol can be given at 1:00pm if needed.   Post Anesthesia Home Care Instructions  Activity: Get plenty of rest for the remainder of the day. A responsible individual must stay with you for 24 hours following the procedure.  For the next 24 hours, DO NOT: -Drive a car -Advertising copywriter -Drink alcoholic beverages -Take any medication unless instructed by your physician -Make any legal decisions or sign important papers.  Meals: Start with liquid foods such as gelatin or soup. Progress to regular foods as tolerated. Avoid greasy, spicy, heavy foods. If nausea and/or vomiting occur, drink only clear liquids until the nausea and/or vomiting subsides. Call your physician if vomiting continues.  Special Instructions/Symptoms: Your throat may feel dry or sore from the anesthesia or the breathing tube placed in your throat during surgery. If this causes discomfort, gargle with warm salt water. The discomfort should disappear within 24 hours.       Regional Anesthesia Blocks  1. Numbness or the inability to move the "blocked" extremity may last from 3-48 hours after placement. The length of time depends on the medication injected and your individual response to the medication. If the numbness is not going away after 48 hours, call your surgeon.  2. The extremity that is blocked will need to be protected until the numbness is gone and the  Strength has returned. Because you cannot feel it, you will need to take extra care to avoid injury. Because it may be weak, you may have difficulty moving it or using it. You may not know what position it is in without looking at it while the block is in effect.  3. For blocks in the legs and feet, returning to weight bearing and walking needs to be done carefully. You will need to wait until the numbness is entirely gone and the strength has returned. You should be able to move your leg and foot normally before you try and bear weight or walk.  You will need someone to be with you when you first try to ensure you do not fall and possibly risk injury.  4. Bruising and tenderness at the needle site are common side effects and will resolve in a few days.  5. Persistent numbness or new problems with movement should be communicated to the surgeon or the [facility] [phone] [facility] [phone]).    Information for Discharge Teaching: EXPAREL (bupivacaine liposome injectable suspension)   Your surgeon or anesthesiologist gave you EXPAREL(bupivacaine) to help control your pain after surgery.  EXPAREL is a local anesthetic that provides pain relief by numbing the tissue around the surgical site. EXPAREL is designed to release pain medication over time and can control pain for up to 72 hours. Depending on how you respond to EXPAREL, you may require less pain medication during your recovery.  Possible side effects: Temporary loss of sensation or ability to move in the area where bupivacaine was injected. Nausea, vomiting, constipation Rarely, numbness and tingling in your mouth or lips, lightheadedness, or anxiety may occur. Call your doctor right away if you think you may be experiencing any of these sensations, or if you have other questions regarding possible side effects.  Follow all other discharge instructions given to you by your surgeon or nurse. Eat a healthy diet and drink plenty of water or other fluids.  If you return to the hospital for any reason within 96 hours following the administration of EXPAREL, it is important for health  care providers to know that you have received this anesthetic. A teal colored band has been placed on your arm with the date, time and amount of EXPAREL you have received in order to alert and inform your health care providers. Please leave this armband in place for the full 96 hours following administration, and then you may remove the band.   Ramond Marrow MD,  MP[facility] Alfonse Alpers, PA-C [facility] [address], [address] [phone] (tel)   [phone] (fax)   POST-OPERATIVE INSTRUCTIONS - SHOULDER ARTHROSCOPY  WOUND CARE You may remove the Operative Dressing on Post-Op Day #3 (72hrs after surgery).   Alternatively if you would like you can leave dressing on until follow-up if within 7-8 days but keep it dry. Leave steri-strips in place until they fall off on their own, usually 2 weeks postop. There may be a small amount of fluid/bleeding leaking at the surgical site.  This is normal; the shoulder is filled with fluid during the procedure and can leak for 24-48hrs after surgery.  You may change/reinforce the bandage as needed.  Use the Cryocuff or Ice as often as possible for the first 7 days, then as needed for pain relief. Always keep a towel, ACE wrap or other barrier between the cooling unit and your skin.  You may shower on Post-Op Day #3. Gently pat the area dry. Do not soak the shoulder in water or submerge it. Keep incisions as dry as possible. Do not go swimming in the pool or ocean until 4 weeks after surgery or when otherwise instructed.    EXERCISES/BRACING Sling should be used at all times until follow-up.  You can remove sling for hygiene.    Please continue to ambulate and do not stay sitting or lying for too long. Perform foot and wrist pumps to assist in circulation.  POST-OP MEDICATIONS- Multimodal approach to pain control In general your pain will be controlled with a combination of substances.  Prescriptions unless otherwise discussed are electronically sent to your pharmacy.  This is a carefully made plan we use to minimize narcotic use.     Celebrex - Anti-inflammatory medication taken on a scheduled basis Acetaminophen - Non-narcotic pain medicine taken on a scheduled basis  Oxycodone - This is a strong narcotic, to be used only on an "as needed" basis for SEVERE pain. Zofran - take as needed  for nausea   FOLLOW-UP If you develop a Fever (?101.5), Redness or Drainage from the surgical incision site, please call our office to arrange for an evaluation. Please call the office to schedule a follow-up appointment for your wound check, 10-14 days post-operatively.    HELPFUL INFORMATION  If you had a block, it will wear off between 8-24 hrs postop typically.  This is period when your pain may go from nearly zero to the pain you would have had postop without the block.  This is an abrupt transition but nothing dangerous is happening.  You may take an extra dose of narcotic when this happens.  You may be more comfortable sleeping in a semi-seated position the first few nights following surgery.  Keep a pillow propped under the elbow and forearm for comfort.  If you have a recliner type of chair it might be beneficial.  If not that is fine too, but it would be helpful to sleep propped up with pillows behind your operated shoulder as well under your elbow and forearm.  This will reduce pulling on the suture lines.  When dressing, put your operative arm in the sleeve first.  When getting undressed, take your operative arm out last.  Loose fitting, button-down shirts are recommended.  Often in the first days after surgery you may be more comfortable keeping your operative arm under your shirt and not through the sleeve.  You may return to work/school in the next couple of days when you feel up to it.  Desk work and typing in the sling is fine.  We suggest you use the pain medication the first night prior to going to bed, in order to ease any pain when the anesthesia wears off. You should avoid taking pain medications on an empty stomach as it will make you nauseous.  You should wean off your narcotic medicines as soon as you are able.  Most patients will be off or using minimal narcotics before their first postop appointment.   Do not drink alcoholic beverages or take illicit drugs when  taking pain medications.  It is against the law to drive while taking narcotics.  In some states it is against the law to drive while your arm is in a sling.   Pain medication may make you constipated.  Below are a few solutions to try in this order: Decrease the amount of pain medication if you aren't having pain. Drink lots of decaffeinated fluids. Drink prune juice and/or eat dried prunes  If the first 3 don't work start with additional solutions Take Colace - an over-the-counter stool softener Take Senokot - an over-the-counter laxative Take Miralax - a stronger over-the-counter laxative  For more information including helpful videos and documents visit our website:   [email or web address]

## 2020-08-12 NOTE — H&P (Signed)
PREOPERATIVE H&P  Chief Complaint: RIGHT SHOULDER IMPINGEMENT SYNDROME,OSTEOARTHRITIS,STRAIN OF MUSCLE AND TENDON OF THE ROTATOR CUFF  HPI: Isaac Valdez is a 60 y.o. male who is scheduled for, Procedure(s): IRRIGATION AND DEBRIDEMENT SHOULDER SHOULDER ARTHROSCOPY WITH DISTAL CLAVICLE RESECTION SHOULDER ARTHROSCOPY WITH ROTATOR CUFF REPAIR AND SUBACROMIAL DECOMPRESSION SHOULDER ACROMIOPLASTY.   Patient has a past medical history significant for sleep apnea on BIPAP, HTN, HLD, GERD, CHF, DM.   The patient is a 60 year old male who has had right shoulder pain after a fall.  He has had dysfunction of the shoulder.  He has limited range of motion.    His symptoms are rated as moderate to severe, and have been worsening.  This is significantly impairing activities of daily living.    Please see clinic note for further details on this patient's care.    He has elected for surgical management.   Past Medical History:  Diagnosis Date  . Arthritis   . Back pain   . CHF (congestive heart failure) (HCC)   . Diabetes mellitus   . Dyspnea   . GERD (gastroesophageal reflux disease)   . Gout   . High cholesterol   . Hypertension   . Sleep apnea    uses BIPAP nightly   Past Surgical History:  Procedure Laterality Date  . A-FLUTTER ABLATION N/A 04/21/2017   Procedure: A-FLUTTER ABLATION;  Surgeon: Regan Lemming, MD;  Location: [facility];  Service: Cardiovascular;  Laterality: N/A;  . BACK SURGERY    . MANDIBLE FRACTURE SURGERY     Social History   Socioeconomic History  . Marital status: Married    Spouse name: Not on file  . Number of children: 2  . Years of education: 35  . Highest education level: 11th grade  Occupational History  . Occupation: part time truck Hospital doctor  . Occupation: on disability  Tobacco Use  . Smoking status: Current Some Day Smoker    Packs/day: 0.50    Types: Cigars  . Smokeless tobacco: Never Used  . Tobacco comment: occais  cigar  Vaping Use  . Vaping Use: Never used  Substance and Sexual Activity  . Alcohol use: Yes    Comment: social  . Drug use: No  . Sexual activity: Yes    Birth control/protection: None  Other Topics Concern  . Not on file  Social History Narrative   Lives with wife in a one story home.  Has 2 children.     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     Education: 11th grade.    Right handed   Social Determinants of Health   Financial Resource Strain: Not on file  Food Insecurity: Not on file  Transportation Needs: Not on file  Physical Activity: Not on file  Stress: Not on file  Social Connections: Not on file   Family History  Problem Relation Age of Onset  . Heart disease Mother   . Hypertension Sister   . Other Father        hypothermia  . Heart failure Son    Allergies  Allergen Reactions  . Aspirin Other (See Comments) and Anaphylaxis    "indigestion" "indigestion" Other reaction(s): Other (See Comments), Other (See Comments) "indigestion" "indigestion"   Prior to Admission medications   Medication Sig Start Date End Date Taking? Authorizing Provider  acetaminophen (TYLENOL) 500 MG tablet Take by mouth as needed.   Yes [provider]  amLODipine (NORVASC) 5 MG  tablet Take 5 mg by mouth daily.   Yes [provider]  carvedilol (COREG) 25 MG tablet Take 1 tablet (25 mg total) by mouth 2 (two) times daily. 03/29/20  Yes Graciella Freer, PA-C  clonazePAM (KLONOPIN) 0.5 MG tablet Take 1 tablet (0.5 mg total) by mouth 3 (three) times daily. Patient taking differently: Take 0.5 mg by mouth as needed. 04/19/18  Yes Tat, Octaviano Batty, DO  famotidine (PEPCID) 40 MG tablet Take 40 mg by mouth daily. 11/09/19  Yes [provider]  glipiZIDE (GLUCOTROL) 5 MG tablet Take 5 mg by mouth daily as needed (CBG >120).  08/12/16  Yes [provider]  probenecid (BENEMID) 500 MG tablet Take by mouth daily.   Yes [provider]  torsemide (DEMADEX) 20 MG tablet Take 20 mg by mouth at bedtime.   Yes [provider]  atorvastatin (LIPITOR) 80 MG tablet Take 80 mg by mouth at bedtime. 12/10/18   [provider]  cloNIDine (CATAPRES) 0.1 MG tablet as needed. 12/20/19   [provider]  terbinafine (LAMISIL) 1 % cream as needed. 03/27/20   [provider]    ROS: All other systems have been reviewed and were otherwise negative with the exception of those mentioned in the HPI and as above.  Physical Exam: General: Alert, no acute distress Cardiovascular: No pedal edema Respiratory: No cyanosis, no use of accessory musculature GI: No organomegaly, abdomen is soft and non-tender Skin: No lesions in the area of chief complaint Neurologic: Sensation intact distally Psychiatric: Patient is competent for consent with normal mood and affect Lymphatic: No axillary or cervical lymphadenopathy  MUSCULOSKELETAL:  Right shoulder: Active forward elevation to about 90; passive to about 120 limited by pain.  He has obvious olecranon bursitis.  There is no sign of redness or erythema.  He has full range of motion of his elbow.    Imaging: MRI of the shoulder demonstrates per the radiologist a full thickness cuff tear; however, to my eye it is possibly partial thickness with no atrophy.  He has some other degenerative change around his shoulder as well.    Assessment: RIGHT SHOULDER IMPINGEMENT SYNDROME,OSTEOARTHRITIS,STRAIN OF MUSCLE AND TENDON OF THE ROTATOR CUFF  Plan: Plan for Procedure(s): IRRIGATION AND DEBRIDEMENT SHOULDER SHOULDER ARTHROSCOPY WITH DISTAL CLAVICLE RESECTION SHOULDER ARTHROSCOPY WITH ROTATOR CUFF REPAIR AND SUBACROMIAL DECOMPRESSION SHOULDER ACROMIOPLASTY  The risks benefits and alternatives were discussed with the patient including but not limited to the risks of nonoperative treatment, versus surgical intervention including infection, bleeding, nerve  injury,  blood clots, cardiopulmonary complications, morbidity, mortality, among others, and they were willing to proceed.   The patient acknowledged the explanation, agreed to proceed with the plan and consent was signed.   Patient has received clearance from cardiology, Dr. Allyson Sabal, and PCP, Dr. Valentina Lucks.    Operative Plan: Right shoulder scope with cuff repair with subacromial decompression, distal clavicle excision, and biceps tenodesis.   Discharge Medications: Tylenol, Celebrex, Oxycodone, Zofran DVT Prophylaxis: None Physical Therapy: Outpatient PT - [facility] Discharge needs: Sling. IceMan   Vernetta Honey, PA-C  08/12/2020 5:20 PM

## 2020-08-13 ENCOUNTER — Encounter (HOSPITAL_BASED_OUTPATIENT_CLINIC_OR_DEPARTMENT_OTHER)
Admission: RE | Admit: 2020-08-13 | Discharge: 2020-08-13 | Disposition: A | Discharge status: Home / Self Care | Payer: Medicare Other | Source: Ambulatory Visit | Attending: Orthopaedic Surgery | Admitting: Orthopaedic Surgery

## 2020-08-13 DIAGNOSIS — Z01812 Encounter for preprocedural laboratory examination: Secondary | ICD-10-CM | POA: Insufficient documentation

## 2020-08-13 LAB — BASIC METABOLIC PANEL
Anion gap: 9 (ref 5–15)
BUN: 26 mg/dL — ABNORMAL HIGH (ref 6–20)
CO2: 25 mmol/L (ref 22–32)
Calcium: 9.3 mg/dL (ref 8.9–10.3)
Chloride: 105 mmol/L (ref 98–111)
Creatinine, Ser: 1.27 mg/dL — ABNORMAL HIGH (ref 0.61–1.24)
GFR, Estimated: >60 mL/min (ref >60)
Glucose, Bld: 107 mg/dL — ABNORMAL HIGH (ref 70–99)
Potassium: 4.2 mmol/L (ref 3.5–5.1)
Sodium: 139 mmol/L (ref 135–145)

## 2020-08-13 NOTE — Progress Notes (Signed)
Anesthesia consult per Dr. Nance Pew, will proceed with surgery as scheduled.

## 2020-08-13 NOTE — Progress Notes (Signed)

## 2020-08-14 NOTE — Anesthesia Preprocedure Evaluation (Addendum)
Anesthesia Evaluation  Patient identified by MRN, date of birth, ID band Patient awake    Reviewed: Allergy & Precautions, NPO status , Patient's Chart, lab work & pertinent test results, reviewed documented beta blocker date and time   History of Anesthesia Complications Negative for: history of anesthetic complications  Airway Mallampati: IV  TM Distance: >3 FB Neck ROM: Full    Dental  (+) Missing,    Pulmonary sleep apnea and Continuous Positive Airway Pressure Ventilation , Current Smoker,    Pulmonary exam normal        Cardiovascular hypertension, Pt. on medications and Pt. on home beta blockers + Peripheral Vascular Disease and +CHF  Normal cardiovascular exam  TTE 2019: EF 45-50%, moderate LVE, mild LVH, mild LAE, mild MR, mild AS   Neuro/Psych negative neurological ROS  negative psych ROS   GI/Hepatic Neg liver ROS, GERD  Controlled and Medicated,  Endo/Other  diabetes, Type obesity  Renal/GU Renal InsufficiencyRenal disease  negative genitourinary   Musculoskeletal  (+) Arthritis ,   Abdominal   Peds  Hematology negative hematology ROS (+)   Anesthesia Other Findings Day of surgery medications reviewed with patient.  Reproductive/Obstetrics negative OB ROS                            Anesthesia Physical Anesthesia Plan  ASA: III  Anesthesia Plan: General   Post-op Pain Management: GA combined w/ Regional for post-op pain   Induction: Intravenous  PONV Risk Score and Plan: 1 and Treatment may vary due to age or medical condition, Midazolam, Dexamethasone and Ondansetron  Airway Management Planned: Oral ETT  Additional Equipment: None  Intra-op Plan:   Post-operative Plan: Extubation in OR  Informed Consent: I have reviewed the patients History and Physical, chart, labs and discussed the procedure including the risks, benefits and alternatives for the proposed  anesthesia with the patient or authorized representative who has indicated his/her understanding and acceptance.     Dental advisory given  Plan Discussed with: CRNA  Anesthesia Plan Comments:        Anesthesia Quick Evaluation

## 2020-08-15 ENCOUNTER — Ambulatory Visit (HOSPITAL_BASED_OUTPATIENT_CLINIC_OR_DEPARTMENT_OTHER): Payer: Medicare Other | Admitting: Anesthesiology

## 2020-08-15 ENCOUNTER — Encounter (HOSPITAL_BASED_OUTPATIENT_CLINIC_OR_DEPARTMENT_OTHER)
Admission: RE | Disposition: A | Discharge status: Home / Self Care | Payer: Self-pay | Source: Home / Self Care | Attending: Orthopaedic Surgery

## 2020-08-15 ENCOUNTER — Encounter (HOSPITAL_BASED_OUTPATIENT_CLINIC_OR_DEPARTMENT_OTHER): Payer: Self-pay | Admitting: Orthopaedic Surgery

## 2020-08-15 ENCOUNTER — Other Ambulatory Visit: Payer: Self-pay

## 2020-08-15 ENCOUNTER — Ambulatory Visit (HOSPITAL_BASED_OUTPATIENT_CLINIC_OR_DEPARTMENT_OTHER)
Admission: RE | Admit: 2020-08-15 | Discharge: 2020-08-15 | Disposition: A | Discharge status: Home / Self Care | Payer: Medicare Other | Attending: Orthopaedic Surgery | Admitting: Orthopaedic Surgery

## 2020-08-15 DIAGNOSIS — M19011 Primary osteoarthritis, right shoulder: Secondary | ICD-10-CM | POA: Diagnosis not present

## 2020-08-15 DIAGNOSIS — E785 Hyperlipidemia, unspecified: Secondary | ICD-10-CM | POA: Insufficient documentation

## 2020-08-15 DIAGNOSIS — E7800 Pure hypercholesterolemia, unspecified: Secondary | ICD-10-CM | POA: Diagnosis not present

## 2020-08-15 DIAGNOSIS — K219 Gastro-esophageal reflux disease without esophagitis: Secondary | ICD-10-CM | POA: Insufficient documentation

## 2020-08-15 DIAGNOSIS — W19XXXA Unspecified fall, initial encounter: Secondary | ICD-10-CM | POA: Insufficient documentation

## 2020-08-15 DIAGNOSIS — M7541 Impingement syndrome of right shoulder: Secondary | ICD-10-CM | POA: Insufficient documentation

## 2020-08-15 DIAGNOSIS — I110 Hypertensive heart disease with heart failure: Secondary | ICD-10-CM | POA: Insufficient documentation

## 2020-08-15 DIAGNOSIS — I509 Heart failure, unspecified: Secondary | ICD-10-CM | POA: Diagnosis not present

## 2020-08-15 DIAGNOSIS — Z87892 Personal history of anaphylaxis: Secondary | ICD-10-CM | POA: Diagnosis not present

## 2020-08-15 DIAGNOSIS — E119 Type 2 diabetes mellitus without complications: Secondary | ICD-10-CM | POA: Diagnosis not present

## 2020-08-15 DIAGNOSIS — Z8249 Family history of ischemic heart disease and other diseases of the circulatory system: Secondary | ICD-10-CM | POA: Diagnosis not present

## 2020-08-15 DIAGNOSIS — F17290 Nicotine dependence, other tobacco product, uncomplicated: Secondary | ICD-10-CM | POA: Insufficient documentation

## 2020-08-15 DIAGNOSIS — S43431A Superior glenoid labrum lesion of right shoulder, initial encounter: Secondary | ICD-10-CM | POA: Diagnosis not present

## 2020-08-15 DIAGNOSIS — Z886 Allergy status to analgesic agent status: Secondary | ICD-10-CM | POA: Insufficient documentation

## 2020-08-15 DIAGNOSIS — S46011A Strain of muscle(s) and tendon(s) of the rotator cuff of right shoulder, initial encounter: Secondary | ICD-10-CM | POA: Diagnosis not present

## 2020-08-15 DIAGNOSIS — G4730 Sleep apnea, unspecified: Secondary | ICD-10-CM | POA: Insufficient documentation

## 2020-08-15 HISTORY — DX: Unspecified osteoarthritis, unspecified site: M19.90

## 2020-08-15 HISTORY — DX: Sleep apnea, unspecified: G47.30

## 2020-08-15 HISTORY — DX: Gastro-esophageal reflux disease without esophagitis: K21.9

## 2020-08-15 HISTORY — PX: SHOULDER ARTHROSCOPY WITH ROTATOR CUFF REPAIR: SHX5685

## 2020-08-15 HISTORY — PX: SHOULDER ARTHROSCOPY WITH BICEPSTENOTOMY: SHX6204

## 2020-08-15 LAB — GLUCOSE, CAPILLARY
Glucose-Capillary: 108 mg/dL — ABNORMAL HIGH (ref 70–99)
Glucose-Capillary: 147 mg/dL — ABNORMAL HIGH (ref 70–99)

## 2020-08-15 SURGERY — SHOULDER ARTHROSCOPY WITH SUBACROMIAL DECOMPRESSION AND DISTAL CLAVICLE EXCISION
Anesthesia: General | Site: Shoulder | Laterality: Right

## 2020-08-15 MED ORDER — LIDOCAINE HCL (PF) 2 % IJ SOLN
INTRAMUSCULAR | Status: AC
  Filled 2020-08-15: qty 5

## 2020-08-15 MED ORDER — FENTANYL CITRATE (PF) 100 MCG/2ML IJ SOLN
100.0000 ug | Freq: Once | INTRAMUSCULAR | Status: AC
  Administered 2020-08-15: 50 ug via INTRAVENOUS

## 2020-08-15 MED ORDER — FENTANYL CITRATE (PF) 100 MCG/2ML IJ SOLN: 25.0000 ug | INTRAMUSCULAR | Status: DC | PRN

## 2020-08-15 MED ORDER — BUPIVACAINE-EPINEPHRINE (PF) 0.5% -1:200000 IJ SOLN
INTRAMUSCULAR | Status: DC | PRN
  Administered 2020-08-15: 15 mL via PERINEURAL

## 2020-08-15 MED ORDER — OXYCODONE HCL 5 MG PO TABS
5.0000 mg | ORAL_TABLET | Freq: Once | ORAL | Status: AC | PRN
Start: 2020-08-15 — End: 2020-08-15
  Administered 2020-08-15: 5 mg via ORAL

## 2020-08-15 MED ORDER — ROCURONIUM BROMIDE 100 MG/10ML IV SOLN
INTRAVENOUS | Status: DC | PRN
  Administered 2020-08-15: 20 mg via INTRAVENOUS

## 2020-08-15 MED ORDER — CEFAZOLIN SODIUM-DEXTROSE 2-4 GM/100ML-% IV SOLN
2.0000 g | INTRAVENOUS | Status: AC
  Administered 2020-08-15: 3 g via INTRAVENOUS

## 2020-08-15 MED ORDER — EPINEPHRINE PF 1 MG/ML IJ SOLN
INTRAMUSCULAR | Status: AC
  Filled 2020-08-15: qty 8

## 2020-08-15 MED ORDER — PROMETHAZINE HCL 25 MG/ML IJ SOLN: 6.2500 mg | INTRAMUSCULAR | Status: DC | PRN

## 2020-08-15 MED ORDER — CELECOXIB 100 MG PO CAPS: 100.0000 mg | ORAL_CAPSULE | Freq: Two times a day (BID) | ORAL | 0 refills | Status: AC

## 2020-08-15 MED ORDER — LACTATED RINGERS IV SOLN: INTRAVENOUS | Status: DC

## 2020-08-15 MED ORDER — LIDOCAINE HCL (CARDIAC) PF 100 MG/5ML IV SOSY
PREFILLED_SYRINGE | INTRAVENOUS | Status: DC | PRN
  Administered 2020-08-15: 100 mg via INTRAVENOUS

## 2020-08-15 MED ORDER — CLONAZEPAM 0.5 MG PO TABS: 0.5000 mg | ORAL_TABLET | ORAL | Status: DC | PRN

## 2020-08-15 MED ORDER — MIDAZOLAM HCL 2 MG/2ML IJ SOLN
INTRAMUSCULAR | Status: AC
  Filled 2020-08-15: qty 2

## 2020-08-15 MED ORDER — SUCCINYLCHOLINE CHLORIDE 20 MG/ML IJ SOLN
INTRAMUSCULAR | Status: DC | PRN
  Administered 2020-08-15: 200 mg via INTRAVENOUS

## 2020-08-15 MED ORDER — ONDANSETRON HCL 4 MG/2ML IJ SOLN
INTRAMUSCULAR | Status: DC | PRN
  Administered 2020-08-15: 4 mg via INTRAVENOUS

## 2020-08-15 MED ORDER — ACETAMINOPHEN 500 MG PO TABS: 1000.0000 mg | ORAL_TABLET | Freq: Three times a day (TID) | ORAL | 0 refills | Status: AC

## 2020-08-15 MED ORDER — DEXAMETHASONE SODIUM PHOSPHATE 10 MG/ML IJ SOLN
INTRAMUSCULAR | Status: AC
  Filled 2020-08-15: qty 1

## 2020-08-15 MED ORDER — FENTANYL CITRATE (PF) 100 MCG/2ML IJ SOLN
INTRAMUSCULAR | Status: AC
  Filled 2020-08-15: qty 2

## 2020-08-15 MED ORDER — ACETAMINOPHEN 500 MG PO TABS
1000.0000 mg | ORAL_TABLET | Freq: Once | ORAL | Status: AC
  Administered 2020-08-15: 1000 mg via ORAL

## 2020-08-15 MED ORDER — SODIUM CHLORIDE 0.9 % IR SOLN
Status: DC | PRN
  Administered 2020-08-15: 10500 mL

## 2020-08-15 MED ORDER — PROPOFOL 500 MG/50ML IV EMUL
INTRAVENOUS | Status: AC
  Filled 2020-08-15: qty 50

## 2020-08-15 MED ORDER — FENTANYL CITRATE (PF) 100 MCG/2ML IJ SOLN
INTRAMUSCULAR | Status: DC | PRN
  Administered 2020-08-15: 100 ug via INTRAVENOUS

## 2020-08-15 MED ORDER — EPHEDRINE SULFATE 50 MG/ML IJ SOLN
INTRAMUSCULAR | Status: DC | PRN
  Administered 2020-08-15: 10 mg via INTRAVENOUS

## 2020-08-15 MED ORDER — DEXAMETHASONE SODIUM PHOSPHATE 4 MG/ML IJ SOLN
INTRAMUSCULAR | Status: DC | PRN
  Administered 2020-08-15: 5 mg via INTRAVENOUS

## 2020-08-15 MED ORDER — OXYCODONE HCL 5 MG/5ML PO SOLN: 5.0000 mg | Freq: Once | ORAL | Status: AC | PRN

## 2020-08-15 MED ORDER — PROPOFOL 10 MG/ML IV BOLUS
INTRAVENOUS | Status: DC | PRN
  Administered 2020-08-15: 180 mg via INTRAVENOUS

## 2020-08-15 MED ORDER — ACETAMINOPHEN 500 MG PO TABS
ORAL_TABLET | ORAL | Status: AC
  Filled 2020-08-15: qty 2

## 2020-08-15 MED ORDER — CEFAZOLIN IN SODIUM CHLORIDE 3-0.9 GM/100ML-% IV SOLN
INTRAVENOUS | Status: AC
  Filled 2020-08-15: qty 100

## 2020-08-15 MED ORDER — OXYCODONE HCL 5 MG PO TABS
ORAL_TABLET | ORAL | Status: AC
  Filled 2020-08-15: qty 1

## 2020-08-15 MED ORDER — ONDANSETRON HCL 4 MG PO TABS: 4.0000 mg | ORAL_TABLET | Freq: Three times a day (TID) | ORAL | 0 refills | Status: AC | PRN

## 2020-08-15 MED ORDER — BUPIVACAINE LIPOSOME 1.3 % IJ SUSP
INTRAMUSCULAR | Status: DC | PRN
  Administered 2020-08-15: 10 mL via PERINEURAL

## 2020-08-15 MED ORDER — ONDANSETRON HCL 4 MG/2ML IJ SOLN
INTRAMUSCULAR | Status: AC
  Filled 2020-08-15: qty 2

## 2020-08-15 MED ORDER — OXYCODONE HCL 5 MG PO TABS: ORAL_TABLET | ORAL | 0 refills | Status: AC

## 2020-08-15 MED ORDER — MIDAZOLAM HCL 2 MG/2ML IJ SOLN
2.0000 mg | Freq: Once | INTRAMUSCULAR | Status: AC
  Administered 2020-08-15: 1 mg via INTRAVENOUS

## 2020-08-15 SURGICAL SUPPLY — 60 items
AID PSTN UNV HD RSTRNT DISP (MISCELLANEOUS) ×2
APL PRP STRL LF DISP 70% ISPRP (MISCELLANEOUS) ×2
BLADE EXCALIBUR 4.0X13 (MISCELLANEOUS) ×3 IMPLANT
BURR OVAL 8 FLU 4.0X13 (MISCELLANEOUS) ×3 IMPLANT
CANNULA 5.75X71 LONG (CANNULA) IMPLANT
CANNULA PASSPORT 5 (CANNULA) ×3 IMPLANT
CANNULA PASSPORT BUTTON 10-40 (CANNULA) IMPLANT
CANNULA TWIST IN 8.25X7CM (CANNULA) IMPLANT
CHLORAPREP W/TINT 26 (MISCELLANEOUS) ×3 IMPLANT
CLSR STERI-STRIP ANTIMIC 1/2X4 (GAUZE/BANDAGES/DRESSINGS) ×3 IMPLANT
COOLER ICEMAN CLASSIC (MISCELLANEOUS) ×3 IMPLANT
COVER WAND RF STERILE (DRAPES) IMPLANT
DRAPE IMP U-DRAPE 54X76 (DRAPES) ×6 IMPLANT
DRAPE INCISE IOBAN 66X45 STRL (DRAPES) IMPLANT
DRAPE SHOULDER BEACH CHAIR (DRAPES) ×3 IMPLANT
DRSG PAD ABDOMINAL 8X10 ST (GAUZE/BANDAGES/DRESSINGS) ×6 IMPLANT
DW OUTFLOW CASSETTE/TUBE SET (MISCELLANEOUS) ×3 IMPLANT
GAUZE SPONGE 4X4 12PLY STRL (GAUZE/BANDAGES/DRESSINGS) ×3 IMPLANT
GLOVE SRG 8 PF TXTR STRL LF DI (GLOVE) ×2 IMPLANT
GLOVE SURG ENC MOIS LTX SZ6.5 (GLOVE) ×3 IMPLANT
GLOVE SURG LTX SZ8 (GLOVE) ×3 IMPLANT
GLOVE SURG UNDER POLY LF SZ6.5 (GLOVE) IMPLANT
GLOVE SURG UNDER POLY LF SZ7 (GLOVE) ×6 IMPLANT
GLOVE SURG UNDER POLY LF SZ8 (GLOVE) ×3
GOWN STRL REUS W/ TWL LRG LVL3 (GOWN DISPOSABLE) ×2 IMPLANT
GOWN STRL REUS W/TWL LRG LVL3 (GOWN DISPOSABLE) ×3
GOWN STRL REUS W/TWL XL LVL3 (GOWN DISPOSABLE) ×3 IMPLANT
IMPL SPEEDBRIDGE KIT (Orthopedic Implant) ×2 IMPLANT
IMPLANT SPEEDBRIDGE KIT (Orthopedic Implant) ×3 IMPLANT
KIT POSITIONER SHLDR ASSISTARM (MISCELLANEOUS) ×3 IMPLANT
KIT STABILIZATION SHOULDER (MISCELLANEOUS) IMPLANT
KIT STR SPEAR 1.8 FBRTK DISP (KITS) IMPLANT
LASSO 90 CVE QUICKPAS (DISPOSABLE) IMPLANT
LASSO CRESCENT QUICKPASS (SUTURE) ×3 IMPLANT
MANIFOLD NEPTUNE II (INSTRUMENTS) ×3 IMPLANT
NDL SAFETY ECLIPSE 18X1.5 (NEEDLE) ×2 IMPLANT
NEEDLE HYPO 18GX1.5 SHARP (NEEDLE) ×3
NEEDLE SCORPION MULTI FIRE (NEEDLE) IMPLANT
PACK ARTHROSCOPY DSU (CUSTOM PROCEDURE TRAY) ×3 IMPLANT
PACK BASIN DAY SURGERY FS (CUSTOM PROCEDURE TRAY) ×3 IMPLANT
PAD COLD SHLDR WRAP-ON (PAD) ×3 IMPLANT
PAD ORTHO SHOULDER 7X19 LRG (SOFTGOODS) IMPLANT
PORT APPOLLO RF 90DEGREE MULTI (SURGICAL WAND) ×3 IMPLANT
RESTRAINT HEAD UNIVERSAL NS (MISCELLANEOUS) ×3 IMPLANT
SHEET MEDIUM DRAPE 40X70 STRL (DRAPES) ×3 IMPLANT
SLEEVE SCD COMPRESS KNEE MED (STOCKING) ×3 IMPLANT
SLING ARM FOAM STRAP LRG (SOFTGOODS) IMPLANT
SUT FIBERWIRE #2 38 T-5 BLUE (SUTURE)
SUT MNCRL AB 4-0 PS2 18 (SUTURE) ×3 IMPLANT
SUT PDS AB 1 CT  36 (SUTURE) ×1
SUT PDS AB 1 CT 36 (SUTURE) ×2 IMPLANT
SUT TIGER TAPE 7 IN WHITE (SUTURE) IMPLANT
SUTURE FIBERWR #2 38 T-5 BLUE (SUTURE) IMPLANT
SUTURE TAPE TIGERLINK 1.3MM BL (SUTURE) IMPLANT
SUTURETAPE TIGERLINK 1.3MM BL (SUTURE)
SYR 5ML LL (SYRINGE) ×3 IMPLANT
TAPE FIBER 2MM 7IN #2 BLUE (SUTURE) IMPLANT
TOWEL GREEN STERILE FF (TOWEL DISPOSABLE) ×6 IMPLANT
TUBE CONNECTING 20X1/4 (TUBING) ×3 IMPLANT
TUBING ARTHROSCOPY IRRIG 16FT (MISCELLANEOUS) ×3 IMPLANT

## 2020-08-15 NOTE — Anesthesia Procedure Notes (Addendum)
Procedure Name: Intubation Date/Time: 08/15/2020 7:41 AM Performed by: Burna Cash, CRNA Pre-anesthesia Checklist: Patient identified, Emergency Drugs available, Suction available and Patient being monitored Patient Re-evaluated:Patient Re-evaluated prior to induction Oxygen Delivery Method: Circle system utilized Preoxygenation: Pre-oxygenation with 100% oxygen Induction Type: IV induction Ventilation: Mask ventilation without difficulty Laryngoscope Size: Glidescope and 4 Grade View: Grade I Tube type: Oral Tube size: 7.5 mm Number of attempts: 1 Airway Equipment and Method: Stylet and Oral airway Placement Confirmation: ETT inserted through vocal cords under direct vision, positive ETCO2 and breath sounds checked- equal and bilateral Secured at: 22 cm Tube secured with: Tape Dental Injury: Teeth and Oropharynx as per pre-operative assessment

## 2020-08-15 NOTE — Anesthesia Procedure Notes (Addendum)
Anesthesia Regional Block: Interscalene brachial plexus block   Pre-Anesthetic Checklist: , timeout performed,  Correct Patient, Correct Site, Correct Laterality,  Correct Procedure, Correct Position, site marked,  Risks and benefits discussed,  Pre-op evaluation,  At surgeon's request and post-op pain management  Laterality: Right  Prep: Maximum Sterile Barrier Precautions used, chloraprep       Needles:  Injection technique: Single-shot  Needle Type: Echogenic Stimulator Needle     Needle Length: 4cm  Needle Gauge: 22     Additional Needles:   Procedures:,,,, ultrasound used (permanent image in chart),,    Narrative:  Start time: 08/15/2020 7:10 AM End time: 08/15/2020 7:13 AM Injection made incrementally with aspirations every 5 mL.  Performed by: Personally  Anesthesiologist: Kaylyn Layer, MD  Additional Notes: Risks, benefits, and alternative discussed. Patient gave consent for procedure. Patient prepped and draped in sterile fashion. Sedation administered, patient remains easily responsive to voice. Relevant anatomy identified with ultrasound guidance. Local anesthetic given in 5cc increments with no signs or symptoms of intravascular injection. No pain or paraesthesias with injection. Patient monitored throughout procedure with signs of LAST or immediate complications. Tolerated well. Ultrasound image placed in chart.  Isaac Greenhouse, MD

## 2020-08-15 NOTE — Anesthesia Postprocedure Evaluation (Signed)
Anesthesia Post Note  Patient: Isaac Valdez  Procedure(s) Performed: SHOULDER ARTHROSCOPY WITH SUBACROMIAL DECOMPRESSION AND DISTAL CLAVICLE EXCISION (Right: Shoulder) SHOULDER ARTHROSCOPY WITH BICEPSTENOTOMY (Right: Shoulder) SHOULDER ARTHROSCOPY WITH ARTHROSCOPIC ROTATOR CUFF REPAIR (Right: Shoulder)     Patient location during evaluation: PACU Anesthesia Type: General Level of consciousness: awake and alert and oriented Pain management: pain level controlled Vital Signs Assessment: post-procedure vital signs reviewed and stable Respiratory status: spontaneous breathing, nonlabored ventilation and respiratory function stable Cardiovascular status: blood pressure returned to baseline Postop Assessment: no apparent nausea or vomiting Anesthetic complications: no   No notable events documented.  Last Vitals:  Vitals:   08/15/20 1000 08/15/20 1107  BP: (!) 180/70 (!) 173/53  Pulse: 63 60  Resp: 18 16  Temp:  36.7 C  SpO2: 93% 96%             Kaylyn Layer

## 2020-08-15 NOTE — Transfer of Care (Signed)
Immediate Anesthesia Transfer of Care Note  Patient: Isaac Valdez  Procedure(s) Performed: SHOULDER ARTHROSCOPY WITH SUBACROMIAL DECOMPRESSION AND DISTAL CLAVICLE EXCISION (Right: Shoulder) SHOULDER ARTHROSCOPY WITH BICEPSTENOTOMY (Right: Shoulder) SHOULDER ARTHROSCOPY WITH ARTHROSCOPIC ROTATOR CUFF REPAIR (Right: Shoulder)  Patient Location: PACU  Anesthesia Type:GA combined with regional for post-op pain  Level of Consciousness: sedated  Airway & Oxygen Therapy: Patient Spontanous Breathing and Patient connected to face mask oxygen  Post-op Assessment: Report given to RN and Post -op Vital signs reviewed and stable  Post vital signs: Reviewed and stable  Last Vitals:  Vitals Value Taken Time  BP 191/87 08/15/20 0915  Temp 36.3 C 08/15/20 0915  Pulse 63 08/15/20 0919  Resp 21 08/15/20 0919  SpO2 98 % 08/15/20 0919  Vitals shown include unvalidated device data.  Last Pain:  Vitals:   08/15/20 0633  TempSrc: Oral  PainSc: 0-No pain      Patients Stated Pain Goal: 3 (08/15/20 1610)  Complications: No notable events documented.

## 2020-08-15 NOTE — Progress Notes (Signed)
Assisted Dr. Howze with right, ultrasound guided, interscalene  block. Side rails up, monitors on throughout procedure. See vital signs in flow sheet. Tolerated Procedure well. °

## 2020-08-15 NOTE — Interval H&P Note (Signed)
History and Physical Interval Note:  08/15/2020 7:13 AM  Isaac Valdez  has presented today for surgery, with the diagnosis of RIGHT SHOULDER IMPINGEMENT SYNDROME,OSTEOARTHRITIS,STRAIN OF MUSCLE AND TENDON OF THE ROTATOR CUFF.  The various methods of treatment have been discussed with the patient and family. After consideration of risks, benefits and other options for treatment, the patient has consented to  Procedure(s): IRRIGATION AND DEBRIDEMENT SHOULDER (Right) SHOULDER ARTHROSCOPY WITH DISTAL CLAVICLE RESECTION (Right) SHOULDER ARTHROSCOPY WITH ROTATOR CUFF REPAIR AND SUBACROMIAL DECOMPRESSION (Right) SHOULDER ACROMIOPLASTY (Right) as a surgical intervention.  The patient's history has been reviewed, patient examined, no change in status, stable for surgery.  I have reviewed the patient's chart and labs.  Questions were answered to the patient's satisfaction.     Bjorn Pippin

## 2020-08-15 NOTE — Op Note (Addendum)
Orthopaedic Surgery Operative Note (CSN: [id number])  Isaac Valdez  1960/10/29 Date of Surgery: 08/15/2020   Diagnoses:  Right shoulder rotator cuff tear, AC arthritis and impingement  Procedure: Arthroscopic extensive debridement Arthroscopic subacromial decompression Arthroscopic rotator cuff repair Arthroscopic distal clavicle excision   Operative Finding Exam under anesthesia: Full motion in forward flexion with the patient had limited external rotation to 60 degrees Articular space: No loose bodies, capsule intact, l significant anterior labral fraying, superior labral tear Chondral surfaces:Intact, no sign of chondral degeneration on the glenoid or humeral head Biceps: Type II SLAP tear though the biceps itself was intact Subscapularis: Normal with thickened [facility] L Superior Cuff: Tear in the critical zone of the supraspinatus posteriorly as it combined with the infraspinatus.  It was essentially a type II style tear with the majority of the tendon still left on the tuberosity and a large perforation with retraction of the muscle itself.  This is quite difficult to repair based on its complex nature.  We had to pass sutures through nevaisers portal using a crescent device as the tissue was of to poor quality and too thickened to allow for passage of a scorpion. Bursal side: As above  Successful completion of the planned procedure.  Patient's cuff is at high risk for retear approaching 40% I would estimate.  His joint looked pristine however and he would be a reasonable candidate for superior capsular reconstruction though his diabetes and overall comorbidities may make [place] lean towards a reverse.  This would be a discussion with the patient.  His tear style was such that we cannot predict exactly how he will heal.   Post-operative plan: The patient will be non-weightbearing in a sling.  The patient will be discharged home.  DVT prophylaxis not indicated in ambulatory upper extremity  patient without known risk factors.   Pain control with PRN pain medication preferring oral medicines.  Follow up plan will be scheduled in approximately 7 days for incision check and XR.  Physical therapy to start after 2 to 3 weeks  Post-Op Diagnosis: Same Surgeons:Primary: Hiram Gash, MD Assistants:Caroline McBane PA-C Location: [facility] OR ROOM 6 Anesthesia: General with Exparel interscalene block Antibiotics: Ancef 3 g Tourniquet time: None Estimated Blood Loss: Minimal Complications: None Specimens: None Implants: Implant Name Type Inv. Item Serial No. Manufacturer Lot No. LRB No. Used Action  IMPLANT SPEEDBRIDGE KIT - [id number] Orthopedic Implant IMPLANT SPEEDBRIDGE KIT  [place] [id number] Right 1 Implanted    Indications for Surgery:   Isaac Valdez is a 60 y.o. male with continued shoulder pain refractory to nonoperative measures for extended period of time.  MRI demonstrated a typical cuff tear in the posterior aspect of the supraspinatus.  The risks and benefits were explained at length including but not limited to continued pain, cuff failure, biceps tenodesis failure, stiffness, need for further surgery and infection.   Procedure:   Patient was correctly identified in the preoperative holding area and operative site marked.  Patient brought to OR and positioned beachchair on an Sheldon table ensuring that all bony prominences were padded and the head was in an appropriate location.  Anesthesia was induced and the operative shoulder was prepped and draped in the usual sterile fashion.  Timeout was called preincision.  A standard posterior viewing portal was made after localizing the portal with a spinal needle.  An anterior accessory portal was also made.  After clearing the articular space the camera was positioned in the subacromial space.  Findings above.  Extensive debridement was performed of the anterior interval tissue, labral fraying and the bursa.Biceps tenotomy  performed.  Subacromial decompression: We made a lateral portal with spinal needle guidance. We then proceeded to debride bursal tissue extensively with a shaver and arthrocare device. At that point we continued to identify the borders of the acromion and identify the spur. We then carefully preserved the deltoid fascia and used a burr to convert the acromion to a Type 1 flat acromion without issue.   Distal Clavicle resection:  The scope was placed in the subacromial space from the posterior portal.  A hemostat was placed through the anterior portal and we spread at the [facility] joint.  A burr was then inserted and 10 mm of distal clavicle was resected taking care to avoid damage to the capsule around the joint and avoiding overhanging bone posteriorly.    Rotator cuff repair: The atypical tear was noted to have leftmost of the tendon on the tuberosity.  We unfortunately were not able to utilize this tendon as it would not of appropriately healed.  We instead debrided the tendon that was still attached to the tuberosity down to a bleeding bony surface preparing the bone for healing.  We made marrow vent holes as is described by the [facility] group to try and aid with healing.  At that point were able to place 2 medial 4.75 bio composite swivel locks and passed the fiber tapes through the tissue.  We had to utilize a penetrating BirdBeak type device as well as a crescent through nevaisors portal as the tissue quality was so poor and thickened that it was not appropriate for scorpion passage.  We are able to get reasonable purchase of the tissue and placed our lateral row of similar swivel lock anchors into the lateral cortex 8 to 10 mm below the tip of the tuberosity.  We had some slack in one of our posterior sutures and passed one of our safety stitches from the anterior swivel lock around this to tension the suture appropriately and tied alternating half hitches.  This completed the speed bridge type repair.  The  incisions were closed with absorbable monocryl and steri strips.  A sterile dressing was placed along with a sling. The patient was awoken from general anesthesia and taken to the PACU in stable condition without complication.   Noemi Chapel, PA-C, present and scrubbed throughout the case, critical for completion in a timely fashion, and for retraction, instrumentation, closure.

## 2020-08-16 ENCOUNTER — Encounter (HOSPITAL_BASED_OUTPATIENT_CLINIC_OR_DEPARTMENT_OTHER): Payer: Self-pay | Admitting: Orthopaedic Surgery

## 2021-01-03 ENCOUNTER — Other Ambulatory Visit: Payer: Self-pay | Admitting: Orthopaedic Surgery

## 2021-01-03 ENCOUNTER — Other Ambulatory Visit: Payer: Self-pay

## 2021-01-03 ENCOUNTER — Ambulatory Visit (HOSPITAL_COMMUNITY)
Admission: RE | Admit: 2021-01-03 | Discharge: 2021-01-03 | Disposition: A | Discharge status: Home / Self Care | Payer: Medicare Other | Source: Ambulatory Visit | Attending: Cardiovascular Disease | Admitting: Cardiovascular Disease

## 2021-01-03 ENCOUNTER — Other Ambulatory Visit (HOSPITAL_COMMUNITY): Payer: Self-pay | Admitting: Orthopaedic Surgery

## 2021-01-03 DIAGNOSIS — M7989 Other specified soft tissue disorders: Secondary | ICD-10-CM | POA: Insufficient documentation

## 2021-01-03 DIAGNOSIS — M79604 Pain in right leg: Secondary | ICD-10-CM

## 2021-04-15 ENCOUNTER — Other Ambulatory Visit: Payer: Self-pay | Admitting: Student

## 2021-05-19 ENCOUNTER — Other Ambulatory Visit: Payer: Self-pay | Admitting: Student

## 2021-05-19 NOTE — Telephone Encounter (Signed)
Followed by Barry/Camnitz ?

## 2021-07-15 ENCOUNTER — Other Ambulatory Visit: Payer: Self-pay

## 2021-07-15 ENCOUNTER — Emergency Department (HOSPITAL_BASED_OUTPATIENT_CLINIC_OR_DEPARTMENT_OTHER)
Admission: EM | Admit: 2021-07-15 | Discharge: 2021-07-16 | Disposition: A | Discharge status: Home / Self Care | Payer: Medicare Other | Attending: Emergency Medicine | Admitting: Emergency Medicine

## 2021-07-15 ENCOUNTER — Encounter (HOSPITAL_BASED_OUTPATIENT_CLINIC_OR_DEPARTMENT_OTHER): Payer: Self-pay

## 2021-07-15 DIAGNOSIS — I110 Hypertensive heart disease with heart failure: Secondary | ICD-10-CM | POA: Diagnosis not present

## 2021-07-15 DIAGNOSIS — Z79899 Other long term (current) drug therapy: Secondary | ICD-10-CM | POA: Diagnosis not present

## 2021-07-15 DIAGNOSIS — F17290 Nicotine dependence, other tobacco product, uncomplicated: Secondary | ICD-10-CM | POA: Diagnosis not present

## 2021-07-15 DIAGNOSIS — I509 Heart failure, unspecified: Secondary | ICD-10-CM | POA: Insufficient documentation

## 2021-07-15 DIAGNOSIS — E119 Type 2 diabetes mellitus without complications: Secondary | ICD-10-CM | POA: Diagnosis not present

## 2021-07-15 DIAGNOSIS — M109 Gout, unspecified: Secondary | ICD-10-CM | POA: Diagnosis not present

## 2021-07-15 DIAGNOSIS — Z7984 Long term (current) use of oral hypoglycemic drugs: Secondary | ICD-10-CM | POA: Insufficient documentation

## 2021-07-15 DIAGNOSIS — M25562 Pain in left knee: Secondary | ICD-10-CM | POA: Diagnosis present

## 2021-07-15 NOTE — ED Provider Notes (Signed)
? ?[facility] ?Provider Note: Georgena Spurling, MD, [place] ? ?CSN: [id number] ?MRN: [id number] ?ARRIVAL: 07/15/21 at 2343 ?ROOM: MH09/MH09 ? ? ?CHIEF COMPLAINT  ?Knee Pain ? ? ?HISTORY OF PRESENT ILLNESS  ?07/15/21 11:54 PM ?Isaac Valdez is a 61 y.o. male with a history of gout.  He is currently out of probenecid and colchicine.  He is here with about a 3-day history of pain in his knees.  It began in his left knee and is now involving both knees.  He rates his pain as an 8 out of 10, worse with movement.  There is no erythema or warmth.  He denies injury. ? ? ?Past Medical History:  ?Diagnosis Date  ? Arthritis   ? Back pain   ? CHF (congestive heart failure) ([place])   ? Diabetes mellitus   ? Dyspnea   ? GERD (gastroesophageal reflux disease)   ? Gout   ? High cholesterol   ? Hypertension   ? Sleep apnea   ? uses BIPAP nightly  ? ? ?Past Surgical History:  ?Procedure Laterality Date  ? A-FLUTTER ABLATION N/A 04/21/2017  ? Procedure: A-FLUTTER ABLATION;  Surgeon: Constance Haw, MD;  Location: [facility];  Service: Cardiovascular;  Laterality: N/A;  ? BACK SURGERY    ? MANDIBLE FRACTURE SURGERY    ? SHOULDER ARTHROSCOPY WITH BICEPSTENOTOMY Right 08/15/2020  ? Procedure: SHOULDER ARTHROSCOPY WITH BICEPSTENOTOMY;  Surgeon: Hiram Gash, MD;  Location: [facility];  Service: Orthopedics;  Laterality: Right;  ? SHOULDER ARTHROSCOPY WITH ROTATOR CUFF REPAIR Right 08/15/2020  ? Procedure: SHOULDER ARTHROSCOPY WITH ARTHROSCOPIC ROTATOR CUFF REPAIR;  Surgeon: Hiram Gash, MD;  Location: [facility];  Service: Orthopedics;  Laterality: Right;  ? ? ?Family History  ?Problem Relation Age of Onset  ? Heart disease Mother   ? Hypertension Sister   ? Other Father   ?     hypothermia  ? Heart failure Son   ? ? ?Social History  ? ?Tobacco Use  ? Smoking status: Some Days  ?  Packs/day: 0.50  ?  Types: Cigars, Cigarettes  ? Smokeless tobacco: Never  ? Tobacco comments:  ?  occais cigar   ?Vaping Use  ? Vaping Use: Never used  ?Substance Use Topics  ? Alcohol use: Yes  ?  Comment: social  ? Drug use: No  ? ? ?Prior to Admission medications   ?Medication Sig Start Date End Date Taking? Authorizing Provider  ?colchicine 0.6 MG tablet Take as directed for gout flare. 07/16/21  Yes Nevayah Faust, MD  ?meloxicam (MOBIC) 15 MG tablet Take 1 tablet daily as needed for joint pain 07/16/21  Yes Jobin Montelongo, MD  ?amLODipine (NORVASC) 5 MG tablet Take 5 mg by mouth daily.    [provider]  ?atorvastatin (LIPITOR) 80 MG tablet Take 80 mg by mouth at bedtime. 12/10/18   [provider]  ?carvedilol (COREG) 25 MG tablet Take 1 tablet by mouth twice daily 05/19/21   Camnitz, Ocie Doyne, MD  ?clonazePAM (KLONOPIN) 0.5 MG tablet Take 1 tablet (0.5 mg total) by mouth as needed. 08/15/20   Ethelda Chick, PA-C  ?cloNIDine (CATAPRES) 0.1 MG tablet as needed. 12/20/19   [provider]  ?famotidine (PEPCID) 40 MG tablet Take 40 mg by mouth daily. 11/09/19   [provider]  ?glipiZIDE (GLUCOTROL) 5 MG tablet Take 5 mg by mouth daily as needed (CBG >120).  08/12/16   [provider]  ?  probenecid (BENEMID) 500 MG tablet Take 1 tablet twice daily for 7 days as needed for gout flare. 07/16/21   Elfida Shimada, MD  ?terbinafine (LAMISIL) 1 % cream as needed. 03/27/20   [provider]  ?torsemide (DEMADEX) 20 MG tablet Take 20 mg by mouth at bedtime.    [provider]  ? ? ?Allergies ?Aspirin ? ? ?REVIEW OF SYSTEMS  ?Negative except as noted here or in the History of Present Illness. ? ? ?PHYSICAL EXAMINATION  ?Initial Vital Signs ?Pulse 78, temperature 98.3 ?F (36.8 ?C), temperature source Oral, resp. rate 18, height 5\' 11"  (1.803 m), weight 127 kg, SpO2 99 %. ? ?Examination ?General: Well-developed, well-nourished male in no acute distress; appearance consistent with age of record ?HENT: normocephalic; atraumatic ?Eyes: Normal appearance ?Neck: supple ?Heart: regular  rate and rhythm ?Lungs: clear to auscultation bilaterally ?Abdomen: soft; nondistended; nontender; bowel sounds present ?Extremities: No deformity; tenderness of knees with pain on movement, no erythema or warmth ?Neurologic: Awake, alert and oriented; motor function intact in all extremities and symmetric; no facial droop ?Skin: Warm and dry ?Psychiatric: Normal mood and affect ? ? ?RESULTS  ?Summary of this visit's results, reviewed and interpreted by myself: ? ? EKG Interpretation ? ?Date/Time:    ?Ventricular Rate:    ?PR Interval:    ?QRS Duration:   ?QT Interval:    ?QTC Calculation:   ?R Axis:     ?Text Interpretation:   ?  ? ?  ? ?Laboratory Studies: ?No results found for this or any previous visit (from the past 24 hour(s)). ?Imaging Studies: ?No results found. ? ?ED COURSE and MDM  ?Nursing notes, initial and subsequent vitals signs, including pulse oximetry, reviewed and interpreted by myself. ? ?Vitals:  ? 07/15/21 2349 07/15/21 2351 07/15/21 2354  ?BP:   (!) 162/67  ?Pulse:  78   ?Resp:  18   ?Temp:  98.3 ?F (36.8 ?C)   ?TempSrc:  Oral   ?SpO2:  99%   ?Weight: 127 kg    ?Height: 5\' 11"  (1.803 m)    ? ?Medications  ?ketorolac (TORADOL) 30 MG/ML injection 30 mg (has no administration in time range)  ?colchicine tablet 0.6 mg (has no administration in time range)  ? ? ?The patient's presentation is most consistent with gout as he has a history of this.  There is no erythema or warmth to suggest septic joints.  I do not appreciate a significant effusion and do not believe that arthrocentesis would be beneficial at this time.  We will refill his probenecid and colchicine. ? ?PROCEDURES  ?Procedures ? ? ?ED DIAGNOSES  ? ?  ICD-10-CM   ?1. Acute gout of left knee, unspecified cause  M10.9   ?  ?2. Acute gout of right knee, unspecified cause  M10.9   ?  ? ? ? ?  ?Taquisha Phung, MD ?07/16/21 0011 ? ?

## 2021-07-15 NOTE — ED Triage Notes (Addendum)
Pt reports swelling and pain to bilateral knees; onset yesterday. Denies chest pain/SOB. Denies swelling to lower legs, ankles or feet. Hx of CHF. ?

## 2021-07-16 MED ORDER — COLCHICINE 0.6 MG PO TABS: ORAL_TABLET | ORAL | 1 refills | Status: DC

## 2021-07-16 MED ORDER — KETOROLAC TROMETHAMINE 30 MG/ML IJ SOLN
30.0000 mg | Freq: Once | INTRAMUSCULAR | Status: AC
  Administered 2021-07-16: 30 mg via INTRAMUSCULAR
  Filled 2021-07-16: qty 1

## 2021-07-16 MED ORDER — COLCHICINE 0.6 MG PO TABS
0.6000 mg | ORAL_TABLET | Freq: Once | ORAL | Status: AC
  Administered 2021-07-16: 0.6 mg via ORAL
  Filled 2021-07-16: qty 1

## 2021-07-16 MED ORDER — MELOXICAM 15 MG PO TABS: ORAL_TABLET | ORAL | 0 refills | Status: DC

## 2021-07-16 MED ORDER — PROBENECID 500 MG PO TABS: ORAL_TABLET | ORAL | 2 refills | Status: DC

## 2022-01-18 ENCOUNTER — Emergency Department (HOSPITAL_BASED_OUTPATIENT_CLINIC_OR_DEPARTMENT_OTHER)
Admission: EM | Admit: 2022-01-18 | Discharge: 2022-01-18 | Disposition: A | Discharge status: Home / Self Care | Payer: Medicare Other | Attending: Emergency Medicine | Admitting: Emergency Medicine

## 2022-01-18 ENCOUNTER — Other Ambulatory Visit: Payer: Self-pay

## 2022-01-18 ENCOUNTER — Encounter (HOSPITAL_BASED_OUTPATIENT_CLINIC_OR_DEPARTMENT_OTHER): Payer: Self-pay | Admitting: Emergency Medicine

## 2022-01-18 DIAGNOSIS — I10 Essential (primary) hypertension: Secondary | ICD-10-CM | POA: Diagnosis not present

## 2022-01-18 DIAGNOSIS — M25521 Pain in right elbow: Secondary | ICD-10-CM | POA: Diagnosis present

## 2022-01-18 DIAGNOSIS — Z79899 Other long term (current) drug therapy: Secondary | ICD-10-CM | POA: Diagnosis not present

## 2022-01-18 DIAGNOSIS — E119 Type 2 diabetes mellitus without complications: Secondary | ICD-10-CM | POA: Diagnosis not present

## 2022-01-18 DIAGNOSIS — M109 Gout, unspecified: Secondary | ICD-10-CM | POA: Insufficient documentation

## 2022-01-18 DIAGNOSIS — Z7984 Long term (current) use of oral hypoglycemic drugs: Secondary | ICD-10-CM | POA: Diagnosis not present

## 2022-01-18 MED ORDER — PREDNISONE 10 MG (21) PO TBPK: ORAL_TABLET | Freq: Every day | ORAL | 0 refills | Status: DC

## 2022-01-18 MED ORDER — KETOROLAC TROMETHAMINE 15 MG/ML IJ SOLN
15.0000 mg | Freq: Once | INTRAMUSCULAR | Status: AC
  Administered 2022-01-18: 15 mg via INTRAMUSCULAR

## 2022-01-18 MED ORDER — PROBENECID 500 MG PO TABS: ORAL_TABLET | ORAL | 2 refills | Status: DC

## 2022-01-18 MED ORDER — COLCHICINE 0.6 MG PO TABS
0.6000 mg | ORAL_TABLET | Freq: Once | ORAL | Status: AC
  Administered 2022-01-18: 0.6 mg via ORAL
  Filled 2022-01-18: qty 1

## 2022-01-18 MED ORDER — KETOROLAC TROMETHAMINE 15 MG/ML IJ SOLN
15.0000 mg | Freq: Once | INTRAMUSCULAR | Status: DC
  Filled 2022-01-18: qty 1

## 2022-01-18 NOTE — ED Provider Notes (Signed)
[facility] EMERGENCY DEPARTMENT Provider Note   CSN: [id number] Arrival date & time: 01/18/22  3474     History  Chief Complaint  Patient presents with   Extremity Pain    Isaac Valdez is a 61 y.o. male with past medical history significant for hypertension, hyperlipidemia, diabetes, who presents with right greater than left gout flare, with pain additionally in bilateral elbows. Reports difficulty walking. Takes medication for gout at home but is inconsistent due to difficulty affording medication. Reports he has been drinking alcohol, processed food diet. Reports blood sugar well controlled on diet alone at home.    Extremity Pain       Home Medications Prior to Admission medications   Medication Sig Start Date End Date Taking? Authorizing Provider  predniSONE (STERAPRED UNI-PAK 21 TAB) 10 MG (21) TBPK tablet Take by mouth daily. Take 6 tabs by mouth daily  for 2 days, then 5 tabs for 2 days, then 4 tabs for 2 days, then 3 tabs for 2 days, 2 tabs for 2 days, then 1 tab by mouth daily for 2 days 01/18/22  Yes Jsoeph Podesta H, PA-C  amLODipine (NORVASC) 5 MG tablet Take 5 mg by mouth daily.    [provider]  atorvastatin (LIPITOR) 80 MG tablet Take 80 mg by mouth at bedtime. 12/10/18   [provider]  carvedilol (COREG) 25 MG tablet Take 1 tablet by mouth twice daily 05/19/21   Camnitz, Andree Coss, MD  clonazePAM (KLONOPIN) 0.5 MG tablet Take 1 tablet (0.5 mg total) by mouth as needed. 08/15/20   McBane, Jerald Kief, PA-C  cloNIDine (CATAPRES) 0.1 MG tablet as needed. 12/20/19   [provider]  colchicine 0.6 MG tablet Take as directed for gout flare. 07/16/21   Molpus, John, MD  famotidine (PEPCID) 40 MG tablet Take 40 mg by mouth daily. 11/09/19   [provider]  glipiZIDE (GLUCOTROL) 5 MG tablet Take 5 mg by mouth daily as needed (CBG >120).  08/12/16   [provider]  meloxicam (MOBIC) 15 MG tablet Take 1 tablet  daily as needed for joint pain 07/16/21   Molpus, John, MD  probenecid (BENEMID) 500 MG tablet Take 1 tablet twice daily for 7 days as needed for gout flare. 01/18/22   Anwen Cannedy H, PA-C  terbinafine (LAMISIL) 1 % cream as needed. 03/27/20   [provider]  torsemide (DEMADEX) 20 MG tablet Take 20 mg by mouth at bedtime.    [provider]      Allergies    Aspirin    Review of Systems   Review of Systems  All other systems reviewed and are negative.   Physical Exam Updated Vital Signs BP (!) 148/64   Pulse 77   Temp 98.2 F (36.8 C) (Oral)   Resp 18   Ht 5\' 11"  (1.803 m)   Wt 114.3 kg   SpO2 98%   BMI 35.15 kg/m  Physical Exam Vitals and nursing note reviewed.  Constitutional:      General: He is not in acute distress.    Appearance: Normal appearance.  HENT:     Head: Normocephalic and atraumatic.  Eyes:     General:        Right eye: No discharge.        Left eye: No discharge.  Cardiovascular:     Rate and Rhythm: Normal rate and regular rhythm.  Pulmonary:     Effort: Pulmonary effort is normal. No respiratory distress.  Musculoskeletal:        General: No deformity.     Comments: Some soft tissue swelling, redness around bilateral elbows, left knee, right knee with tenderness to palpation.  Decreased range of motion passively secondary to pain but intact active range of motion.  Intact strength 5/5 bilateral upper and lower extremities although patient with poor effort throughout.  Skin:    General: Skin is warm and dry.  Neurological:     Mental Status: He is alert and oriented to person, place, and time.  Psychiatric:        Mood and Affect: Mood normal.        Behavior: Behavior normal.     ED Results / Procedures / Treatments   Labs (all labs ordered are listed, but only abnormal results are displayed) Labs Reviewed - No data to display  EKG None  Radiology No results found.  Procedures Procedures    Medications  Ordered in ED Medications  ketorolac (TORADOL) 15 MG/ML injection 15 mg (has no administration in time range)  colchicine tablet 0.6 mg (has no administration in time range)    ED Course/ Medical Decision Making/ A&P                           Medical Decision Making Risk Prescription drug management.   This is an overall well-appearing 61 year old male who presents with concern for acute gout flare.  Patient with history of same.  He is having some Focal tenderness most prominently over the left knee but with some pain of right knee, bilateral elbows.  There is some redness at the left knee, but normal range of motion, low clinical suspicion for septic arthritis.  Patient with no evidence of overlying skin changes, cuts, excoriations.  Discussed with patient that I recommend that he continue to take his at home prophylactic medications, discontinue alcohol and processed foods, and we will treat with Toradol, colchicine, and discharged with refill for his probenecid as well as a steroid taper.  Patient understands agrees to plan, is discharged in stable condition at this time. Final Clinical Impression(s) / ED Diagnoses Final diagnoses:  Acute gout of left knee, unspecified cause    Rx / DC Orders ED Discharge Orders          Ordered    predniSONE (STERAPRED UNI-PAK 21 TAB) 10 MG (21) TBPK tablet  Daily        01/18/22 0945    probenecid (BENEMID) 500 MG tablet        01/18/22 0945              Teeghan Hammer, Harrel Carina, PA-C 01/18/22 0951    Virgina Norfolk, DO 01/18/22 1100

## 2022-01-18 NOTE — ED Triage Notes (Signed)
Pt arrives pov, to triage in wheelchair, c/o shoulder pain, bilateral elbow pain, RT wrist pain, LT knee pain and bilateral ankle pain that started x 2 days pta. Pt endorses hx of gout

## 2022-01-18 NOTE — ED Notes (Signed)
Pt discharged to home. Discharge instructions have been discussed with patient and/or family members. Pt verbally acknowledges understanding d/c instructions, and endorses comprehension to checkout at registration before leaving.  °

## 2022-03-10 ENCOUNTER — Emergency Department (HOSPITAL_BASED_OUTPATIENT_CLINIC_OR_DEPARTMENT_OTHER): Payer: Medicare Other

## 2022-03-10 ENCOUNTER — Other Ambulatory Visit: Payer: Self-pay

## 2022-03-10 ENCOUNTER — Emergency Department (HOSPITAL_BASED_OUTPATIENT_CLINIC_OR_DEPARTMENT_OTHER)
Admission: EM | Admit: 2022-03-10 | Discharge: 2022-03-10 | Disposition: A | Discharge status: Home / Self Care | Payer: Medicare Other | Attending: Emergency Medicine | Admitting: Emergency Medicine

## 2022-03-10 ENCOUNTER — Encounter (HOSPITAL_BASED_OUTPATIENT_CLINIC_OR_DEPARTMENT_OTHER): Payer: Self-pay | Admitting: *Deleted

## 2022-03-10 DIAGNOSIS — R059 Cough, unspecified: Secondary | ICD-10-CM

## 2022-03-10 DIAGNOSIS — J40 Bronchitis, not specified as acute or chronic: Secondary | ICD-10-CM | POA: Insufficient documentation

## 2022-03-10 DIAGNOSIS — R058 Other specified cough: Secondary | ICD-10-CM | POA: Insufficient documentation

## 2022-03-10 DIAGNOSIS — Z1152 Encounter for screening for COVID-19: Secondary | ICD-10-CM | POA: Diagnosis not present

## 2022-03-10 DIAGNOSIS — I130 Hypertensive heart and chronic kidney disease with heart failure and stage 1 through stage 4 chronic kidney disease, or unspecified chronic kidney disease: Secondary | ICD-10-CM | POA: Insufficient documentation

## 2022-03-10 DIAGNOSIS — I509 Heart failure, unspecified: Secondary | ICD-10-CM | POA: Insufficient documentation

## 2022-03-10 DIAGNOSIS — E1122 Type 2 diabetes mellitus with diabetic chronic kidney disease: Secondary | ICD-10-CM | POA: Insufficient documentation

## 2022-03-10 DIAGNOSIS — Z7984 Long term (current) use of oral hypoglycemic drugs: Secondary | ICD-10-CM | POA: Insufficient documentation

## 2022-03-10 DIAGNOSIS — N189 Chronic kidney disease, unspecified: Secondary | ICD-10-CM | POA: Diagnosis not present

## 2022-03-10 DIAGNOSIS — Z79899 Other long term (current) drug therapy: Secondary | ICD-10-CM | POA: Diagnosis not present

## 2022-03-10 LAB — RESP PANEL BY RT-PCR (RSV, FLU A&B, COVID)  RVPGX2
Influenza A by PCR: NEGATIVE
Influenza B by PCR: NEGATIVE
Resp Syncytial Virus by PCR: NEGATIVE
SARS Coronavirus 2 by RT PCR: NEGATIVE

## 2022-03-10 MED ORDER — CETIRIZINE HCL 10 MG PO TABS: 10.0000 mg | ORAL_TABLET | Freq: Every day | ORAL | 0 refills | Status: DC

## 2022-03-10 MED ORDER — DEXAMETHASONE SODIUM PHOSPHATE 10 MG/ML IJ SOLN
10.0000 mg | Freq: Once | INTRAMUSCULAR | Status: AC
  Administered 2022-03-10: 10 mg via INTRAMUSCULAR
  Filled 2022-03-10: qty 1

## 2022-03-10 MED ORDER — ALBUTEROL SULFATE HFA 108 (90 BASE) MCG/ACT IN AERS: 1.0000 | INHALATION_SPRAY | Freq: Four times a day (QID) | RESPIRATORY_TRACT | 0 refills | Status: DC | PRN

## 2022-03-10 MED ORDER — GUAIFENESIN 100 MG/5ML PO LIQD: 100.0000 mg | ORAL | 0 refills | Status: DC | PRN

## 2022-03-10 NOTE — ED Notes (Signed)
NBP reassessed in Left Arm

## 2022-03-10 NOTE — ED Provider Notes (Signed)
[facility] EMERGENCY DEPARTMENT Provider Note   CSN: [id number] Arrival date & time: 03/10/22  0847     History  Chief Complaint  Patient presents with   Cough    Isaac Valdez is a 62 y.o. male with Hx of CHF, metabolic syndrome, HTN, DMT2, atrial flutter, CKD secondary pulmonary HTN, and GERD presenting to the ED with chief complaint of congestion, productive cough, and sinus pain over the last 2-3 weeks.  Initially seen by UC on 02/24/2022.  Was given an IM steroid shot and a prescription for antibiotics and Tessalon.  Noticed improvement up until 2 to 3 days ago, which patient began developing worsening cough, congestion, URI symptoms.  Denies shortness of breath, chest pain, headache, but does note sinus pressure.  Denies N/V, though continuously coughing up phlegm.  No Hx of asthma or COPD.  Denies fever or chills.  Also notes a rhythmic jerking movement of the right arm, chronic for the last 3 years.  Currently being evaluated and followed by neurology.  The history is provided by the patient and medical records.      Home Medications Prior to Admission medications   Medication Sig Start Date End Date Taking? Authorizing Provider  albuterol (VENTOLIN HFA) 108 (90 Base) MCG/ACT inhaler Inhale 1-2 puffs into the lungs every 6 (six) hours as needed for wheezing or shortness of breath. 03/10/22  Yes Prince Rome, PA-C  cetirizine (ZYRTEC ALLERGY) 10 MG tablet Take 1 tablet (10 mg total) by mouth daily. 03/10/22  Yes Prince Rome, PA-C  guaiFENesin (ROBITUSSIN) 100 MG/5ML liquid Take 5-10 mLs (100-200 mg total) by mouth every 4 (four) hours as needed for cough or to loosen phlegm. 03/10/22  Yes Prince Rome, PA-C  amLODipine (NORVASC) 5 MG tablet Take 5 mg by mouth daily.    [provider]  atorvastatin (LIPITOR) 80 MG tablet Take 80 mg by mouth at bedtime. 12/10/18   [provider]  carvedilol (COREG) 25 MG tablet Take 1 tablet by  mouth twice daily 05/19/21   Camnitz, Ocie Doyne, MD  clonazePAM (KLONOPIN) 0.5 MG tablet Take 1 tablet (0.5 mg total) by mouth as needed. 08/15/20   McBane, Maylene Roes, PA-C  cloNIDine (CATAPRES) 0.1 MG tablet as needed. 12/20/19   [provider]  colchicine 0.6 MG tablet Take as directed for gout flare. 07/16/21   Molpus, John, MD  famotidine (PEPCID) 40 MG tablet Take 40 mg by mouth daily. 11/09/19   [provider]  glipiZIDE (GLUCOTROL) 5 MG tablet Take 5 mg by mouth daily as needed (CBG >120).  08/12/16   [provider]  meloxicam (MOBIC) 15 MG tablet Take 1 tablet daily as needed for joint pain 07/16/21   Molpus, John, MD  predniSONE (STERAPRED UNI-PAK 21 TAB) 10 MG (21) TBPK tablet Take by mouth daily. Take 6 tabs by mouth daily  for 2 days, then 5 tabs for 2 days, then 4 tabs for 2 days, then 3 tabs for 2 days, 2 tabs for 2 days, then 1 tab by mouth daily for 2 days 01/18/22   Prosperi, Christian H, PA-C  probenecid (BENEMID) 500 MG tablet Take 1 tablet twice daily for 7 days as needed for gout flare. 01/18/22   Prosperi, Christian H, PA-C  terbinafine (LAMISIL) 1 % cream as needed. 03/27/20   [provider]  torsemide (DEMADEX) 20 MG tablet Take 20 mg by mouth at bedtime.    [provider]  Allergies    Aspirin    Review of Systems   Review of Systems  HENT:  Positive for congestion and sinus pain.     Physical Exam Updated Vital Signs BP 124/64 (BP Location: Left Arm)   Pulse 63   Temp 97.9 F (36.6 C) (Oral)   Resp 18   Ht 5\' 11"  (1.803 m)   Wt 117.9 kg   SpO2 100%   BMI 36.26 kg/m  Physical Exam Vitals and nursing note reviewed.  Constitutional:      General: He is not in acute distress.    Appearance: He is well-developed.  HENT:     Head: Normocephalic and atraumatic.     Comments: Patent.  Without tonsillar exudate, erythema, or swelling.  Mild uvular swelling without deviation.  Mild erythema posterior oropharynx.     Right Ear: Tympanic membrane, ear canal and external ear normal.     Left Ear: Tympanic membrane, ear canal and external ear normal.     Nose: Congestion and rhinorrhea present.     Mouth/Throat:     Mouth: Mucous membranes are moist.     Pharynx: Oropharynx is clear. No oropharyngeal exudate or posterior oropharyngeal erythema.  Eyes:     Conjunctiva/sclera: Conjunctivae normal.  Neck:     Comments: Very supple on exam, no meningismus or torticollis Cardiovascular:     Rate and Rhythm: Normal rate and regular rhythm.     Heart sounds: No murmur heard. Pulmonary:     Effort: Pulmonary effort is normal. No respiratory distress.     Breath sounds: Normal breath sounds. No stridor. No wheezing, rhonchi or rales.     Comments: CTAB, able to communicate without difficulty, without increased respiratory effort.  Adequate air movement. Chest:     Chest wall: No tenderness.  Abdominal:     General: There is no distension.     Palpations: Abdomen is soft.     Tenderness: There is no abdominal tenderness. There is no guarding.  Musculoskeletal:        General: No swelling.     Cervical back: Neck supple. No rigidity.  Skin:    General: Skin is warm and dry.     Capillary Refill: Capillary refill takes less than 2 seconds.     Coloration: Skin is not jaundiced or pale.  Neurological:     Mental Status: He is alert and oriented to person, place, and time.     Motor: No weakness.  Psychiatric:        Mood and Affect: Mood normal.     ED Results / Procedures / Treatments   Labs (all labs ordered are listed, but only abnormal results are displayed) Labs Reviewed  RESP PANEL BY RT-PCR (RSV, FLU A&B, COVID)  RVPGX2    EKG None  Radiology DG Chest 2 View  Result Date: 03/10/2022 CLINICAL DATA:  Two week history of cough. EXAM: CHEST - 2 VIEW COMPARISON:  10/08/2017 FINDINGS: The heart is upper limits of normal in size. The mediastinal and hilar contours are normal. Stable  atherosclerotic calcifications along the aortic arch. No acute pulmonary process. Mild peribronchial thickening could be related to smoking or bronchitis. No infiltrates or effusions. No pulmonary lesions. No pleural effusions. The bony thorax is intact. Stable degenerative changes involving the thoracic spine. IMPRESSION: No acute cardiopulmonary findings. Mild peribronchial thickening could be related to smoking or bronchitis. Electronically Signed   By: Marijo Sanes M.D.   On: 03/10/2022 09:18    Procedures  Procedures    Medications Ordered in ED Medications  dexamethasone (DECADRON) injection 10 mg (10 mg Intramuscular Given 03/10/22 1008)    ED Course/ Medical Decision Making/ A&P                           Medical Decision Making Amount and/or Complexity of Data Reviewed Radiology: ordered.  Risk OTC drugs. Prescription drug management.   62 y.o. male presents to the ED for concern of Cough   This involves an extensive number of treatment options, and is a complaint that carries with it a high risk of complications and morbidity.  The emergent differential diagnosis prior to evaluation includes, but is not limited to: Bronchitis, pneumonia, acute pharyngitis  This is not an exhaustive differential.   Past Medical History / Co-morbidities / Social History: Hx of CHF, metabolic syndrome, HTN, DMT2, atrial flutter, CKD secondary pulmonary HTN, and GERD Social Determinants of Health include: No PCP, resources provided.  Additional History:  Obtained by chart review.  Notably UC visit from 02/24/22 Symptoms of 3 days at this time Given IM decadron, and Rx for Azithromycin and Tessalon  Lab Tests: I ordered, and personally interpreted labs.  The pertinent results include:   Negative for covid, influenza, and RSV  Imaging Studies: I ordered imaging studies including CXR.   I independently visualized and interpreted imaging which showed bronchitis I agree with the radiologist  interpretation.  ED Course: Presenting with 2-3 days of worsening URI symptoms.  Initially had symptoms two weeks ago, improved, then 2-3 days ago began to worsen. Pt well-appearing on exam.  Pt afebrile without tonsillar exudate, erythema, or swelling.  Low clinical suspicion for strep pharyngitis.  Satting at 99-100% on room air.  Hemodynamically stable.  Good air movement without wheezing or rales.  No accessory muscle use, tripoding, drooling, nasal flaring, or retractions appreciated.  No Hx of asthma or COPD.  CXR negative for pneumonia or pneumothorax, suggestive of bronchitis.  No cervical lymphadenopathy or dysphagia.  Presentation non concerning for PTA or RPA.  No trismus or uvula deviation.  Pt able to drink water in ED without difficulty with intact air way.   Doubt meningitis. Clinical diagnosis of bronchitis likely viral in nature.  Low clinical suspicion for otitis media or otitis externa or mastoiditis at this time.  No abx indicated.  Pt does not appear dehydrated, but did discuss importance of water rehydration.  IM decadron provided significant relief with bronchitis in the past, pt requests this today.  I find this reasonable.  Recommended PCP follow up and conservative symptom management.  No PCP, resources provided.  Robitussin, decongestant, and albuterol sent to pharmacy.  Patient in NAD and in good condition at time of discharge.   Disposition: After consideration the patient's encounter today, I do not feel today's workup suggests an emergent condition requiring admission or immediate intervention beyond what has been performed at this time.  Safe for discharge; instructed to return immediately for worsening symptoms, change in symptoms or any other concerns.  I have reviewed the patients home medicines and have made adjustments as needed.  Discussed course of treatment with the patient, whom demonstrated understanding.  Patient in agreement and has no further questions.    I  discussed this case with my attending physician Dr. Laverta Baltimore, who agreed with the proposed treatment course and cosigned this note.  Attending physician stated agreement with plan or made changes to plan which were implemented.  This chart was dictated using voice recognition software.  Despite best efforts to proofread, errors can occur which can change the documentation meaning.         Final Clinical Impression(s) / ED Diagnoses Final diagnoses:  Cough, unspecified type  Bronchitis    Rx / DC Orders ED Discharge Orders          Ordered    guaiFENesin (ROBITUSSIN) 100 MG/5ML liquid  Every 4 hours PRN        03/10/22 0952    albuterol (VENTOLIN HFA) 108 (90 Base) MCG/ACT inhaler  Every 6 hours PRN        03/10/22 0952    cetirizine (ZYRTEC ALLERGY) 10 MG tablet  Daily        03/10/22 0952              Prince Rome, PA-C [id number]    Long, Wonda Olds, MD 03/12/22 1650

## 2022-03-10 NOTE — Discharge Instructions (Addendum)
You were seen in the emergency department today for productive cough and congestion.  We have tested you for covid, flu, and RSV and these results are pending.  You may check results on mychart.  However, it is still likely that your symptoms are related to a different viral illness.   Your chest x-ray was indicative of bronchitis.  No pneumonia was seen.  Treatment is directed at relieving symptoms.  There is no cure for acute viral infections; it is usually best to let them run their course.  Symptoms usually last around 1-2 weeks, though cough may occasionally linger for up to 3-4 weeks.  As discussed, antibiotics are not effective with viral infections, because the infection is caused by a virus, not by bacteria.   Treatments may include:  Increased fluid intake. Sports drinks offer valuable electrolytes, sugars, and fluids.  Breathing heated mist or steam (vaporizer or shower).  Eating chicken soup or other clear broths, and maintaining good nutrition.  Getting plenty of rest.  Using lozenges, tea with honey, or warm salt water for cough relief/sore throat relief (Cepkaol or Halls lozenges are available over-the-counter) Increasing usage of your inhaler if you have asthma.   Take over-the-counter Benadryl, Zyrtec, or Claritin to decrease sinus secretions, with Mucinex to help break up remaining mucus Continue to alternate between Tylenol and ibuprofen for pain and fever control.  You may return to work 24 hours after your temperature has returned to normal.  Please follow up with your primary care doctor in 5-7 days for recheck of ongoing symptoms.  Resources for to local PCP offices has been provided for you.  Please call to schedule an appointment to establish care.  Return to emergency department for emergent changing or worsening of symptoms.

## 2022-03-10 NOTE — ED Triage Notes (Signed)
Having strong cough and sinus HA, congestion. Onset 2-3weeks ago, rec Steroid injection at Urgent Care

## 2022-03-10 NOTE — ED Notes (Signed)
Alert and oriented 62yo AA male, states has been having a lot of congestion and cough for the past few weeks, was seen at an Urgent Care, where he stated he rec cough medicine and steroid injection. States felt much better due to IM injection of steroids, but is here today for return of symptoms. Noted to have strong congested cough and nasal congestion.

## 2022-03-11 ENCOUNTER — Telehealth (HOSPITAL_BASED_OUTPATIENT_CLINIC_OR_DEPARTMENT_OTHER): Payer: Self-pay | Admitting: Emergency Medicine

## 2022-03-11 MED ORDER — CETIRIZINE HCL 10 MG PO TABS: 10.0000 mg | ORAL_TABLET | Freq: Every day | ORAL | 0 refills | Status: DC

## 2022-03-11 MED ORDER — GUAIFENESIN 100 MG/5ML PO LIQD: 100.0000 mg | ORAL | 0 refills | Status: DC | PRN

## 2022-03-11 MED ORDER — ALBUTEROL SULFATE HFA 108 (90 BASE) MCG/ACT IN AERS: 1.0000 | INHALATION_SPRAY | Freq: Four times a day (QID) | RESPIRATORY_TRACT | 0 refills | Status: DC | PRN

## 2022-03-11 NOTE — Telephone Encounter (Signed)
Patient requesting medicine be called to a different pharmacy.  Will assist with this.

## 2022-04-23 ENCOUNTER — Emergency Department (HOSPITAL_BASED_OUTPATIENT_CLINIC_OR_DEPARTMENT_OTHER)
Admission: EM | Admit: 2022-04-23 | Discharge: 2022-04-23 | Disposition: A | Discharge status: Home / Self Care | Payer: Medicare Other | Attending: Emergency Medicine | Admitting: Emergency Medicine

## 2022-04-23 ENCOUNTER — Other Ambulatory Visit: Payer: Self-pay

## 2022-04-23 ENCOUNTER — Emergency Department (HOSPITAL_BASED_OUTPATIENT_CLINIC_OR_DEPARTMENT_OTHER): Payer: Medicare Other

## 2022-04-23 ENCOUNTER — Encounter (HOSPITAL_BASED_OUTPATIENT_CLINIC_OR_DEPARTMENT_OTHER): Payer: Self-pay | Admitting: Urology

## 2022-04-23 DIAGNOSIS — Z20822 Contact with and (suspected) exposure to covid-19: Secondary | ICD-10-CM | POA: Diagnosis not present

## 2022-04-23 DIAGNOSIS — R059 Cough, unspecified: Secondary | ICD-10-CM | POA: Diagnosis present

## 2022-04-23 DIAGNOSIS — E119 Type 2 diabetes mellitus without complications: Secondary | ICD-10-CM | POA: Diagnosis not present

## 2022-04-23 DIAGNOSIS — J40 Bronchitis, not specified as acute or chronic: Secondary | ICD-10-CM | POA: Insufficient documentation

## 2022-04-23 DIAGNOSIS — I110 Hypertensive heart disease with heart failure: Secondary | ICD-10-CM | POA: Insufficient documentation

## 2022-04-23 DIAGNOSIS — I509 Heart failure, unspecified: Secondary | ICD-10-CM | POA: Diagnosis not present

## 2022-04-23 LAB — RESP PANEL BY RT-PCR (RSV, FLU A&B, COVID)  RVPGX2
Influenza A by PCR: NEGATIVE
Influenza B by PCR: NEGATIVE
Resp Syncytial Virus by PCR: NEGATIVE
SARS Coronavirus 2 by RT PCR: NEGATIVE

## 2022-04-23 MED ORDER — AZITHROMYCIN 250 MG PO TABS: 250.0000 mg | ORAL_TABLET | Freq: Every day | ORAL | 0 refills | Status: DC

## 2022-04-23 MED ORDER — BENZONATATE 100 MG PO CAPS: 100.0000 mg | ORAL_CAPSULE | Freq: Three times a day (TID) | ORAL | 0 refills | Status: DC | PRN

## 2022-04-23 MED ORDER — FLUTICASONE PROPIONATE 50 MCG/ACT NA SUSP: 2.0000 | Freq: Every day | NASAL | 0 refills | Status: DC

## 2022-04-23 NOTE — ED Provider Notes (Signed)
Emergency Department Provider Note   I have reviewed the triage vital signs and the nursing notes.   HISTORY  Chief Complaint flu like symptoms    HPI Isaac Valdez is a 62 y.o. male with past history of diabetes, high cholesterol, hypertension, CHF presents emergency department with cough, congestion, fevers.  Symptoms ongoing for the past 4 days.  He has been taking Robitussin as well as Tylenol and albuterol MDI without significant relief.  He saw the PCP and was tested for COVID and flu which came back negative.  Reports fever at home to 103 in the past week. No CP or SOB. No abdominal pain, vomiting, or diarrhea. Scratchy throat without pain.    Past Medical History:  Diagnosis Date   Arthritis    Back pain    CHF (congestive heart failure) (HCC)    Diabetes mellitus    Dyspnea    GERD (gastroesophageal reflux disease)    Gout    High cholesterol    Hypertension    Sleep apnea    uses BIPAP nightly    Review of Systems  Constitutional: Positive fever/chills Eyes: No visual changes. ENT: No sore throat. Positive congestion.  Cardiovascular: Denies chest pain. Respiratory: Denies shortness of breath. Positive cough.  Gastrointestinal: No abdominal pain.  Musculoskeletal: Negative for back pain. Skin: Negative for rash. Neurological: Negative for headaches.   ____________________________________________   PHYSICAL EXAM:  VITAL SIGNS: ED Triage Vitals  Enc Vitals Group     BP 04/23/22 1902 103/72     Pulse Rate 04/23/22 1902 78     Resp 04/23/22 1902 18     Temp 04/23/22 1902 98.4 F (36.9 C)     Temp Source 04/23/22 1902 Oral     SpO2 04/23/22 1902 99 %     Weight 04/23/22 1901 259 lb 14.8 oz (117.9 kg)     Height 04/23/22 1901 5' 11"$  (1.803 m)   Constitutional: Alert and oriented. Well appearing and in no acute distress. Eyes: Conjunctivae are normal.  Head: Atraumatic. Nose: No congestion/rhinnorhea. Mouth/Throat: Mucous membranes are  moist.  Oropharynx non-erythematous. Neck: No stridor. Cardiovascular: Normal rate, regular rhythm. Good peripheral circulation. Grossly normal heart sounds.   Respiratory: Normal respiratory effort.  No retractions. Lungs with faint end-expiratory wheezing but good air movement.  Gastrointestinal: Soft and nontender. No distention.  Musculoskeletal: No lower extremity tenderness nor edema. No gross deformities of extremities. Neurologic:  Normal speech and language. No gross focal neurologic deficits are appreciated.  Skin:  Skin is warm, dry and intact. No rash noted.  ____________________________________________   LABS (all labs ordered are listed, but only abnormal results are displayed)  Labs Reviewed  RESP PANEL BY RT-PCR (RSV, FLU A&B, COVID)  RVPGX2   ____________________________________________  RADIOLOGY  DG Chest 2 View  Result Date: 04/23/2022 CLINICAL DATA:  Cough and fever EXAM: CHEST - 2 VIEW COMPARISON:  03/10/2022 FINDINGS: The heart size and mediastinal contours are within normal limits. Aortic atherosclerosis. Both lungs are clear. Degenerative changes of the spine. IMPRESSION: No active cardiopulmonary disease. Electronically Signed   By: Donavan Foil M.D.   On: 04/23/2022 21:07    ____________________________________________   PROCEDURES  Procedure(s) performed:   Procedures  None  ____________________________________________   INITIAL IMPRESSION / ASSESSMENT AND PLAN / ED COURSE  Pertinent labs & imaging results that were available during my care of the patient were reviewed by me and considered in my medical decision making (see chart for details).   This  patient is Presenting for Evaluation of flu-like symptoms, which does require a range of treatment options, and is a complaint that involves a moderate risk of morbidity and mortality.  The Differential Diagnoses include COVID, Flu, RSV, CAP, CHF, etc.   Clinical Laboratory Tests Ordered,  included COVID and Flu negative. RSV negative.   Radiologic Tests Ordered, included CXR. I independently interpreted the images and agree with radiology interpretation.   Cardiac Monitor Tracing which shows NSR.   Social Determinants of Health Risk patient is a smoker.    Medical Decision Making: Summary:  Presents emergency department for evaluation of cough, congestion, and flulike symptoms over the past 4 days.  COVID and flu along with RSV negative here.  X-ray shows no pulmonary edema, focal infiltrate, other acute abnormality.  Oxygen saturation.  No increased work of breathing.  Plan for continued supportive care.  Will cover for bronchitis given his history and advise close PCP follow-up.  Patient's presentation is most consistent with acute, uncomplicated illness.   Disposition: discharge  ____________________________________________  FINAL CLINICAL IMPRESSION(S) / ED DIAGNOSES  Final diagnoses:  Bronchitis     NEW OUTPATIENT MEDICATIONS STARTED DURING THIS VISIT:  Discharge Medication List as of 04/23/2022  9:34 PM     START taking these medications   Details  azithromycin (ZITHROMAX) 250 MG tablet Take 1 tablet (250 mg total) by mouth daily. Take first 2 tablets together, then 1 every day until finished., Starting Thu 04/23/2022, Normal    benzonatate (TESSALON) 100 MG capsule Take 1 capsule (100 mg total) by mouth 3 (three) times daily as needed for cough., Starting Thu 04/23/2022, Normal    fluticasone (FLONASE) 50 MCG/ACT nasal spray Place 2 sprays into both nostrils daily., Starting Thu 04/23/2022, Normal        Note:  This document was prepared using Dragon voice recognition software and may include unintentional dictation errors.  Nanda Quinton, MD, [facility] Emergency Medicine    Luc Shammas, W[facility] Old[facility], MD 04/23/22 [phone]

## 2022-04-23 NOTE — ED Triage Notes (Signed)
Sinus congestion and chest congestion since Monday Seen at PCP, covid and flu negative Fever at home of 103

## 2022-04-23 NOTE — Discharge Instructions (Signed)
Treating you for bronchitis.  Please pick up your prescriptions this evening and begin taking them.  Instead of Robitussin, try Mucinex.  Follow closely with your primary care physician.

## 2022-11-19 ENCOUNTER — Encounter (HOSPITAL_COMMUNITY): Payer: Self-pay

## 2022-11-19 ENCOUNTER — Other Ambulatory Visit: Payer: Self-pay

## 2022-11-19 ENCOUNTER — Inpatient Hospital Stay (HOSPITAL_COMMUNITY): Payer: Medicare Other

## 2022-11-19 ENCOUNTER — Emergency Department (HOSPITAL_COMMUNITY): Payer: Medicare Other

## 2022-11-19 ENCOUNTER — Inpatient Hospital Stay (HOSPITAL_COMMUNITY)
Admission: EM | Admit: 2022-11-19 | Discharge: 2022-11-28 | DRG: 286 | Disposition: A | Discharge status: Home / Self Care | Payer: Medicare Other | Attending: Family Medicine | Admitting: Family Medicine

## 2022-11-19 DIAGNOSIS — M62838 Other muscle spasm: Secondary | ICD-10-CM | POA: Diagnosis present

## 2022-11-19 DIAGNOSIS — Z6835 Body mass index (BMI) 35.0-35.9, adult: Secondary | ICD-10-CM

## 2022-11-19 DIAGNOSIS — I130 Hypertensive heart and chronic kidney disease with heart failure and stage 1 through stage 4 chronic kidney disease, or unspecified chronic kidney disease: Principal | ICD-10-CM | POA: Diagnosis present

## 2022-11-19 DIAGNOSIS — I480 Paroxysmal atrial fibrillation: Secondary | ICD-10-CM | POA: Diagnosis present

## 2022-11-19 DIAGNOSIS — R634 Abnormal weight loss: Secondary | ICD-10-CM | POA: Diagnosis present

## 2022-11-19 DIAGNOSIS — K219 Gastro-esophageal reflux disease without esophagitis: Secondary | ICD-10-CM | POA: Diagnosis present

## 2022-11-19 DIAGNOSIS — F17290 Nicotine dependence, other tobacco product, uncomplicated: Secondary | ICD-10-CM | POA: Diagnosis present

## 2022-11-19 DIAGNOSIS — E1169 Type 2 diabetes mellitus with other specified complication: Secondary | ICD-10-CM

## 2022-11-19 DIAGNOSIS — Z555 Less than a high school diploma: Secondary | ICD-10-CM

## 2022-11-19 DIAGNOSIS — K529 Noninfective gastroenteritis and colitis, unspecified: Secondary | ICD-10-CM | POA: Diagnosis present

## 2022-11-19 DIAGNOSIS — R531 Weakness: Secondary | ICD-10-CM | POA: Diagnosis not present

## 2022-11-19 DIAGNOSIS — Z7984 Long term (current) use of oral hypoglycemic drugs: Secondary | ICD-10-CM

## 2022-11-19 DIAGNOSIS — R195 Other fecal abnormalities: Secondary | ICD-10-CM | POA: Diagnosis present

## 2022-11-19 DIAGNOSIS — M109 Gout, unspecified: Secondary | ICD-10-CM | POA: Diagnosis present

## 2022-11-19 DIAGNOSIS — Z886 Allergy status to analgesic agent status: Secondary | ICD-10-CM

## 2022-11-19 DIAGNOSIS — I5023 Acute on chronic systolic (congestive) heart failure: Secondary | ICD-10-CM | POA: Diagnosis present

## 2022-11-19 DIAGNOSIS — I472 Ventricular tachycardia, unspecified: Secondary | ICD-10-CM | POA: Diagnosis not present

## 2022-11-19 DIAGNOSIS — G4733 Obstructive sleep apnea (adult) (pediatric): Secondary | ICD-10-CM | POA: Diagnosis present

## 2022-11-19 DIAGNOSIS — E876 Hypokalemia: Secondary | ICD-10-CM

## 2022-11-19 DIAGNOSIS — E860 Dehydration: Secondary | ICD-10-CM | POA: Diagnosis present

## 2022-11-19 DIAGNOSIS — K648 Other hemorrhoids: Secondary | ICD-10-CM | POA: Diagnosis present

## 2022-11-19 DIAGNOSIS — N184 Chronic kidney disease, stage 4 (severe): Secondary | ICD-10-CM | POA: Diagnosis present

## 2022-11-19 DIAGNOSIS — I4729 Other ventricular tachycardia: Secondary | ICD-10-CM | POA: Diagnosis present

## 2022-11-19 DIAGNOSIS — K3189 Other diseases of stomach and duodenum: Secondary | ICD-10-CM | POA: Diagnosis present

## 2022-11-19 DIAGNOSIS — Z23 Encounter for immunization: Secondary | ICD-10-CM

## 2022-11-19 DIAGNOSIS — I428 Other cardiomyopathies: Secondary | ICD-10-CM | POA: Diagnosis present

## 2022-11-19 DIAGNOSIS — D120 Benign neoplasm of cecum: Secondary | ICD-10-CM | POA: Diagnosis present

## 2022-11-19 DIAGNOSIS — D123 Benign neoplasm of transverse colon: Secondary | ICD-10-CM | POA: Diagnosis present

## 2022-11-19 DIAGNOSIS — I350 Nonrheumatic aortic (valve) stenosis: Secondary | ICD-10-CM | POA: Diagnosis present

## 2022-11-19 DIAGNOSIS — Z8249 Family history of ischemic heart disease and other diseases of the circulatory system: Secondary | ICD-10-CM

## 2022-11-19 DIAGNOSIS — Z809 Family history of malignant neoplasm, unspecified: Secondary | ICD-10-CM

## 2022-11-19 DIAGNOSIS — Z79899 Other long term (current) drug therapy: Secondary | ICD-10-CM

## 2022-11-19 DIAGNOSIS — D509 Iron deficiency anemia, unspecified: Secondary | ICD-10-CM

## 2022-11-19 DIAGNOSIS — E7800 Pure hypercholesterolemia, unspecified: Secondary | ICD-10-CM | POA: Diagnosis present

## 2022-11-19 DIAGNOSIS — K449 Diaphragmatic hernia without obstruction or gangrene: Secondary | ICD-10-CM | POA: Diagnosis present

## 2022-11-19 DIAGNOSIS — N179 Acute kidney failure, unspecified: Secondary | ICD-10-CM

## 2022-11-19 DIAGNOSIS — R109 Unspecified abdominal pain: Principal | ICD-10-CM

## 2022-11-19 DIAGNOSIS — I5043 Acute on chronic combined systolic (congestive) and diastolic (congestive) heart failure: Secondary | ICD-10-CM | POA: Diagnosis present

## 2022-11-19 DIAGNOSIS — N183 Chronic kidney disease, stage 3 unspecified: Secondary | ICD-10-CM | POA: Diagnosis present

## 2022-11-19 DIAGNOSIS — I5082 Biventricular heart failure: Secondary | ICD-10-CM | POA: Diagnosis present

## 2022-11-19 DIAGNOSIS — E1122 Type 2 diabetes mellitus with diabetic chronic kidney disease: Secondary | ICD-10-CM | POA: Diagnosis present

## 2022-11-19 DIAGNOSIS — I2729 Other secondary pulmonary hypertension: Secondary | ICD-10-CM | POA: Diagnosis present

## 2022-11-19 DIAGNOSIS — F17210 Nicotine dependence, cigarettes, uncomplicated: Secondary | ICD-10-CM | POA: Diagnosis present

## 2022-11-19 DIAGNOSIS — I5021 Acute systolic (congestive) heart failure: Secondary | ICD-10-CM | POA: Diagnosis not present

## 2022-11-19 DIAGNOSIS — I5032 Chronic diastolic (congestive) heart failure: Secondary | ICD-10-CM

## 2022-11-19 DIAGNOSIS — I493 Ventricular premature depolarization: Secondary | ICD-10-CM | POA: Diagnosis present

## 2022-11-19 DIAGNOSIS — E669 Obesity, unspecified: Secondary | ICD-10-CM | POA: Diagnosis present

## 2022-11-19 LAB — CBC WITH DIFFERENTIAL/PLATELET
Abs Immature Granulocytes: 0.02 10*3/uL (ref 0.00–0.07)
Basophils Absolute: 0.1 10*3/uL (ref 0.0–0.1)
Basophils Relative: 1 %
Eosinophils Absolute: 0 10*3/uL (ref 0.0–0.5)
Eosinophils Relative: 1 %
HCT: 36.4 % — ABNORMAL LOW (ref 39.0–52.0)
Hemoglobin: 11.6 g/dL — ABNORMAL LOW (ref 13.0–17.0)
Immature Granulocytes: 1 %
Lymphocytes Relative: 27 %
Lymphs Abs: 1.2 10*3/uL (ref 0.7–4.0)
MCH: 30.2 pg (ref 26.0–34.0)
MCHC: 31.9 g/dL (ref 30.0–36.0)
MCV: 94.8 fL (ref 80.0–100.0)
Monocytes Absolute: 0.5 10*3/uL (ref 0.1–1.0)
Monocytes Relative: 11 %
Neutro Abs: 2.7 10*3/uL (ref 1.7–7.7)
Neutrophils Relative %: 59 %
Platelets: 259 10*3/uL (ref 150–400)
RBC: 3.84 MIL/uL — ABNORMAL LOW (ref 4.22–5.81)
RDW: 17.1 % — ABNORMAL HIGH (ref 11.5–15.5)
WBC: 4.4 10*3/uL (ref 4.0–10.5)
nRBC: 0 % (ref 0.0–0.2)

## 2022-11-19 LAB — COMPREHENSIVE METABOLIC PANEL
ALT: 22 U/L (ref 0–44)
AST: 24 U/L (ref 15–41)
Albumin: 3.7 g/dL (ref 3.5–5.0)
Alkaline Phosphatase: 61 U/L (ref 38–126)
Anion gap: 12 (ref 5–15)
BUN: 29 mg/dL — ABNORMAL HIGH (ref 6–20)
CO2: 19 mmol/L — ABNORMAL LOW (ref 22–32)
Calcium: 8.9 mg/dL (ref 8.9–10.3)
Chloride: 108 mmol/L (ref 98–111)
Creatinine, Ser: 2.25 mg/dL — ABNORMAL HIGH (ref 0.61–1.24)
GFR, Estimated: 33 mL/min — ABNORMAL LOW (ref >60)
Glucose, Bld: 139 mg/dL — ABNORMAL HIGH (ref 70–99)
Potassium: 3.2 mmol/L — ABNORMAL LOW (ref 3.5–5.1)
Sodium: 139 mmol/L (ref 135–145)
Total Bilirubin: 1 mg/dL (ref 0.3–1.2)
Total Protein: 7 g/dL (ref 6.5–8.1)

## 2022-11-19 LAB — MAGNESIUM: Magnesium: 1.6 mg/dL — ABNORMAL LOW (ref 1.7–2.4)

## 2022-11-19 LAB — URINALYSIS, ROUTINE W REFLEX MICROSCOPIC
Bilirubin Urine: NEGATIVE
Glucose, UA: NEGATIVE mg/dL
Ketones, ur: NEGATIVE mg/dL
Leukocytes,Ua: NEGATIVE
Nitrite: NEGATIVE
Protein, ur: 100 mg/dL — AB
Specific Gravity, Urine: 1.013 (ref 1.005–1.030)
pH: 5 (ref 5.0–8.0)

## 2022-11-19 LAB — GLUCOSE, CAPILLARY: Glucose-Capillary: 129 mg/dL — ABNORMAL HIGH (ref 70–99)

## 2022-11-19 LAB — LIPASE, BLOOD: Lipase: 30 U/L (ref 11–51)

## 2022-11-19 MED ORDER — INFLUENZA VIRUS VACC SPLIT PF (FLUZONE) 0.5 ML IM SUSY
0.5000 mL | PREFILLED_SYRINGE | INTRAMUSCULAR | Status: AC
  Administered 2022-11-20: 0.5 mL via INTRAMUSCULAR
  Filled 2022-11-19: qty 0.5

## 2022-11-19 MED ORDER — CARVEDILOL 25 MG PO TABS
25.0000 mg | ORAL_TABLET | Freq: Two times a day (BID) | ORAL | Status: DC
  Administered 2022-11-19 – 2022-11-23 (×8): 25 mg via ORAL
  Filled 2022-11-19 (×3): qty 1
  Filled 2022-11-19: qty 2
  Filled 2022-11-19 (×4): qty 1

## 2022-11-19 MED ORDER — CLONAZEPAM 0.5 MG PO TABS
0.5000 mg | ORAL_TABLET | Freq: Two times a day (BID) | ORAL | Status: DC | PRN
  Administered 2022-11-19 – 2022-11-28 (×17): 0.5 mg via ORAL
  Filled 2022-11-19 (×17): qty 1

## 2022-11-19 MED ORDER — POTASSIUM CHLORIDE 10 MEQ/100ML IV SOLN
10.0000 meq | INTRAVENOUS | Status: AC
  Administered 2022-11-19 (×3): 10 meq via INTRAVENOUS
  Filled 2022-11-19 (×3): qty 100

## 2022-11-19 MED ORDER — PANTOPRAZOLE SODIUM 40 MG PO TBEC
40.0000 mg | DELAYED_RELEASE_TABLET | Freq: Every day | ORAL | Status: DC
  Administered 2022-11-19 – 2022-11-28 (×10): 40 mg via ORAL
  Filled 2022-11-19 (×10): qty 1

## 2022-11-19 MED ORDER — MORPHINE SULFATE (PF) 4 MG/ML IV SOLN
4.0000 mg | Freq: Once | INTRAVENOUS | Status: AC
  Administered 2022-11-19: 4 mg via INTRAVENOUS
  Filled 2022-11-19: qty 1

## 2022-11-19 MED ORDER — ACETAMINOPHEN 650 MG RE SUPP: 650.0000 mg | Freq: Four times a day (QID) | RECTAL | Status: DC | PRN

## 2022-11-19 MED ORDER — LACTATED RINGERS IV BOLUS
500.0000 mL | Freq: Once | INTRAVENOUS | Status: AC
  Administered 2022-11-19: 500 mL via INTRAVENOUS

## 2022-11-19 MED ORDER — ENOXAPARIN SODIUM 40 MG/0.4ML IJ SOSY
40.0000 mg | PREFILLED_SYRINGE | INTRAMUSCULAR | Status: DC
  Administered 2022-11-19: 40 mg via SUBCUTANEOUS
  Filled 2022-11-19: qty 0.4

## 2022-11-19 MED ORDER — LACTATED RINGERS IV BOLUS
1000.0000 mL | Freq: Once | INTRAVENOUS | Status: AC
  Administered 2022-11-19: 1000 mL via INTRAVENOUS

## 2022-11-19 MED ORDER — ONDANSETRON 4 MG PO TBDP: 4.0000 mg | ORAL_TABLET | Freq: Three times a day (TID) | ORAL | Status: DC | PRN

## 2022-11-19 MED ORDER — ENSURE ENLIVE PO LIQD
237.0000 mL | Freq: Two times a day (BID) | ORAL | Status: DC
  Administered 2022-11-20 – 2022-11-26 (×7): 237 mL via ORAL

## 2022-11-19 MED ORDER — INSULIN ASPART 100 UNIT/ML IJ SOLN
0.0000 [IU] | Freq: Three times a day (TID) | INTRAMUSCULAR | Status: DC
  Administered 2022-11-20 – 2022-11-22 (×2): 1 [IU] via SUBCUTANEOUS

## 2022-11-19 MED ORDER — MAGNESIUM SULFATE 2 GM/50ML IV SOLN
2.0000 g | Freq: Once | INTRAVENOUS | Status: AC
  Administered 2022-11-19: 2 g via INTRAVENOUS
  Filled 2022-11-19: qty 50

## 2022-11-19 MED ORDER — ONDANSETRON HCL 4 MG/2ML IJ SOLN
4.0000 mg | Freq: Once | INTRAMUSCULAR | Status: AC
  Administered 2022-11-19: 4 mg via INTRAVENOUS
  Filled 2022-11-19: qty 2

## 2022-11-19 MED ORDER — ACETAMINOPHEN 325 MG PO TABS
650.0000 mg | ORAL_TABLET | Freq: Four times a day (QID) | ORAL | Status: DC | PRN
  Administered 2022-11-19 – 2022-11-28 (×12): 650 mg via ORAL
  Filled 2022-11-19 (×12): qty 2

## 2022-11-19 NOTE — ED Notes (Signed)
ED TO INPATIENT HANDOFF REPORT  ED Nurse Name and Phone #: [phone]   S Name/Age/Gender Isaac Valdez 62 y.o. male Room/Bed: 044C/044C  Code Status   Code Status: Full Code  Home/SNF/Other Home Patient oriented to: self, place, time, and situation Is this baseline? Yes      Chief Complaint AKI (acute kidney injury) (HCC) [N17.9]  Triage Note Ongoing abdominal pain for a few weeks that worsened last week. Seen at [facility] and was dx with GERD. Also reports nausea, vomiting, diarrhea x 2 days.    Allergies Allergies  Allergen Reactions   Asa [Aspirin] Other (See Comments)    Indigestion Stomach discomfort    Level of Care/Admitting Diagnosis ED Disposition     ED Disposition  Admit   Condition  --   Comment  Hospital Area: [facility] [[id number]]  Level of Care: Med-Surg [16]  May admit patient to [facility] or [facility] if equivalent level of care is available:: No  Covid Evaluation: Asymptomatic - no recent exposure (last 10 days) testing not required  Diagnosis: AKI (acute kidney injury) [place]) [[id number]]  Admitting Physician: Lincoln Brigham [[id number]]  Attending Physician: Caro Laroche [[id number]]  Certification:: I certify this patient will need inpatient services for at least 2 midnights  Expected Medical Readiness: 11/22/2022          B Medical/Surgery History Past Medical History:  Diagnosis Date   Arthritis    Back pain    CHF (congestive heart failure) (HCC)    Diabetes mellitus    Dyspnea    GERD (gastroesophageal reflux disease)    Gout    High cholesterol    Hypertension    Sleep apnea    uses BIPAP nightly   Past Surgical History:  Procedure Laterality Date   A-FLUTTER ABLATION N/A 04/21/2017   Procedure: A-FLUTTER ABLATION;  Surgeon: Regan Lemming, MD;  Location: [facility];  Service: Cardiovascular;  Laterality: N/A;   BACK SURGERY     MANDIBLE FRACTURE SURGERY     SHOULDER  ARTHROSCOPY WITH BICEPSTENOTOMY Right 08/15/2020   Procedure: SHOULDER ARTHROSCOPY WITH BICEPSTENOTOMY;  Surgeon: Bjorn Pippin, MD;  Location: [facility];  Service: Orthopedics;  Laterality: Right;   SHOULDER ARTHROSCOPY WITH ROTATOR CUFF REPAIR Right 08/15/2020   Procedure: SHOULDER ARTHROSCOPY WITH ARTHROSCOPIC ROTATOR CUFF REPAIR;  Surgeon: Bjorn Pippin, MD;  Location: [facility];  Service: Orthopedics;  Laterality: Right;     A IV Location/Drains/Wounds Patient Lines/Drains/Airways Status     Active Line/Drains/Airways     Name Placement date Placement time Site Days   Peripheral IV 11/19/22 20 G Anterior;Proximal;Right Forearm 11/19/22  1504  Forearm  less than 1            Intake/Output Last 24 hours  Intake/Output Summary (Last 24 hours) at 11/19/2022 1948 Last data filed at 11/19/2022 1932 Gross per 24 hour  Intake 1233.19 ml  Output --  Net 1233.19 ml    Labs/Imaging Results for orders placed or performed during the hospital encounter of 11/19/22 (from the past 48 hour(s))  Urinalysis, Routine w reflex microscopic -Urine, Clean Catch     Status: Abnormal   Collection Time: 11/19/22 11:07 AM  Result Value Ref Range   Color, Urine YELLOW YELLOW   APPearance HAZY (A) CLEAR   Specific Gravity, Urine 1.013 1.005 - 1.030   pH 5.0 5.0 - 8.0   Glucose, UA NEGATIVE NEGATIVE mg/dL   Hgb urine  dipstick SMALL (A) NEGATIVE   Bilirubin Urine NEGATIVE NEGATIVE   Ketones, ur NEGATIVE NEGATIVE mg/dL   Protein, ur 865 (A) NEGATIVE mg/dL   Nitrite NEGATIVE NEGATIVE   Leukocytes,Ua NEGATIVE NEGATIVE   RBC / HPF 0-5 0 - 5 RBC/hpf   WBC, UA 0-5 0 - 5 WBC/hpf   Bacteria, UA RARE (A) NONE SEEN   Squamous Epithelial / HPF 0-5 0 - 5 /HPF   Mucus PRESENT     Comment: Performed at [facility], [address], [place], [place] [address]  Comprehensive metabolic panel     Status: Abnormal   Collection Time: 11/19/22 11:11 AM  Result Value Ref Range    Sodium 139 135 - 145 mmol/L   Potassium 3.2 (L) 3.5 - 5.1 mmol/L   Chloride 108 98 - 111 mmol/L   CO2 19 (L) 22 - 32 mmol/L   Glucose, Bld 139 (H) 70 - 99 mg/dL    Comment: Glucose reference range applies only to samples taken after fasting for at least 8 hours.   BUN 29 (H) 6 - 20 mg/dL   Creatinine, Ser 6.29 (H) 0.61 - 1.24 mg/dL   Calcium 8.9 8.9 - 52.8 mg/dL   Total Protein 7.0 6.5 - 8.1 g/dL   Albumin 3.7 3.5 - 5.0 g/dL   AST 24 15 - 41 U/L   ALT 22 0 - 44 U/L   Alkaline Phosphatase 61 38 - 126 U/L   Total Bilirubin 1.0 0.3 - 1.2 mg/dL   GFR, Estimated 33 (L) >60 mL/min    Comment: (NOTE) Calculated using the CKD-EPI Creatinine Equation (2021)    Anion gap 12 5 - 15    Comment: Performed at [facility], [address] [place], [place] [address]  Lipase, blood     Status: None   Collection Time: 11/19/22 11:11 AM  Result Value Ref Range   Lipase 30 11 - 51 U/L    Comment: Performed at [facility], [address] [place], [place] [address]  CBC with Diff     Status: Abnormal   Collection Time: 11/19/22 11:11 AM  Result Value Ref Range   WBC 4.4 4.0 - 10.5 K/uL   RBC 3.84 (L) 4.22 - 5.81 MIL/uL   Hemoglobin 11.6 (L) 13.0 - 17.0 g/dL   HCT 72.5 (L) 36.6 - 44.0 %   MCV 94.8 80.0 - 100.0 fL   MCH 30.2 26.0 - 34.0 pg   MCHC 31.9 30.0 - 36.0 g/dL   RDW 34.7 (H) 42.5 - 95.6 %   Platelets 259 150 - 400 K/uL   nRBC 0.0 0.0 - 0.2 %   Neutrophils Relative % 59 %   Neutro Abs 2.7 1.7 - 7.7 K/uL   Lymphocytes Relative 27 %   Lymphs Abs 1.2 0.7 - 4.0 K/uL   Monocytes Relative 11 %   Monocytes Absolute 0.5 0.1 - 1.0 K/uL   Eosinophils Relative 1 %   Eosinophils Absolute 0.0 0.0 - 0.5 K/uL   Basophils Relative 1 %   Basophils Absolute 0.1 0.0 - 0.1 K/uL   Immature Granulocytes 1 %   Abs Immature Granulocytes 0.02 0.00 - 0.07 K/uL    Comment: Performed at [facility], [address], [place], [place] [address]  Magnesium     Status: Abnormal    Collection Time: 11/19/22 12:00 PM  Result Value Ref Range   Magnesium 1.6 (L) 1.7 - 2.4 mg/dL    Comment:  Performed at [facility], [address] [place], [place] [address]   CT ABDOMEN PELVIS WO CONTRAST  Result Date: 11/19/2022 CLINICAL DATA:  Acute abdominal pain for several weeks, history of GERD EXAM: CT ABDOMEN AND PELVIS WITHOUT CONTRAST TECHNIQUE: Multidetector CT imaging of the abdomen and pelvis was performed following the standard protocol without IV contrast. RADIATION DOSE REDUCTION: This exam was performed according to the departmental dose-optimization program which includes automated exposure control, adjustment of the mA and/or kV according to patient size and/or use of iterative reconstruction technique. COMPARISON:  09/27/2016 FINDINGS: Lower chest: No acute pleural or parenchymal lung disease. Stable cardiomegaly. Hepatobiliary: Unremarkable unenhanced appearance of the liver. The gallbladder is decompressed with nonspecific gallbladder wall thickening. No pericholecystic fat stranding or calcified gallstones. No biliary duct dilation. Pancreas: Unremarkable unenhanced appearance. Spleen: Unremarkable unenhanced appearance. Adrenals/Urinary Tract: No urinary tract calculi or obstructive uropathy within either kidney. The adrenals and bladder are unremarkable. Stomach/Bowel: No bowel obstruction or ileus. Normal appendix right lower quadrant. No bowel wall thickening or inflammatory change. Small hiatal hernia. Vascular/Lymphatic: Aortic atherosclerosis. No enlarged abdominal or pelvic lymph nodes. Reproductive: Prostate is unremarkable. Other: Trace nonspecific free fluid within the bilateral flanks and lower pelvis. No free intraperitoneal gas. No abdominal wall hernia. Musculoskeletal: No acute or destructive bony abnormalities. Postsurgical changes at L4-5 again noted. Reconstructed images demonstrate no additional findings. IMPRESSION: 1. Small hiatal hernia. 2. Mild  nonspecific gallbladder wall thickening, likely due to decompressed state. If gallbladder pathology is suspected, right upper quadrant ultrasound could be considered. 3. Minimal nonspecific free fluid within the lower abdomen and pelvis, unchanged since prior exam. 4. Stable cardiomegaly. 5.  Aortic Atherosclerosis (ICD10-I70.0). Electronically Signed   By: Sharlet Salina M.D.   On: 11/19/2022 17:01   DG Chest Portable 1 View  Result Date: 11/19/2022 CLINICAL DATA:  Evaluate fluid status EXAM: PORTABLE CHEST 1 VIEW COMPARISON:  Chest radiograph 11/13/2022 FINDINGS: The heart is enlarged, unchanged. The upper mediastinal contours are normal There is no definite overt pulmonary edema. There is no focal airspace consolidation. There is no pleural effusion or pneumothorax There is no acute osseous abnormality. IMPRESSION: Cardiomegaly without evidence of pulmonary edema or pleural effusion. Electronically Signed   By: Lesia Hausen M.D.   On: 11/19/2022 16:02    Pending Labs Unresulted Labs (From admission, onward)     Start     Ordered   11/20/22 0500  Comprehensive metabolic panel  Tomorrow morning,   R        11/19/22 1834   11/20/22 0500  CBC  Tomorrow morning,   R        11/19/22 1834   11/20/22 0500  TSH  Tomorrow morning,   R        11/19/22 1854   11/20/22 0500  HIV Antibody (routine testing w rflx)  (HIV Antibody (Routine testing w reflex) panel)  Tomorrow morning,   R        11/19/22 1854   11/19/22 1906  Gastrointestinal Panel by PCR , Stool  (Gastrointestinal Panel by PCR, Stool                                                                                                                                                     **  Does Not include CLOSTRIDIUM DIFFICILE testing. **If CDIFF testing is needed, place order from the "C Difficile Testing" order set.**)  Once,   R        11/19/22 1905            Vitals/Pain Today's Vitals   11/19/22 1531 11/19/22 1745 11/19/22 1800 11/19/22  1826  BP:  (!) 172/98 (!) 163/98 (!) 163/98  Pulse:  68 68 69  Resp:  20 15 18   Temp:    97.9 F (36.6 C)  TempSrc:    Oral  SpO2:  97% 100% 100%  Weight:      Height:      PainSc: 5        Isolation Precautions Enteric precautions (UV disinfection)  Medications Medications  enoxaparin (LOVENOX) injection 40 mg (has no administration in time range)  carvedilol (COREG) tablet 25 mg (has no administration in time range)  acetaminophen (TYLENOL) tablet 650 mg (has no administration in time range)    Or  acetaminophen (TYLENOL) suppository 650 mg (has no administration in time range)  ondansetron (ZOFRAN-ODT) disintegrating tablet 4 mg (has no administration in time range)  feeding supplement (ENSURE ENLIVE / ENSURE PLUS) liquid 237 mL (has no administration in time range)  influenza vac split trivalent PF (FLULAVAL) injection 0.5 mL (has no administration in time range)  pantoprazole (PROTONIX) EC tablet 40 mg (has no administration in time range)  ondansetron (ZOFRAN) injection 4 mg (4 mg Intravenous Given 11/19/22 1506)  morphine (PF) 4 MG/ML injection 4 mg (4 mg Intravenous Given 11/19/22 1506)  potassium chloride 10 mEq in 100 mL IVPB (0 mEq Intravenous Stopped 11/19/22 1932)  lactated ringers bolus 1,000 mL (0 mLs Intravenous Stopped 11/19/22 1932)  magnesium sulfate IVPB 2 g 50 mL (0 g Intravenous Stopped 11/19/22 1619)  lactated ringers bolus 500 mL (500 mLs Intravenous New Bag/Given 11/19/22 1934)    Mobility walks     Focused Assessments     R Recommendations: See Admitting Provider Note  Report given to:   Additional Notes:

## 2022-11-19 NOTE — Assessment & Plan Note (Addendum)
K 3.2 likely in the setting of nausea and vomiting and minimal PO intake. - s/p IV K in ED - Monitor with BMP - Replete as necessary

## 2022-11-19 NOTE — Assessment & Plan Note (Addendum)
Cr 2.25 (baseline ~1.2). Most likely prerenal 2/2 N/V/D as above. May also be related to starting Entresto. - s/p 1L LR in ED - Pt still appears volume down on exam, will give 500cc LR bolus and allow to PO - Avoid NSAIDs and nephrotoxic drugs until AKI improves

## 2022-11-19 NOTE — Progress Notes (Signed)
Patient arrived to the room and ambulated to the bed. Patient is alert and oriented x4. VS stable. Patient reported ABD pain 9/10; [name], MD was notified. Patient was oriented to the room, staffs and call bell. Bed alarm in place.  Isaac Valdez

## 2022-11-19 NOTE — Hospital Course (Addendum)
Isaac Valdez is a 62 y.o.male with a history of CHF, A Flutter, T2DM, GERD, HTN, HLD who was admitted to the [facility] at [facility] for abdominal pain and AKI. His hospital course is detailed below:  AKI Likely in the setting of his nausea, vomiting yesterday along with minimal PO intake. Has been compliant with his medications. Creatinine 2.25 on admission, decreased to 2.06.  Caution with IVF due to history of CHF. Renal US unremarkable, micro ACR elevated at 106. Creatinine improved throughout inpatient stay.   Abdominal Pain Nonspecific abdominal pain since June, but worsening over the last 2 to 3 weeks with associated nausea, vomiting, 40 to 50 pound weight loss in the last year, loss of appetite.RUQ [place] circumferential gallbladder wall thickening and edema, CT A/P with small hiatal hernia, mild nonspecific gallbladder wall thickening as above. Abdominal pain resolved on 9/16. GI consulted, endoscopy and colonoscopy done ***.   Heart Failure Echo EF 25-30%, severely elevated pulmonary artery systolic pressure, BNP elevated into 3000's. R heart cath consistent with severe pulmonary hypertension. PICC line was placed to monitor cardiac pressures. Cardiac MRI consistent with echo. Pt was diuresed with IV Lasix throughout stay which was then transitioned to PO Torsemide.   Shortness of Breath Intermittent shortness of breath with exertion x 1 week. V/Q scan negative. Trops trended flat in 20's. EKG non concerning for MI.   Other chronic conditions were medically managed with home medications and formulary alternatives as necessary (CHF, HTN, aflutter, HLD)  PCP Follow-up Recommendations: Anemia follow up

## 2022-11-19 NOTE — ED Provider Notes (Signed)
[place] EMERGENCY DEPARTMENT AT [facility] Provider Note   CSN: [id number] Arrival date & time: 11/19/22  1043     History  Chief Complaint  Patient presents with   Abdominal Pain    Isaac Valdez is a 62 y.o. male.  62 year old male presents today for evaluation of abdominal pain which has been ongoing for 3 weeks associated with nausea and vomiting and decreased p.o. intake.  He states yesterday he also developed diarrhea.  Was evaluated at [facility] hospital last week.  He was given a referral to GI.  He states he also did a bowel prep for potential colonoscopy but when he went to the clinic they stated they could not do due to his history of CHF.  Pain is generalized however worse in the epigastric region.  Does endorse exertional dyspnea as well.  Reports compliance with torsemide.  Denies any chest pain, lightheadedness, or palpitations.  The history is provided by the patient. No language interpreter was used.       Home Medications Prior to Admission medications   Medication Sig Start Date End Date Taking? Authorizing Provider  albuterol (VENTOLIN HFA) 108 (90 Base) MCG/ACT inhaler Inhale 1-2 puffs into the lungs every 6 (six) hours as needed for wheezing or shortness of breath. 03/11/22   Long, Arlyss Repress, MD  amLODipine (NORVASC) 5 MG tablet Take 5 mg by mouth daily.    [provider]  atorvastatin (LIPITOR) 80 MG tablet Take 80 mg by mouth at bedtime. 12/10/18   [provider]  azithromycin (ZITHROMAX) 250 MG tablet Take 1 tablet (250 mg total) by mouth daily. Take first 2 tablets together, then 1 every day until finished. 04/23/22   Long, Arlyss Repress, MD  benzonatate (TESSALON) 100 MG capsule Take 1 capsule (100 mg total) by mouth 3 (three) times daily as needed for cough. 04/23/22   Long, Arlyss Repress, MD  carvedilol (COREG) 25 MG tablet Take 1 tablet by mouth twice daily 05/19/21   Camnitz, Andree Coss, MD  cetirizine (ZYRTEC ALLERGY) 10  MG tablet Take 1 tablet (10 mg total) by mouth daily. 03/11/22   Long, Arlyss Repress, MD  clonazePAM (KLONOPIN) 0.5 MG tablet Take 1 tablet (0.5 mg total) by mouth as needed. 08/15/20   McBane, Jerald Kief, PA-C  cloNIDine (CATAPRES) 0.1 MG tablet as needed. 12/20/19   [provider]  colchicine 0.6 MG tablet Take as directed for gout flare. 07/16/21   Molpus, John, MD  famotidine (PEPCID) 40 MG tablet Take 40 mg by mouth daily. 11/09/19   [provider]  fluticasone (FLONASE) 50 MCG/ACT nasal spray Place 2 sprays into both nostrils daily. 04/23/22   Long, Arlyss Repress, MD  glipiZIDE (GLUCOTROL) 5 MG tablet Take 5 mg by mouth daily as needed (CBG >120).  08/12/16   [provider]  guaiFENesin (ROBITUSSIN) 100 MG/5ML liquid Take 5-10 mLs (100-200 mg total) by mouth every 4 (four) hours as needed for cough or to loosen phlegm. 03/11/22   Long, Arlyss Repress, MD  meloxicam (MOBIC) 15 MG tablet Take 1 tablet daily as needed for joint pain 07/16/21   Molpus, John, MD  predniSONE (STERAPRED UNI-PAK 21 TAB) 10 MG (21) TBPK tablet Take by mouth daily. Take 6 tabs by mouth daily  for 2 days, then 5 tabs for 2 days, then 4 tabs for 2 days, then 3 tabs for 2 days, 2 tabs for 2 days, then 1 tab by mouth daily for 2 days  01/18/22   Prosperi, Christian H, PA-C  probenecid (BENEMID) 500 MG tablet Take 1 tablet twice daily for 7 days as needed for gout flare. 01/18/22   Prosperi, Christian H, PA-C  terbinafine (LAMISIL) 1 % cream as needed. 03/27/20   [provider]  torsemide (DEMADEX) 20 MG tablet Take 20 mg by mouth at bedtime.    [provider]      Allergies    Aspirin    Review of Systems   Review of Systems  Constitutional:  Negative for chills and fever.  Respiratory:  Positive for shortness of breath.   Cardiovascular:  Negative for chest pain, palpitations and leg swelling.  Gastrointestinal:  Positive for abdominal pain, diarrhea, nausea and vomiting.  Neurological:  Negative  for light-headedness.  All other systems reviewed and are negative.   Physical Exam Updated Vital Signs BP (!) 156/85   Pulse 73   Temp 97.9 F (36.6 C) (Oral)   Resp 17   Ht 5\' 11"  (1.803 m)   Wt 113.4 kg   SpO2 100%   BMI 34.87 kg/m  Physical Exam Vitals and nursing note reviewed.  Constitutional:      General: He is not in acute distress.    Appearance: Normal appearance. He is not ill-appearing.  HENT:     Head: Normocephalic and atraumatic.     Nose: Nose normal.  Eyes:     General: No scleral icterus.    Extraocular Movements: Extraocular movements intact.     Conjunctiva/sclera: Conjunctivae normal.  Cardiovascular:     Rate and Rhythm: Normal rate and regular rhythm.     Pulses: Normal pulses.     Heart sounds: Normal heart sounds.  Pulmonary:     Effort: Pulmonary effort is normal. No respiratory distress.     Breath sounds: Normal breath sounds. No wheezing or rales.  Abdominal:     General: There is no distension.     Palpations: Abdomen is soft.     Tenderness: There is no abdominal tenderness. There is no guarding.  Musculoskeletal:        General: Normal range of motion.     Cervical back: Normal range of motion.  Skin:    General: Skin is warm and dry.  Neurological:     General: No focal deficit present.     Mental Status: He is alert. Mental status is at baseline.     ED Results / Procedures / Treatments   Labs (all labs ordered are listed, but only abnormal results are displayed) Labs Reviewed  COMPREHENSIVE METABOLIC PANEL - Abnormal; Notable for the following components:      Result Value   Potassium 3.2 (*)    CO2 19 (*)    Glucose, Bld 139 (*)    BUN 29 (*)    Creatinine, Ser 2.25 (*)    GFR, Estimated 33 (*)    All other components within normal limits  CBC WITH DIFFERENTIAL/PLATELET - Abnormal; Notable for the following components:   RBC 3.84 (*)    Hemoglobin 11.6 (*)    HCT 36.4 (*)    RDW 17.1 (*)    All other components  within normal limits  URINALYSIS, ROUTINE W REFLEX MICROSCOPIC - Abnormal; Notable for the following components:   APPearance HAZY (*)    Hgb urine dipstick SMALL (*)    Protein, ur 100 (*)    Bacteria, UA RARE (*)    All other components within normal limits  LIPASE, BLOOD  EKG EKG Interpretation Date/Time:  Thursday November 19 2022 10:51:08 EDT Ventricular Rate:  75 PR Interval:  164 QRS Duration:  108 QT Interval:  434 QTC Calculation: 484 R Axis:   115  Text Interpretation: Sinus rhythm with occasional Premature ventricular complexes Left posterior fascicular block T wave abnormality, consider inferior ischemia Abnormal ECG When compared with ECG of 08-Oct-2017 12:17, PREVIOUS ECG IS PRESENT PVCs are new Confirmed by Jacalyn Lefevre [phone]) on 11/19/2022 2:29:36 PM  Radiology No results found.  Procedures Procedures    Medications Ordered in ED Medications  ondansetron (ZOFRAN) injection 4 mg (has no administration in time range)  morphine (PF) 4 MG/ML injection 4 mg (has no administration in time range)  potassium chloride 10 mEq in 100 mL IVPB (has no administration in time range)  lactated ringers bolus 1,000 mL (has no administration in time range)  magnesium sulfate IVPB 2 g 50 mL (has no administration in time range)    ED Course/ Medical Decision Making/ A&P Clinical Course as of 11/19/22 1533  Thu Nov 19, 2022  1527 Abdominal pain x 3 weeks. Did bowel prep for colonoscopy Friday,  now has AKI. - Admit [KH]    Clinical Course User Index [KH] Claretha Cooper, DO                                 Medical Decision Making Amount and/or Complexity of Data Reviewed Labs: ordered. Radiology: ordered.  Risk Prescription drug management.   Medical Decision Making / ED Course   This patient presents to the ED for concern of abdominal pain, exertional dyspnea, this involves an extensive number of treatment options, and is a complaint that carries with  it a high risk of complications and morbidity.  The differential diagnosis includes CHF exacerbation, pneumonia, PE, pancreatitis, gastroenteritis, colitis, appendicitis  MDM: 62 year old male presents today for concern of abdominal pain ongoing for about 3 weeks now.  Recently seen at an outside emergency department and was recommended GI referral.  He states he did a bowel prep prior to his follow-up appointment and despite undergoing bowel prep he was unable to proceed with colonoscopy.  Does also endorse exertional dyspnea but this is chronic.  Has history of CHF.  Reports compliance with his torsemide.  Labs obtained through triage show mild hypokalemia 3.2, AKI with creatinine 2.25.  His baseline is around 1.00.  CBC without leukocytosis.  Mild anemia at 11.6 but this is around his baseline.  UA without evidence of UTI.  Lipase within normal limits.  Will add on magnesium level.  In the meantime I will order CT abdomen pelvis with contrast to further evaluate for the acute cause of his abdominal pain, chest x-ray to ensure he is not in CHF exacerbation for the exertional dyspnea.  Will provide him with 1 L fluid bolus (will only give 1 L for now at 500 mL/hour right given his history of CHF), symptomatic management with morphine and Zofran, potassium repletion and magnesium repletion.  Outside hospital records reviewed.  Most recent creatinine was 1.24, magnesium level was 1.4 at the time.    Patient at the end of my shift is awaiting CT imaging.  Signed out to oncoming provider.  Additional history obtained: -Additional history obtained from care everywhere.  Recent ED visit at OSH -External records from outside source obtained and reviewed including: Chart review including previous notes, labs, imaging, consultation notes   Lab Tests: -  I ordered, reviewed, and interpreted labs.   The pertinent results include:   Labs Reviewed  COMPREHENSIVE METABOLIC PANEL - Abnormal; Notable for the  following components:      Result Value   Potassium 3.2 (*)    CO2 19 (*)    Glucose, Bld 139 (*)    BUN 29 (*)    Creatinine, Ser 2.25 (*)    GFR, Estimated 33 (*)    All other components within normal limits  CBC WITH DIFFERENTIAL/PLATELET - Abnormal; Notable for the following components:   RBC 3.84 (*)    Hemoglobin 11.6 (*)    HCT 36.4 (*)    RDW 17.1 (*)    All other components within normal limits  URINALYSIS, ROUTINE W REFLEX MICROSCOPIC - Abnormal; Notable for the following components:   APPearance HAZY (*)    Hgb urine dipstick SMALL (*)    Protein, ur 100 (*)    Bacteria, UA RARE (*)    All other components within normal limits  LIPASE, BLOOD  MAGNESIUM      EKG  EKG Interpretation Date/Time:  Thursday November 19 2022 10:51:08 EDT Ventricular Rate:  75 PR Interval:  164 QRS Duration:  108 QT Interval:  434 QTC Calculation: 484 R Axis:   115  Text Interpretation: Sinus rhythm with occasional Premature ventricular complexes Left posterior fascicular block T wave abnormality, consider inferior ischemia Abnormal ECG When compared with ECG of 08-Oct-2017 12:17, PREVIOUS ECG IS PRESENT PVCs are new Confirmed by Jacalyn Lefevre [phone]) on 11/19/2022 2:29:36 PM         Imaging Studies ordered: I ordered imaging studies including chest x-ray, CT abdomen pelvis wo contrast I independently visualized and interpreted imaging. I agree with the radiologist interpretation   Medicines ordered and prescription drug management: Meds ordered this encounter  Medications   ondansetron (ZOFRAN) injection 4 mg   morphine (PF) 4 MG/ML injection 4 mg   potassium chloride 10 mEq in 100 mL IVPB   lactated ringers bolus 1,000 mL   magnesium sulfate IVPB 2 g 50 mL    -I have reviewed the patients home medicines and have made adjustments as needed  Co morbidities that complicate the patient evaluation  Past Medical History:  Diagnosis Date   Arthritis    Back pain     CHF (congestive heart failure) (HCC)    Diabetes mellitus    Dyspnea    GERD (gastroesophageal reflux disease)    Gout    High cholesterol    Hypertension    Sleep apnea    uses BIPAP nightly      Dispostion: Patient signed out to oncoming team to follow-up on CT imaging.  He will require admission for AKI.  Final Clinical Impression(s) / ED Diagnoses Final diagnoses:  Abdominal pain, unspecified abdominal location  Hypokalemia  AKI (acute kidney injury) [facility])    Rx / DC Orders ED Discharge Orders     None         Marita Kansas, PA-C 11/19/22 1533    Jacalyn Lefevre, MD 11/20/22 [phone]

## 2022-11-19 NOTE — ED Triage Notes (Signed)
Ongoing abdominal pain for a few weeks that worsened last week. Seen at [facility] and was dx with GERD. Also reports nausea, vomiting, diarrhea x 2 days.

## 2022-11-19 NOTE — H&P (Signed)
Hospital Admission History and Physical Service Pager: [phone]  Patient name: Isaac Valdez Medical record number: [id number] Date of Birth: November 06, 1960 Age: 62 y.o. Gender: male  Primary Care Provider: Si Gaul, DO Consultants: none Code Status: Full Code Preferred Emergency Contact:  Contact Information     Name Relation Home Work Mobile   Scritchfield,Althea Spouse [phone]  [phone]      Other Contacts   None on File      Chief Complaint: Abdominal pain, N/V  Assessment and Plan: Isaac Valdez is a 62 y.o. male presenting with abdominal pain, nausea, and vomiting. Differential for presentation of this includes malignancy, biliary obstruction, PUD, GERD, hypothyroidism, pancreatitis. Malignancy is considered given associated 1 yr hx of decreased appetite and unintentional weight loss. Biliary obstruction is considered given epigastric location of pain and gallbladder thickening found on CTAP, although transaminases and alk phos wnl. Will obtain RUQ [place] for further workup. PUD, GERD are considered, although pt denies blood in vomit or stool. Hypothyroidism is considered given weight loss. Pancreatitis is less likely given normal lipase and mild degree of pain. Low c/f peritonitis given lack of rebound, guarding, or rigidity.   Assessment & Plan Abdominal pain, unspecified abdominal location - Admit to FMTS, attending Dr. Linwood Dibbles - Med-Surg, Vital signs per floor - Regular diet  - PT/OT to treat - AM CBC/BMP  - RUQ [place] - f/u GIPP, TSH - Tylenol prn for pain - Zofran prn for N/V - Protonix 40mg  daily Hypokalemia K 3.2 likely in the setting of nausea and vomiting and minimal PO intake. - s/p IV K in ED - Monitor with BMP - Replete as necessary AKI (acute kidney injury) (HCC) Cr 2.25 (baseline ~1.2). Most likely prerenal 2/2 N/V/D as above. May also be related to starting Entresto. - s/p 1L LR in ED - Pt still appears volume down on exam,  will give 500cc LR bolus and allow to PO - Avoid NSAIDs and nephrotoxic drugs until AKI improves Chronic diastolic congestive heart failure (HCC) CHF (stage 3 or 4 DD, EF 55-60% on 07/2022 echo) - Cont home Coreg - Holding Entresto and torsemide given AKI Hypomagnesemia Mg 1.6 - s/p 2g IV Mg in ED - AM Mg, replete as needed Type 2 diabetes mellitus with other specified complication, unspecified whether long term insulin use (HCC) - SSI - Hold home glipizide  Chronic and Stable Problems:  CHF: On Entresto. Cardiomegaly on XR w/o concern for pulmonary edema or pleural effusion.  HTN  Aflutter: On Coreg 25 mg HLD: On Lipitor  *Restart home meds after med rec complete  FEN/GI: Regular diet VTE Prophylaxis: Lovenox  Disposition: Med-Surg  History of Present Illness:  Isaac Valdez is a 62 y.o. male presenting with diffuse abdominal pain, nausea, vomiting for 3 weeks and diarrhea started yesterday. He has tried Nexium which doesn't seem to help. He has been on Entresto since June and he is wondering if his symptoms are associated with this. He describes his abdominal pain as sharp, occasional, worsens with activities. He has decreased appetite, but is able to keep some food down. Loss of appetite over the last year and has lost about 40 pounds unintentionally. Denies any blood in his vomit or bowel movements.   He has shortness of breath with activities as well which started along with his abdominal complaints. Denies fever, chills, chest pain, or dysuria. Denies any abdominal surgeries.   Patient recently evaluated at [facility] last week and was  given a GI referral and completed bowel prep for colonoscopy, but couldn't be done due to lack of cardiac clearance for procedure due to history of CHF.   ED Course: Vitals notable for elevate BP on admission. Labs notable for Cr 2.25, K 3.2, Mg 1.6, Hgb 11.6. CTAP showed small hiatal hernia, mild gallbladder wall thickening.  CXR showed no acute findings. Pt received zofran, 1L LR bolus, 2g mag sulfate, IV Kcl, morphine, and zofran. Given AKI, FMTS was consulted for admission.   Review Of Systems: Per HPI with the following additions: as above.  Pertinent Past Medical History: A Flutter HLD HTN CHF (stage 3 or 4 DD, EF 55-60% on 07/2022 echo) T2DM GERD  Remainder reviewed in history tab.   Pertinent Past Surgical History: A flutter ablation Back surgery Shoulder arthroscopy   Remainder reviewed in history tab.   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   Important Outpatient Medications: Lipitor 80 mg daily at bedtime Coreg 25 mg daily Pepcid 40 mg daily Entresto Toresemide 20 mg daily at bedtime  Remainder reviewed in medication history.   Objective: BP (!) 163/98   Pulse 69   Temp 97.9 F (36.6 C) (Oral)   Resp 18   Ht 5\' 11"  (1.803 m)   Wt 113.4 kg   SpO2 100%   BMI 34.87 kg/m  Exam: General: Alert, friendly older man laying in bed. Speaking in full sentences. NAD. Has rhythmic movement of R arm and mouth (this is baseline per pt) HEENT: NCAT. MMM. Cardiovascular: RRR, no murmurs.  Respiratory: CTAB. Normal WOB on RA.  Gastrointestinal: Soft, nontender, nondistended. Normal BS. No signs of rebound, rigidity, guarding.  MSK: No pitting edema  Labs:  CBC BMET  Recent Labs  Lab 11/19/22 1111  WBC 4.4  HGB 11.6*  HCT 36.4*  PLT 259   Recent Labs  Lab 11/19/22 1111  NA 139  K 3.2*  CL 108  CO2 19*  BUN 29*  CREATININE 2.25*  GLUCOSE 139*  CALCIUM 8.9     EKG:  Sinus rhythm w/ PVC, no ST changes.   Imaging Studies Performed:  CTAP 1. Small hiatal hernia. 2. Mild nonspecific gallbladder  wall thickening, likely due to decompressed state. If gallbladder pathology is suspected, right upper quadrant ultrasound could be considered. 3. Minimal nonspecific free fluid within the lower abdomen and pelvis, unchanged since prior exam. 4. Stable cardiomegaly. 5.  Aortic Atherosclerosis (ICD10-I70.0).  CXR - Cardiomegaly without evidence of pulmonary edema or pleural effusion.  Lincoln Brigham, MD 11/19/2022, 7:52 PM PGY-1, [facility]  FPTS Intern pager: [phone], text pages welcome Secure chat group [facility] Teaching Service

## 2022-11-20 ENCOUNTER — Inpatient Hospital Stay (HOSPITAL_COMMUNITY): Payer: Medicare Other

## 2022-11-20 DIAGNOSIS — I5032 Chronic diastolic (congestive) heart failure: Secondary | ICD-10-CM | POA: Insufficient documentation

## 2022-11-20 DIAGNOSIS — R0609 Other forms of dyspnea: Secondary | ICD-10-CM | POA: Diagnosis not present

## 2022-11-20 DIAGNOSIS — N189 Chronic kidney disease, unspecified: Secondary | ICD-10-CM | POA: Diagnosis not present

## 2022-11-20 DIAGNOSIS — R195 Other fecal abnormalities: Secondary | ICD-10-CM | POA: Diagnosis not present

## 2022-11-20 DIAGNOSIS — I5033 Acute on chronic diastolic (congestive) heart failure: Secondary | ICD-10-CM | POA: Diagnosis not present

## 2022-11-20 DIAGNOSIS — R109 Unspecified abdominal pain: Secondary | ICD-10-CM

## 2022-11-20 DIAGNOSIS — R634 Abnormal weight loss: Secondary | ICD-10-CM | POA: Diagnosis not present

## 2022-11-20 DIAGNOSIS — I5023 Acute on chronic systolic (congestive) heart failure: Secondary | ICD-10-CM | POA: Diagnosis present

## 2022-11-20 DIAGNOSIS — N179 Acute kidney failure, unspecified: Secondary | ICD-10-CM | POA: Diagnosis not present

## 2022-11-20 LAB — COMPREHENSIVE METABOLIC PANEL
ALT: 25 U/L (ref 0–44)
AST: 25 U/L (ref 15–41)
Albumin: 3.6 g/dL (ref 3.5–5.0)
Alkaline Phosphatase: 62 U/L (ref 38–126)
Anion gap: 8 (ref 5–15)
BUN: 32 mg/dL — ABNORMAL HIGH (ref 6–20)
CO2: 22 mmol/L (ref 22–32)
Calcium: 9 mg/dL (ref 8.9–10.3)
Chloride: 110 mmol/L (ref 98–111)
Creatinine, Ser: 2.06 mg/dL — ABNORMAL HIGH (ref 0.61–1.24)
GFR, Estimated: 36 mL/min — ABNORMAL LOW (ref >60)
Glucose, Bld: 112 mg/dL — ABNORMAL HIGH (ref 70–99)
Potassium: 4.3 mmol/L (ref 3.5–5.1)
Sodium: 140 mmol/L (ref 135–145)
Total Bilirubin: 0.9 mg/dL (ref 0.3–1.2)
Total Protein: 7 g/dL (ref 6.5–8.1)

## 2022-11-20 LAB — GLUCOSE, CAPILLARY
Glucose-Capillary: 114 mg/dL — ABNORMAL HIGH (ref 70–99)
Glucose-Capillary: 95 mg/dL (ref 70–99)
Glucose-Capillary: 136 mg/dL — ABNORMAL HIGH (ref 70–99)
Glucose-Capillary: 107 mg/dL — ABNORMAL HIGH (ref 70–99)

## 2022-11-20 LAB — CBC
HCT: 39.4 % (ref 39.0–52.0)
Hemoglobin: 12.4 g/dL — ABNORMAL LOW (ref 13.0–17.0)
MCH: 30 pg (ref 26.0–34.0)
MCHC: 31.5 g/dL (ref 30.0–36.0)
MCV: 95.2 fL (ref 80.0–100.0)
Platelets: 263 10*3/uL (ref 150–400)
RBC: 4.14 MIL/uL — ABNORMAL LOW (ref 4.22–5.81)
RDW: 17.1 % — ABNORMAL HIGH (ref 11.5–15.5)
WBC: 4 10*3/uL (ref 4.0–10.5)
nRBC: 0 % (ref 0.0–0.2)

## 2022-11-20 LAB — HEMOGLOBIN A1C
Hgb A1c MFr Bld: 5.6 % (ref 4.8–5.6)
Mean Plasma Glucose: 114 mg/dL

## 2022-11-20 LAB — HIV ANTIBODY (ROUTINE TESTING W REFLEX): HIV Screen 4th Generation wRfx: NONREACTIVE

## 2022-11-20 LAB — TSH: TSH: 1.021 u[IU]/mL (ref 0.350–4.500)

## 2022-11-20 LAB — TROPONIN I (HIGH SENSITIVITY)
Troponin I (High Sensitivity): 26 ng/L — ABNORMAL HIGH (ref <18)
Troponin I (High Sensitivity): 23 ng/L — ABNORMAL HIGH (ref <18)

## 2022-11-20 LAB — MAGNESIUM: Magnesium: 1.9 mg/dL (ref 1.7–2.4)

## 2022-11-20 MED ORDER — MELATONIN 3 MG PO TABS
3.0000 mg | ORAL_TABLET | Freq: Every evening | ORAL | Status: DC | PRN
  Administered 2022-11-21 – 2022-11-27 (×5): 3 mg via ORAL
  Filled 2022-11-20 (×5): qty 1

## 2022-11-20 MED ORDER — ENOXAPARIN SODIUM 60 MG/0.6ML IJ SOSY
60.0000 mg | PREFILLED_SYRINGE | INTRAMUSCULAR | Status: DC
  Administered 2022-11-20 – 2022-11-21 (×2): 60 mg via SUBCUTANEOUS
  Filled 2022-11-20 (×3): qty 0.6

## 2022-11-20 MED ORDER — DIPHENHYDRAMINE HCL 25 MG PO CAPS
25.0000 mg | ORAL_CAPSULE | Freq: Two times a day (BID) | ORAL | Status: DC
  Administered 2022-11-20 – 2022-11-28 (×17): 25 mg via ORAL
  Filled 2022-11-20 (×17): qty 1

## 2022-11-20 MED ORDER — TECHNETIUM TO 99M ALBUMIN AGGREGATED
4.4000 | Freq: Once | INTRAVENOUS | Status: AC | PRN
  Administered 2022-11-20: 4.4 via INTRAVENOUS

## 2022-11-20 NOTE — Plan of Care (Signed)
Patient alert/oriented X4. Patient compliant with medication administration and administered benadryl and klonopin as needed. Enteric precautions maintained, VSS, no complaints at this time.   Problem: Education: Goal: Ability to describe self-care measures that may prevent or decrease complications (Diabetes Survival Skills Education) will improve Outcome: Progressing   Problem: Education: Goal: Individualized Educational Video(s) Outcome: Progressing   Problem: Coping: Goal: Ability to adjust to condition or change in health will improve Outcome: Progressing   Problem: Fluid Volume: Goal: Ability to maintain a balanced intake and output will improve Outcome: Progressing   Problem: Health Behavior/Discharge Planning: Goal: Ability to identify and utilize available resources and services will improve Outcome: Progressing   Problem: Health Behavior/Discharge Planning: Goal: Ability to manage health-related needs will improve Outcome: Progressing   Problem: Metabolic: Goal: Ability to maintain appropriate glucose levels will improve Outcome: Progressing   Problem: Nutritional: Goal: Maintenance of adequate nutrition will improve Outcome: Progressing   Problem: Nutritional: Goal: Progress toward achieving an optimal weight will improve Outcome: Progressing   Problem: Skin Integrity: Goal: Risk for impaired skin integrity will decrease Outcome: Progressing   Problem: Tissue Perfusion: Goal: Adequacy of tissue perfusion will improve Outcome: Progressing   Problem: Education: Goal: Knowledge of General Education information will improve Description: Including pain rating scale, medication(s)/side effects and non-pharmacologic comfort measures Outcome: Progressing   Problem: Health Behavior/Discharge Planning: Goal: Ability to manage health-related needs will improve Outcome: Progressing   Problem: Clinical Measurements: Goal: Ability to maintain clinical measurements  within normal limits will improve Outcome: Progressing   Problem: Clinical Measurements: Goal: Will remain free from infection Outcome: Progressing   Problem: Clinical Measurements: Goal: Diagnostic test results will improve Outcome: Progressing   Problem: Clinical Measurements: Goal: Respiratory complications will improve Outcome: Progressing   Problem: Clinical Measurements: Goal: Cardiovascular complication will be avoided Outcome: Progressing   Problem: Activity: Goal: Risk for activity intolerance will decrease Outcome: Progressing   Problem: Nutrition: Goal: Adequate nutrition will be maintained Outcome: Progressing   Problem: Elimination: Goal: Will not experience complications related to bowel motility Outcome: Progressing   Problem: Elimination: Goal: Will not experience complications related to urinary retention Outcome: Progressing   Problem: Pain Managment: Goal: General experience of comfort will improve Outcome: Progressing   Problem: Safety: Goal: Ability to remain free from injury will improve Outcome: Progressing   Problem: Skin Integrity: Goal: Risk for impaired skin integrity will decrease Outcome: Progressing

## 2022-11-20 NOTE — Assessment & Plan Note (Signed)
CHF (stage 3 or 4 DD, EF 55-60% on 07/2022 echo) - Cont home Coreg - Holding Entresto and torsemide given AKI

## 2022-11-20 NOTE — Assessment & Plan Note (Addendum)
Nonspecific, generalized, has been going on for a couple months but worsened over the last 2 to 3 weeks.  Unable to tolerate much p.o. intake, and he has a loss of appetite. - Regular diet  - RUQ [place] circumferential gallbladder wall thickening and edema - CT A/P with small hiatal hernia, mild nonspecific gallbladder wall thickening as above - f/u GIPP - TSH 1.021 - New PVCs on EKG yesterday. New one ordered today. - Trop 26, repeat pending - V/Q scan - consult GI, appreciate recs - Tylenol prn for pain - Zofran prn for N/V - Protonix 40mg  daily

## 2022-11-20 NOTE — Care Management CC44 (Cosign Needed)
Condition Code 44 Documentation Completed  Patient Details  Name: Jameek Ditommaso MRN: [id number] Date of Birth: 12/01/1960   Condition Code 44 given:  Yes Patient signature on Condition Code 44 notice:  Yes Documentation of 2 MD's agreement:  Yes Code 44 added to claim:  Yes    Janae Bridgeman, RN 11/20/2022, 4:38 PM

## 2022-11-20 NOTE — Care Management Obs Status (Cosign Needed)
MEDICARE OBSERVATION STATUS NOTIFICATION   Patient Details  Name: Isaac Valdez MRN: [id number] Date of Birth: May 30, 1960   Medicare Observation Status Notification Given:  Yes    Janae Bridgeman, RN 11/20/2022, 4:38 PM

## 2022-11-20 NOTE — Plan of Care (Signed)

## 2022-11-20 NOTE — Progress Notes (Signed)
Transition of Care [facility]) - Inpatient Brief Assessment   Patient Details  Name: Isaac Valdez MRN: [id number] Date of Birth: 12-07-1960  Transition of Care [facility]) CM/SW Contact:    Janae Bridgeman, RN Phone Number: 11/20/2022, 4:51 PM   Clinical Narrative: CM met with the patient at the bedside - patient admitted for Acute Kidney injury and abdominal pain.  Patient provided with Medicare Observation notice and Code 44 at the bedside.  Patient lives with his wife and has RW and Cane at the home.  Patient plans to return home to self care when he is discharged.  OT/PT not recommending therapy needs at this time - Therapy present at the bedside at this time.   Transition of Care Asessment:   Patient has primary care physician: (P) Yes Home environment has been reviewed: (P) From home with wife Prior level of function:: (P) Independent Prior/Current Home Services: (P) No current home services Social Determinants of Health Reivew: (P) SDOH reviewed no interventions necessary Readmission risk has been reviewed: (P) Yes Transition of care needs: (P) no transition of care needs at this time

## 2022-11-20 NOTE — Assessment & Plan Note (Signed)
-   Admit to FMTS, attending Dr. Linwood Dibbles - Med-Surg, Vital signs per floor - Regular diet  - PT/OT to treat - AM CBC/BMP  - RUQ [place] - f/u GIPP, TSH - Tylenol prn for pain - Zofran prn for N/V - Protonix 40mg  daily

## 2022-11-20 NOTE — Assessment & Plan Note (Signed)
-   SSI - Hold home glipizide

## 2022-11-20 NOTE — Evaluation (Signed)
Physical Therapy Evaluation Patient Details Name: Isaac Valdez MRN: [id number] DOB: 16-Oct-1960 Today's Date: 11/20/2022  History of Present Illness  62 y.o. male [facility] with abdominal pain, nausea and vomiting. CT and ultrasound abdomen unremarkable. Pt found with AKI and hypokalemia. PMH includes aflutter, HLD, HTN, CHF, DMII, GERD.  Clinical Impression  Pt presents to PT at or near his baseline for mobility. Pt is able to transfer and ambulate independently. Pt's current complaints are pain from R shoulder spasms which are chronic and resolve with medications. Pt denies other concerns regarding functional mobility at this time. PT encourages frequent mobilization during this admission. Pt has no further acute PT needs. PT signing off.        If plan is discharge home, recommend the following:     Can travel by private vehicle        Equipment Recommendations None recommended by PT  Recommendations for Other Services       Functional Status Assessment Patient has not had a recent decline in their functional status     Precautions / Restrictions Precautions Precautions: Other (comment) Precaution Comments: enteric Restrictions Weight Bearing Restrictions: No      Mobility  Bed Mobility                    Transfers Overall transfer level: Independent                      Ambulation/Gait Ambulation/Gait assistance: Modified independent (Device/Increase time) Gait Distance (Feet): 200 Feet Assistive device: None Gait Pattern/deviations: Step-through pattern Gait velocity: functional Gait velocity interpretation: 1.31 - 2.62 ft/sec, indicative of limited community ambulator   General Gait Details: slowed step-through gait, no balance deviations noted  Stairs            Wheelchair Mobility     Tilt Bed    Modified Rankin (Stroke Patients Only)       Balance Overall balance assessment: Independent                                            Pertinent Vitals/Pain Pain Assessment Pain Assessment: Faces Faces Pain Scale: Hurts even more Pain Location: R shoulder and face 2/2 spasms Pain Descriptors / Indicators: Spasm Pain Intervention(s): Monitored during session    Home Living Family/patient expects to be discharged to:: Private residence Living Arrangements: Spouse/significant other Available Help at Discharge: Family Type of Home: House Home Access: Stairs to enter Entrance Stairs-Rails: Can reach both Entrance Stairs-Number of Steps: 3   Home Layout: One level Home Equipment: None      Prior Function Prior Level of Function : Independent/Modified Independent             Mobility Comments: enjoys riding his harley       Extremity/Trunk Assessment   Upper Extremity Assessment Upper Extremity Assessment: RUE deficits/detail RUE Deficits / Details: Pt with spasms in R shoulder which are chronic, otherwise [facility]    Lower Extremity Assessment Lower Extremity Assessment: Overall WFL for tasks assessed    Cervical / Trunk Assessment Cervical / Trunk Assessment: Normal  Communication   Communication Communication: No apparent difficulties Cueing Techniques: Verbal cues  Cognition Arousal: Alert Behavior During Therapy: WFL for tasks assessed/performed Overall Cognitive Status: Within Functional Limits for tasks assessed  General Comments General comments (skin integrity, edema, etc.): VSS on RA    Exercises     Assessment/Plan    PT Assessment Patient does not need any further PT services  PT Problem List         PT Treatment Interventions      PT Goals (Current goals can be found in the Care Plan section)       Frequency       Co-evaluation               AM-PAC PT "6 Clicks" Mobility  Outcome Measure Help needed turning from your back to your side while in a  flat bed without using bedrails?: None Help needed moving from lying on your back to sitting on the side of a flat bed without using bedrails?: None Help needed moving to and from a bed to a chair (including a wheelchair)?: None Help needed standing up from a chair using your arms (e.g., wheelchair or bedside chair)?: None Help needed to walk in hospital room?: None Help needed climbing 3-5 steps with a railing? : None 6 Click Score: 24    End of Session   Activity Tolerance: Patient tolerated treatment well Patient left: in bed;with call bell/phone within reach Nurse Communication: Mobility status PT Visit Diagnosis: Other symptoms and signs involving the nervous system (R29.898)    Time: 1610-9604 PT Time Calculation (min) (ACUTE ONLY): 13 min   Charges:   PT Evaluation $PT Eval Low Complexity: 1 Low   PT General Charges $$ ACUTE PT VISIT: 1 Visit         Arlyss Gandy, PT, DPT Acute Rehabilitation Office [phone]   Arlyss Gandy 11/20/2022, 10:44 AM

## 2022-11-20 NOTE — Progress Notes (Signed)
FMTS Brief Progress Note  S: Patient states abdominal pain is intermittent, currently not having any abdominal pain.  No complaints at this time.   O: BP (!) 141/86 (BP Location: Left Arm)   Pulse (!) 58   Temp 97.9 F (36.6 C) (Axillary)   Resp 18   Ht 5\' 11"  (1.803 m)   Wt 114.7 kg   SpO2 100%   BMI 35.27 kg/m    General: Patient lying in bed, NAD Respiratory: BiPAP in place which he wears nightly  A/P: Abdominal pain RUQ [place] is consistent with previous CT showing gallbladder wall thickening. -Continue plan per day team's H&P, no changes   Erick Alley, DO 11/20/2022, 3:10 AM PGY-3, [place] [facility] Night Resident  Please page [phone] with questions.

## 2022-11-20 NOTE — Assessment & Plan Note (Addendum)
Cr 2.25 on admission (baseline ~1.2), down slightly to 2.06 this morning. Most likely prerenal 2/2 N/V. May also be related to starting Entresto.   - s/p 1.5L LR in ED - encourage PO today - post void residual - Avoid NSAIDs and nephrotoxic drugs until AKI improves

## 2022-11-20 NOTE — Progress Notes (Signed)
Daily Progress Note Intern Pager: [phone]  Patient name: Isaac Valdez Medical record number: [id number] Date of birth: 1960-08-04 Age: 62 y.o. Gender: male  Primary Care Provider: Jerrell Mylar, PA-C Consultants: none Code Status: Full  Pt Overview and Major Events to Date:  9/12 - admitted  Assessment and Plan:  62 year old with past medical history CHF, DM, GERD, hypertension who presented with generalized abdominal pain and nausea and vomiting, admitted due to AKI.  Very nonspecific and generalized abdominal pain that has been going on for months but worsened 2 to 3 weeks ago.  Associated nausea, vomiting, loss of appetite, weight loss.  Could be due to gallbladder wall thickening.  Could also be side effect of Entresto.  Other differentials include IBS, Crohn's disease, celiac disease, acs, malignancy. Creatinine slightly down to 2.06 today.  Will encourage p.o. intake today, and follow-up on this tomorrow morning. GI panel pending.  Ordered VQ scan due to shortness of breath that began last week.  Also ordered troponins due to new PVCs seen on EKG yesterday.  Potential left-sided chest pain, patient unsure if this is in his chest or coming from his abdomen. Will consult GI for additional recommendations.   Assessment & Plan AKI (acute kidney injury) (HCC) Cr 2.25 on admission (baseline ~1.2), down slightly to 2.06 this morning. Most likely prerenal 2/2 N/V. May also be related to starting Entresto.   - s/p 1.5L LR in ED - encourage PO today - post void residual - Avoid NSAIDs and nephrotoxic drugs until AKI improves Abdominal pain Nonspecific, generalized, has been going on for a couple months but worsened over the last 2 to 3 weeks.  Unable to tolerate much p.o. intake, and he has a loss of appetite. - Regular diet  - RUQ [place] circumferential gallbladder wall thickening and edema - CT A/P with small hiatal hernia, mild nonspecific gallbladder wall thickening as  above - f/u GIPP - TSH 1.021 - New PVCs on EKG yesterday. New one ordered today. - Trop 26, repeat pending - V/Q scan - consult GI, appreciate recs - Tylenol prn for pain - Zofran prn for N/V - Protonix 40mg  daily Hypokalemia K 3.2 on admission likely in the setting of nausea and vomiting and minimal PO intake. Repleted yesterday, now 4.3.  - Monitor with BMP - Replete as necessary Hypomagnesemia Mg 1.6, repleted yesterday, waiting on AM lab draw - s/p 2g IV Mg in ED - AM Mg, replete as needed Type 2 diabetes mellitus with other specified complication (HCC) CBG this morning 114. - CBGs before meals and at bedtime - SSI - Hold home glipizide  Chronic and Stable Problems:  CHF: On Entresto. Cardiomegaly on XR w/o concern for pulmonary edema or pleural effusion.  HTN  Aflutter: On Coreg 25 mg, not anticoagulated due to low chadvasc score HLD: On Lipitor  FEN/GI: regular PPx: lovenox Dispo:Home pending clinical improvement    Subjective:  Patient admitted yesterday.  States that for the last few months, probably since June, he has had intermittent nausea, vomiting, abdominal pain.  He also reports loss of appetite and 40 to 50 pound weight loss in the last year.  Patient was supposed to get a colonoscopy last week but it was not done due to his cardiac history and he did not have clearance from his cardiologist.  He also reports shortness of breath that began last week.  States that it is worse with exertion.  Also has some left sided rib pain, he  states that this could be abdominal pain radiating to his ribs. Patient has a family history of cancer but does not know what kind.  He quit smoking 10 years ago, smoked around 1 pack/day prior to that.  Only smokes cigars on the weekends now.  Drinks alcohol on the weekends occasionally.  No drug use. Patient did have a headache yesterday.  States he does not normally get headaches, but it has resolved at this time.  Denies vision changes,  numbness, tingling, weakness. Patient denies leg swelling.  He was able to tolerate some p.o. intake last night.  Objective: Temp:  [97.4 F (36.3 C)-97.9 F (36.6 C)] 97.4 F (36.3 C) (09/13 0859) Pulse Rate:  [58-73] 67 (09/13 0859) Resp:  [15-20] 16 (09/13 0859) BP: (141-172)/(73-139) 158/73 (09/13 0859) SpO2:  [97 %-100 %] 100 % (09/13 0859) FiO2 (%):  [21 %] 21 % (09/12 2311) Weight:  [114.7 kg] 114.7 kg (09/12 2129) Physical Exam: General: No acute distress, intermittent right sided movements consistent with tardive dyskinesia Cardiovascular: Regular rate and rhythm, no murmurs Respiratory: Clear to auscultation bilaterally, no increased work of breathing Abdomen: Diffusely soft, nontender.  No obvious distention. Extremities: No leg swelling bilateral lower extremities  Laboratory: Most recent CBC Lab Results  Component Value Date   WBC 4.0 11/20/2022   HGB 12.4 (L) 11/20/2022   HCT 39.4 11/20/2022   MCV 95.2 11/20/2022   PLT 263 11/20/2022   Most recent BMP    Latest Ref Rng & Units 11/20/2022   10:10 AM  BMP  Glucose 70 - 99 mg/dL 161   BUN 6 - 20 mg/dL 32   Creatinine 0.96 - 1.24 mg/dL 0.45   Sodium 409 - 811 mmol/L 140   Potassium 3.5 - 5.1 mmol/L 4.3   Chloride 98 - 111 mmol/L 110   CO2 22 - 32 mmol/L 22   Calcium 8.9 - 10.3 mg/dL 9.0    Other pertinent labs  Mag 1.6   Imaging/Diagnostic Tests: CT A/P 11/19/22 IMPRESSION: 1. Small hiatal hernia. 2. Mild nonspecific gallbladder wall thickening, likely due to decompressed state. If gallbladder pathology is suspected, right upper quadrant ultrasound could be considered. 3. Minimal nonspecific free fluid within the lower abdomen and pelvis, unchanged since prior exam. 4. Stable cardiomegaly. 5.  Aortic Atherosclerosis (ICD10-I70.0).  CXR 11/19/22 IMPRESSION: Cardiomegaly without evidence of pulmonary edema or pleural effusion.  Tel Hevia, DO 11/20/2022, 12:48 PM  PGY-1, [facility]  Family Medicine FPTS Intern pager: [phone], text pages welcome Secure chat group [facility] Teaching Service

## 2022-11-20 NOTE — Assessment & Plan Note (Signed)
Mg 1.6, repleted yesterday, waiting on AM lab draw - s/p 2g IV Mg in ED - AM Mg, replete as needed

## 2022-11-20 NOTE — Assessment & Plan Note (Addendum)
K 3.2 on admission likely in the setting of nausea and vomiting and minimal PO intake. Repleted yesterday, now 4.3.  - Monitor with BMP - Replete as necessary

## 2022-11-20 NOTE — Consult Note (Signed)
Cardiology Consultation   Patient ID: Isaac Valdez MRN: [id number]; DOB: 1960/11/10  Admit date: 11/19/2022 Date of Consult: 11/20/2022  PCP:  Jerrell Mylar, PA-C   [facility] Providers Cardiologist:  Nanetta Batty, MD  Electrophysiologist:  Will Jorja Loa, MD       Patient Profile:   Isaac Valdez is a 62 y.o. male with a hx of CHF who is being seen 11/20/2022 for the evaluation of preoperative clearance at the request of Dr Rexene Alberts.  History of Present Illness:   Isaac Valdez has chronic systolic heart failure and is admitted with nausea and epigastric pain.  He has also been noted to have unintentional weight loss.  He is under evaluation by GI who is considering upper and lower endoscopy and cardiology clearance is requested for those procedures.  Comorbidities include type 2 diabetes, hypertension, and stage III chronic kidney disease.  Pertinent findings from his admission labs include signs of acute kidney injury with a creatinine of 2.25, previous baseline around 1.3-1.4.  Troponin is minimally elevated with a high-sensitivity troponin of 26 and 23, clinically insignificant.  LVEF has previously been documented at 45 to 50% but he has not had an echo at least in our system in over 5 years.  Outside records are reviewed and a cardiac catheterization from August 11, 2022 shows patent coronary arteries with minimal nonobstructive coronary artery disease.  Catheterization was prompted by an abnormal stress test that showed an LVEF of 31%.  However, an echocardiogram from Jul 29, 2022 showed normal LVEF of 55 to 60%, question of posterior wall hypokinesis, and stage 3-4 diastolic dysfunction was suggested.  The left atrium is severely enlarged.  There is mild to moderate mitral regurgitation and mild to moderate tricuspid regurgitation.  The patient is independently interviewed and examined.  He is resting comfortably in bed on no supplemental oxygen and able  to lie flat.  He complains of abdominal discomfort, early satiety, and weight loss.  He denies any leg edema.  He says that some of his symptoms feel like what he previously experienced when he had heart failure, but he was placed on spironolactone and loop diuretic therapy and the symptoms improved.  He is not having any chest pain or pressure.  He denies fevers or chills.  Past Medical History:  Diagnosis Date   Arthritis    Back pain    CHF (congestive heart failure) (HCC)    Diabetes mellitus    Dyspnea    GERD (gastroesophageal reflux disease)    Gout    High cholesterol    Hypertension    Sleep apnea    uses BIPAP nightly    Past Surgical History:  Procedure Laterality Date   A-FLUTTER ABLATION N/A 04/21/2017   Procedure: A-FLUTTER ABLATION;  Surgeon: Regan Lemming, MD;  Location: [facility];  Service: Cardiovascular;  Laterality: N/A;   BACK SURGERY     MANDIBLE FRACTURE SURGERY     SHOULDER ARTHROSCOPY WITH BICEPSTENOTOMY Right 08/15/2020   Procedure: SHOULDER ARTHROSCOPY WITH BICEPSTENOTOMY;  Surgeon: Bjorn Pippin, MD;  Location: [facility];  Service: Orthopedics;  Laterality: Right;   SHOULDER ARTHROSCOPY WITH ROTATOR CUFF REPAIR Right 08/15/2020   Procedure: SHOULDER ARTHROSCOPY WITH ARTHROSCOPIC ROTATOR CUFF REPAIR;  Surgeon: Bjorn Pippin, MD;  Location: [facility];  Service: Orthopedics;  Laterality: Right;     Home Medications:  Prior to Admission medications   Medication Sig Start Date End Date Taking? Authorizing  Provider  acetaminophen (TYLENOL) 500 MG tablet Take 1,000 mg by mouth 2 (two) times daily as needed for moderate pain.   Yes [provider]  albuterol (VENTOLIN HFA) 108 (90 Base) MCG/ACT inhaler Inhale 1-2 puffs into the lungs every 6 (six) hours as needed for wheezing or shortness of breath. 03/11/22  Yes Long, Arlyss Repress, MD  atorvastatin (LIPITOR) 20 MG tablet Take 20 mg by mouth daily.   Yes [provider]  carvedilol (COREG) 25 MG tablet Take 1 tablet by mouth twice daily 05/19/21  Yes Camnitz, Will Daphine Deutscher, MD  clonazePAM (KLONOPIN) 0.5 MG tablet Take 1 tablet (0.5 mg total) by mouth as needed. Patient taking differently: Take 0.5 mg by mouth 2 (two) times daily. 08/15/20  Yes McBane, Jerald Kief, PA-C  colchicine 0.6 MG tablet Take as directed for gout flare. 07/16/21  Yes Molpus, John, MD  esomeprazole (NEXIUM) 40 MG capsule Take 40 mg by mouth daily at 12 noon.   Yes [provider]  glipiZIDE (GLUCOTROL) 5 MG tablet Take 5 mg by mouth daily as needed (CBG >120).  08/12/16  Yes [provider]  sacubitril-valsartan (ENTRESTO) 24-26 MG Take 1 tablet by mouth 2 (two) times daily.   Yes [provider]    Inpatient Medications: Scheduled Meds:  carvedilol  25 mg Oral BID   diphenhydrAMINE  25 mg Oral BID   enoxaparin (LOVENOX) injection  60 mg Subcutaneous Q24H   feeding supplement  237 mL Oral BID BM   insulin aspart  0-9 Units Subcutaneous TID WC   pantoprazole  40 mg Oral Daily   Continuous Infusions:  PRN Meds: acetaminophen **OR** acetaminophen, clonazePAM, ondansetron  Allergies:    Allergies  Allergen Reactions   Asa [Aspirin] Other (See Comments)    Indigestion Stomach discomfort    Social History:   Social History   Socioeconomic History   Marital status: Married    Spouse name: Not on file   Number of children: 2   Years of education: 11   Highest education level: 11th grade  Occupational History   Occupation: part time truck driver   Occupation: on disability  Tobacco Use   Smoking status: Some Days    Current packs/day: 0.50    Types: Cigars, Cigarettes   Smokeless tobacco: Never   Tobacco comments:    occais cigar  Vaping Use   Vaping status: Never Used  Substance and Sexual Activity   Alcohol use: Yes    Comment: social   Drug use: No   Sexual activity: Yes    Birth control/protection: None  Other Topics  Concern   Not on file  Social History Narrative   Lives with wife in a one story home.  Has 2 children.     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     Education: 11th grade.    Right handed   Social Determinants of Health   Financial Resource Strain: Low Risk  (07/21/2022)   Received from [facility]   Overall Financial Resource Strain (CARDIA)    Difficulty of Paying Living Expenses: Not hard at all  Food Insecurity: No Food Insecurity (11/19/2022)   Hunger Vital Sign    Worried About Running Out of Food in the Last Year: Never true    Ran Out of Food in the Last Year: Never true  Transportation Needs: No Transportation Needs (11/19/2022)   PRAPARE - Administrator, Civil Service (Medical): No  Lack of Transportation (Non-Medical): No  Physical Activity: Not on file  Stress: No Stress Concern Present (08/11/2022)   Received from [facility] Occup[facility] - Occupational Stress Questionnaire    Feeling of Stress : Only a little  Social Connections: Unknown (07/20/2021)   Received from [facility]   Social Network    Social Network: Not on file  Intimate Partner Violence: Not At Risk (11/19/2022)   Humiliation, Afraid, Rape, and Kick questionnaire    Fear of Current or Ex-Partner: No    Emotionally Abused: No    Physically Abused: No    Sexually Abused: No    Family History:   Family History  Problem Relation Age of Onset   Heart disease Mother    Hypertension Sister    Other Father        hypothermia   Heart failure Son      ROS:  Please see the history of present illness.  All other ROS reviewed and negative.     Physical Exam/Data:   Vitals:   11/20/22 0006 11/20/22 0406 11/20/22 0859 11/20/22 1540  BP: (!) 141/86 (!) 149/94 (!) 158/73 (!) 160/78  Pulse: (!) 58 (!) 58 67 68  Resp: 18 18 16 16   Temp: 97.9 F (36.6 C) (!) 97.5 F (36.4 C) (!) 97.4 F (36.3 C) 97.8 F (36.6 C)   TempSrc: Axillary Axillary Oral Oral  SpO2: 100% 100% 100% 100%  Weight:      Height:        Intake/Output Summary (Last 24 hours) at 11/20/2022 1737 Last data filed at 11/19/2022 2309 Gross per 24 hour  Intake 1574.52 ml  Output --  Net 1574.52 ml      11/19/2022    9:29 PM 11/19/2022   11:04 AM 04/23/2022    7:01 PM  Last 3 Weights  Weight (lbs) 252 lb 13.9 oz 250 lb 259 lb 14.8 oz  Weight (kg) 114.7 kg 113.399 kg 117.9 kg     Body mass index is 35.27 kg/m.  General:  Well nourished, well developed, overweight male in no acute distress HEENT: normal Neck: no JVD but difficult to evaluate neck veins due to body habitus Vascular: No carotid bruits; Distal pulses 2+ bilaterally Cardiac:  normal S1, S2; RRR; no murmur  Lungs:  clear to auscultation bilaterally, no wheezing, rhonchi or rales  Abd: soft, distended, diffusely tender but no rebound or guarding Ext: no edema Musculoskeletal:  No deformities, BUE and BLE strength normal and equal Skin: warm and dry  Neuro:  CNs 2-12 intact, no focal abnormalities noted Psych:  Normal affect   EKG:  The EKG was personally reviewed and demonstrates: Normal sinus rhythm 75 bpm, nonspecific ST abnormality, PVCs are present   Relevant CV Studies: Outside cardiac cath and echo studies are reviewed as outlined in the HPI  Laboratory Data:  High Sensitivity Troponin:   Recent Labs  Lab 11/20/22 1010 11/20/22 1447  TROPONINIHS 26* 23*     Chemistry Recent Labs  Lab 11/19/22 1111 11/19/22 1200 11/20/22 1010 11/20/22 1447  NA 139  --  140  --   K 3.2*  --  4.3  --   CL 108  --  110  --   CO2 19*  --  22  --   GLUCOSE 139*  --  112*  --   BUN 29*  --  32*  --   CREATININE 2.25*  --  2.06*  --  CALCIUM 8.9  --  9.0  --   MG  --  1.6*  --  1.9  GFRNONAA 33*  --  36*  --   ANIONGAP 12  --  8  --     Recent Labs  Lab 11/19/22 1111 11/20/22 1010  PROT 7.0 7.0  ALBUMIN 3.7 3.6  AST 24 25  ALT 22 25  ALKPHOS 61 62   BILITOT 1.0 0.9   Lipids No results for input(s): "CHOL", "TRIG", "HDL", "LABVLDL", "LDLCALC", "CHOLHDL" in the last 168 hours.  Hematology Recent Labs  Lab 11/19/22 1111 11/20/22 1010  WBC 4.4 4.0  RBC 3.84* 4.14*  HGB 11.6* 12.4*  HCT 36.4* 39.4  MCV 94.8 95.2  MCH 30.2 30.0  MCHC 31.9 31.5  RDW 17.1* 17.1*  PLT 259 263   Thyroid  Recent Labs  Lab 11/20/22 1010  TSH 1.021    BNPNo results for input(s): "BNP", "PROBNP" in the last 168 hours.  DDimer No results for input(s): "DDIMER" in the last 168 hours.   Radiology/Studies:  NM Pulmonary Perfusion  Result Date: 11/20/2022 CLINICAL DATA:  Chronic dyspnea.  Evaluate for pulmonary embolism. EXAM: NUCLEAR MEDICINE PERFUSION LUNG SCAN TECHNIQUE: Perfusion images were obtained in multiple projections after intravenous injection of radiopharmaceutical. Ventilation scans intentionally deferred if perfusion scan and chest x-ray adequate for interpretation during COVID 19 epidemic. RADIOPHARMACEUTICALS:  4.4 mCi Tc-39m MAA IV COMPARISON:  Chest radiograph from 11/19/2022 . FINDINGS: On the comparison chest radiograph there is no focal pulmonary opacities. The cardiac silhouette is enlarged. No peripheral, segmental perfusion defects to indicate acute pulmonary embolism. Attenuation artifact from enlarged cardiac silhouette noted over the lower left lung. IMPRESSION: No segmental perfusion defects to indicate presence of pulmonary embolism. Electronically Signed   By: Signa Kell M.D.   On: 11/20/2022 15:50   US Abdomen Limited RUQ (LIVER/GB)  Result Date: 11/19/2022 CLINICAL DATA:  Unspecified abdominal pain EXAM: ULTRASOUND ABDOMEN LIMITED RIGHT UPPER QUADRANT COMPARISON:  11/19/2022 FINDINGS: Gallbladder: The gallbladder wall is circumferentially thickened and edematous, similar to that noted on prior CT examination. The gallbladder, however, is not distended no intraluminal stones or sludge is identified and no pericholecystic  fluid collections are seen. The sonographic Eulah Pont sign is reportedly negative. Common bile duct: Diameter: 6 mm in proximal diameter Liver: The liver is normal in size. Normal parenchymal echogenicity and echotexture. No focal intrahepatic masses or intrahepatic biliary ductal dilation is seen. Portal vein is patent on color Doppler imaging with normal direction of blood flow towards the liver. Other: None. IMPRESSION: 1. Circumferential gallbladder wall thickening and edema, similar to that noted on prior CT examination. This is nonspecific and can be seen in the setting of biliary or pancreatic inflammation, passive congestion, or hypoproteinemia. Correlation with liver enzymes may be helpful for further evaluation. Electronically Signed   By: Helyn Numbers M.D.   On: 11/19/2022 22:00   CT ABDOMEN PELVIS WO CONTRAST  Result Date: 11/19/2022 CLINICAL DATA:  Acute abdominal pain for several weeks, history of GERD EXAM: CT ABDOMEN AND PELVIS WITHOUT CONTRAST TECHNIQUE: Multidetector CT imaging of the abdomen and pelvis was performed following the standard protocol without IV contrast. RADIATION DOSE REDUCTION: This exam was performed according to the departmental dose-optimization program which includes automated exposure control, adjustment of the mA and/or kV according to patient size and/or use of iterative reconstruction technique. COMPARISON:  09/27/2016 FINDINGS: Lower chest: No acute pleural or parenchymal lung disease. Stable cardiomegaly. Hepatobiliary: Unremarkable unenhanced appearance of the liver.  The gallbladder is decompressed with nonspecific gallbladder wall thickening. No pericholecystic fat stranding or calcified gallstones. No biliary duct dilation. Pancreas: Unremarkable unenhanced appearance. Spleen: Unremarkable unenhanced appearance. Adrenals/Urinary Tract: No urinary tract calculi or obstructive uropathy within either kidney. The adrenals and bladder are unremarkable. Stomach/Bowel: No  bowel obstruction or ileus. Normal appendix right lower quadrant. No bowel wall thickening or inflammatory change. Small hiatal hernia. Vascular/Lymphatic: Aortic atherosclerosis. No enlarged abdominal or pelvic lymph nodes. Reproductive: Prostate is unremarkable. Other: Trace nonspecific free fluid within the bilateral flanks and lower pelvis. No free intraperitoneal gas. No abdominal wall hernia. Musculoskeletal: No acute or destructive bony abnormalities. Postsurgical changes at L4-5 again noted. Reconstructed images demonstrate no additional findings. IMPRESSION: 1. Small hiatal hernia. 2. Mild nonspecific gallbladder wall thickening, likely due to decompressed state. If gallbladder pathology is suspected, right upper quadrant ultrasound could be considered. 3. Minimal nonspecific free fluid within the lower abdomen and pelvis, unchanged since prior exam. 4. Stable cardiomegaly. 5.  Aortic Atherosclerosis (ICD10-I70.0). Electronically Signed   By: Sharlet Salina M.D.   On: 11/19/2022 17:01   DG Chest Portable 1 View  Result Date: 11/19/2022 CLINICAL DATA:  Evaluate fluid status EXAM: PORTABLE CHEST 1 VIEW COMPARISON:  Chest radiograph 11/13/2022 FINDINGS: The heart is enlarged, unchanged. The upper mediastinal contours are normal There is no definite overt pulmonary edema. There is no focal airspace consolidation. There is no pleural effusion or pneumothorax There is no acute osseous abnormality. IMPRESSION: Cardiomegaly without evidence of pulmonary edema or pleural effusion. Electronically Signed   By: Lesia Hausen M.D.   On: 11/19/2022 16:02     Assessment and Plan:   Acute on chronic diastolic heart failure Minimal troponin elevation, not clinically significant Acute on chronic kidney injury Abdominal pain/weight loss/early satiety pending endoscopic GI evaluation  Volume status difficult to ascertain based on his body habitus, but he is actually losing weight per his report.  He does have  abdominal distention and I am not sure if this is partially related to heart failure or purely from GI pathology.  Will add a BNP to his blood work and check an echocardiogram.  He does have severe diastolic dysfunction described on his outside echocardiogram with normal LVEF.  I would avoid aggressive diuresis in the setting of his acute kidney injury.  I also would not load him with fluids as he will be at high risk of progressive heart failure.  From a standpoint of cardiovascular risk with upper and lower endoscopy, the patient is not acutely decompensated.  He is on room air and not orthopneic.  He has no signs of low output.  I think he can proceed with EGD and colonoscopy without high cardiac risk of complication.  From a perspective of cardiac workup, I am going to check a BNP and updated 2D echocardiogram to reevaluate LV and RV function.  Our team will follow with you.   Risk Assessment/Risk Scores:        New York Heart Association (NYHA) Functional Class NYHA Class III        For questions or updates, please contact [facility] Please consult [email or web address] for contact info under    Signed, Tonny Bollman, MD  11/20/2022 5:37 PM

## 2022-11-20 NOTE — Evaluation (Signed)
Occupational Therapy Evaluation Patient Details Name: Isaac Valdez MRN: [id number] DOB: [birth year] Today's Date: 11/20/2022   History of Present Illness 62 y.o. male presents to [facility] hospital on 11/19/2022 with abdominal pain, nausea and vomiting. CT and ultrasound abdomen unremarkable. Pt found with AKI and hypokalemia. PMH includes aflutter, HLD, HTN, CHF, DMII, GERD.   Clinical Impression   PTA, pt independent and living with wife. Upon eval, pt mod I for ADL and IADL for increased time and rest breaks. Pt with R shoulder spasm with trap/SCM activation and facial involvement which he reports has developed over the past ~5 years. Pt does have some weakness on the R. Engaged in cervical stretches and gentle manual therapy to traps/SCM. Pt educated regarding compensatory techniques for LB ADL to reduce stomach pain. Will follow acutely for RUE HEP/stretching program and energy conservation education. Do not suspect need for follow up OT at discharge, however, did encourage pt to seek further work up for spasms.       If plan is discharge home, recommend the following: Other (comment) (on pt request)    Functional Status Assessment  Patient has had a recent decline in their functional status and demonstrates the ability to make significant improvements in function in a reasonable and predictable amount of time.  Equipment Recommendations  None recommended by OT    Recommendations for Other Services       Precautions / Restrictions Precautions Precautions: Other (comment) Precaution Comments: enteric Restrictions Weight Bearing Restrictions: No      Mobility Bed Mobility Overal bed mobility: Independent                  Transfers Overall transfer level: Independent                        Balance Overall balance assessment: Independent                                         ADL either performed or assessed with clinical judgement   ADL  Overall ADL's : Modified independent                                             Vision   Vision Assessment?: No apparent visual deficits     Perception Perception: Not tested       Praxis Praxis: Not tested       Pertinent Vitals/Pain Pain Assessment Pain Assessment: Faces Faces Pain Scale: Hurts even more Pain Location: R shoulder and face 2/2 spasms Pain Descriptors / Indicators: Spasm Pain Intervention(s): Limited activity within patient's tolerance     Extremity/Trunk Assessment Upper Extremity Assessment Upper Extremity Assessment: RUE deficits/detail RUE Deficits / Details: Pt with spasms in R shoulder which are chronic, otherwise [facility]   Lower Extremity Assessment Lower Extremity Assessment: Defer to PT evaluation   Cervical / Trunk Assessment Cervical / Trunk Assessment: Normal   Communication Communication Communication: No apparent difficulties Cueing Techniques: Verbal cues   Cognition Arousal: Alert Behavior During Therapy: WFL for tasks assessed/performed Overall Cognitive Status: Within Functional Limits for tasks assessed  General Comments       Exercises Exercises: Other exercises Other Exercises Other Exercises: cervical stretch to L and R. Pt with discomfort with cervical rotation to the R near spasm side. Cercvical flexionj/extension/lateral extension. Other Exercises: Gentle therapeutic massage to SCM and traps   Shoulder Instructions      Home Living Family/patient expects to be discharged to:: Private residence Living Arrangements: Spouse/significant other Available Help at Discharge: Family Type of Home: House Home Access: Stairs to enter Secretary/administrator of Steps: 3 Entrance Stairs-Rails: Can reach both Home Layout: One level     Bathroom Shower/Tub: Tub/shower unit         Home Equipment: Shower seat          Prior Functioning/Environment  Prior Level of Function : Independent/Modified Independent             Mobility Comments: enjoys riding his harley          OT Problem List: Decreased strength;Impaired balance (sitting and/or standing);Decreased activity tolerance;Cardiopulmonary status limiting activity      OT Treatment/Interventions: Self-care/ADL training;Therapeutic exercise;DME and/or AE instruction;Patient/family education;Balance training;Therapeutic activities    OT Goals(Current goals can be found in the care plan section) Acute Rehab OT Goals Patient Stated Goal: get better OT Goal Formulation: With patient Time For Goal Achievement: 12/04/22 Potential to Achieve Goals: Good  OT Frequency: Min 1X/week    Co-evaluation              AM-PAC OT "6 Clicks" Daily Activity     Outcome Measure Help from another person eating meals?: None Help from another person taking care of personal grooming?: None Help from another person toileting, which includes using toliet, bedpan, or urinal?: None Help from another person bathing (including washing, rinsing, drying)?: None Help from another person to put on and taking off regular upper body clothing?: None Help from another person to put on and taking off regular lower body clothing?: None 6 Click Score: 24   End of Session Nurse Communication: Mobility status  Activity Tolerance: Patient tolerated treatment well Patient left: in bed;with call bell/phone within reach  OT Visit Diagnosis: Unsteadiness on feet (R26.81);Muscle weakness (generalized) (M62.81)                Time: 6962-9528 OT Time Calculation (min): 28 min Charges:  OT General Charges $OT Visit: 1 Visit OT Evaluation $OT Eval Low Complexity: 1 Low OT Treatments $Therapeutic Activity: 8-22 mins  Isaac Valdez, OTR/L [facility] Acute Rehabilitation Office: [phone]   Isaac Valdez 11/20/2022, 5:39 PM

## 2022-11-20 NOTE — Assessment & Plan Note (Signed)
Mg 1.6 - s/p 2g IV Mg in ED - AM Mg, replete as needed

## 2022-11-20 NOTE — Consult Note (Addendum)
Consultation  Referring Provider:    Primary Care Physician:  Jerrell Mylar, PA-C Primary Gastroenterologist:  Gentry Fitz       Reason for Consultation:  Nausea, vomiting, abd pain, weight loss    DOA: 11/19/2022         Hospital Day: 2         HPI:   Isaac Valdez is a 62 y.o. 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   Presented to the ER with 4 week h/o intermittent nausea, vomiting, and epigastric abdominal pain, dyspnea on exertion with nonbloody diarrhea that started Wednesday. Admitted for AKI.  Work up notable in ED; hypertensive SBP 140s, afebrile, and otherwise normal v/s. Labs notable for AKI on CKD3 with Cr 2.25 (bl ~1.4-1.6), potassium 3.2, stable anemia Hb 11.6. Lipase, LFTs normal.   Patient reports for the past 3 weeks he has had worsening generalized abdominal pain with nausea and vomiting. He states as soon as he would eat or drink anything it felt as if it would stay mid chest or come back up. As a result his appetite has significantly decreased. He reports a 70lb weight loss over the past year. No hx of EGD. No recent travel. No exposure. Reports he was started on new medicine Entresto. He was started on Nexium a week ago without improvement in symptoms. He uses OTC Tylenol and Ibuprofen prn. No urination problems. He reports he typically has 1 regular BM daily, but in the past week has had three bouts of diarrhea. He was prepped last Friday for a colonoscopy which was not performed due to patient experiencing shortness of breath and cardiac issues. Last colonscopy was approx 5 years ago with [facility].  No reported family history of CA. Patient was former smoker, stopped 10 years ago but has 1-2 cigars a mth. Patient reports he drinks 1 shot with 2 beers few weekends a month socially. Admits to SOB with exertion and when lying flat.  Patient on Clear liquid diet  in hospital and tolerated.  11/20/2022 NUCLEAR MEDICINE PERFUSION LUNG SCAN   IMPRESSION: No segmental perfusion defects to indicate presence of pulmonary embolism.  11/19/22  US ABDOMEN- Circumferential gallbladder wall thickening and edema, similar to that noted on prior CT examination. This is nonspecific and can be seen in the setting of biliary or pancreatic inflammation, passive congestion, or hypoproteinemia  CT ABD PELVIS WO CONTRAST- shows small HH, mild nonspecific gallbladder wall thickening, minimal nonspecific free fluid within lower abd and pelvis, stable cardiomegaly, and aortic atherosclerosis.  CHEST XRAY-Cardiomegaly without evidence of pulmonary edema or pleural effusion.  Per note Care everywhere last colonoscopy 5 years ago with [facility] Dr. Noe Gens with findings of polyps.  No family was present at the time of my evaluation.  Abnormal ED labs: Abnormal Labs Reviewed  COMPREHENSIVE METABOLIC PANEL - Abnormal; Notable for the following components:      Result Value   Potassium 3.2 (*)    CO2 19 (*)    Glucose, Bld 139 (*)    BUN 29 (*)    Creatinine, Ser 2.25 (*)    GFR, Estimated 33 (*)    All other components within normal limits  CBC WITH DIFFERENTIAL/PLATELET - Abnormal; Notable for the following components:   RBC 3.84 (*)    Hemoglobin 11.6 (*)    HCT 36.4 (*)    RDW 17.1 (*)  All other components within normal limits  URINALYSIS, ROUTINE W REFLEX MICROSCOPIC - Abnormal; Notable for the following components:   APPearance HAZY (*)    Hgb urine dipstick SMALL (*)    Protein, ur 100 (*)    Bacteria, UA RARE (*)    All other components within normal limits  MAGNESIUM - Abnormal; Notable for the following components:   Magnesium 1.6 (*)    All other components within normal limits  COMPREHENSIVE METABOLIC PANEL - Abnormal; Notable for the following components:   Glucose, Bld 112 (*)    BUN 32 (*)    Creatinine, Ser 2.06 (*)    GFR, Estimated  36 (*)    All other components within normal limits  CBC - Abnormal; Notable for the following components:   RBC 4.14 (*)    Hemoglobin 12.4 (*)    RDW 17.1 (*)    All other components within normal limits  GLUCOSE, CAPILLARY - Abnormal; Notable for the following components:   Glucose-Capillary 129 (*)    All other components within normal limits  GLUCOSE, CAPILLARY - Abnormal; Notable for the following components:   Glucose-Capillary 114 (*)    All other components within normal limits  TROPONIN I (HIGH SENSITIVITY) - Abnormal; Notable for the following components:   Troponin I (High Sensitivity) 26 (*)    All other components within normal limits    Past Medical History:  Diagnosis Date   Arthritis    Back pain    CHF (congestive heart failure) (HCC)    Diabetes mellitus    Dyspnea    GERD (gastroesophageal reflux disease)    Gout    High cholesterol    Hypertension    Sleep apnea    uses BIPAP nightly    Surgical History:  He  has a past surgical history that includes Back surgery; Mandible fracture surgery; A-FLUTTER ABLATION (N/A, 04/21/2017); Shoulder arthroscopy with bicepstenotomy (Right, 08/15/2020); and Shoulder arthroscopy with rotator cuff repair (Right, 08/15/2020). Family History:  His family history includes Heart disease in his mother; Heart failure in his son; Hypertension in his sister; Other in his father. Social History:   reports that he has been smoking cigars. He has never used smokeless tobacco. He reports current alcohol use. He reports that he does not use drugs.  Prior to Admission medications   Medication Sig Start Date End Date Taking? Authorizing Provider  acetaminophen (TYLENOL) 500 MG tablet Take 1,000 mg by mouth 2 (two) times daily as needed for moderate pain.   Yes [provider]  albuterol (VENTOLIN HFA) 108 (90 Base) MCG/ACT inhaler Inhale 1-2 puffs into the lungs every 6 (six) hours as needed for wheezing or shortness of breath.  03/11/22  Yes Long, Arlyss Repress, MD  atorvastatin (LIPITOR) 20 MG tablet Take 20 mg by mouth daily.   Yes [provider]  carvedilol (COREG) 25 MG tablet Take 1 tablet by mouth twice daily 05/19/21  Yes Camnitz, Will Daphine Deutscher, MD  clonazePAM (KLONOPIN) 0.5 MG tablet Take 1 tablet (0.5 mg total) by mouth as needed. Patient taking differently: Take 0.5 mg by mouth 2 (two) times daily. 08/15/20  Yes McBane, Jerald Kief, PA-C  colchicine 0.6 MG tablet Take as directed for gout flare. 07/16/21  Yes Molpus, John, MD  esomeprazole (NEXIUM) 40 MG capsule Take 40 mg by mouth daily at 12 noon.   Yes [provider]  glipiZIDE (GLUCOTROL) 5 MG tablet Take 5 mg by mouth daily as needed (CBG >120).  08/12/16  Yes [provider]  sacubitril-valsartan (ENTRESTO) 24-26 MG Take 1 tablet by mouth 2 (two) times daily.   Yes [provider]    Current Facility-Administered Medications  Medication Dose Route Frequency Provider Last Rate Last Admin   acetaminophen (TYLENOL) tablet 650 mg  650 mg Oral Q6H PRN Lincoln Brigham, MD   650 mg at 11/19/22 2232   Or   acetaminophen (TYLENOL) suppository 650 mg  650 mg Rectal Q6H PRN Lincoln Brigham, MD       carvedilol (COREG) tablet 25 mg  25 mg Oral BID Lincoln Brigham, MD   25 mg at 11/20/22 1013   clonazePAM (KLONOPIN) tablet 0.5 mg  0.5 mg Oral BID PRN Erick Alley, DO   0.5 mg at 11/20/22 1028   diphenhydrAMINE (BENADRYL) capsule 25 mg  25 mg Oral BID Lincoln Brigham, MD   25 mg at 11/20/22 1028   enoxaparin (LOVENOX) injection 60 mg  60 mg Subcutaneous Q24H Billey Co, MD       feeding supplement (ENSURE ENLIVE / ENSURE PLUS) liquid 237 mL  237 mL Oral BID BM Caro Laroche, DO   237 mL at 11/20/22 1029   insulin aspart (novoLOG) injection 0-9 Units  0-9 Units Subcutaneous TID WC Lincoln Brigham, MD       ondansetron (ZOFRAN-ODT) disintegrating tablet 4 mg  4 mg Oral Q8H PRN Lincoln Brigham, MD       pantoprazole (PROTONIX) EC tablet 40 mg  40 mg Oral  Daily Lincoln Brigham, MD   40 mg at 11/20/22 1013    Allergies as of 11/19/2022 - Review Complete 11/19/2022  Allergen Reaction Noted   Asa [aspirin] Other (See Comments) 10/30/2010    Review of Systems:    Constitutional: No weight loss, fever, chills, weakness or fatigue HEENT: Eyes: No change in vision               Ears, Nose, Throat:  No change in hearing or congestion Skin: No rash or itching Cardiovascular: No chest pain, chest pressure or palpitations   Respiratory: No SOB or cough Gastrointestinal: See HPI and otherwise negative Genitourinary: No dysuria or change in urinary frequency Neurological: No headache, dizziness or syncope Musculoskeletal: No new muscle or joint pain Hematologic: No bleeding or bruising Psychiatric: No history of depression or anxiety     Physical Exam:  Vital signs in last 24 hours: Temp:  [97.4 F (36.3 C)-97.9 F (36.6 C)] 97.4 F (36.3 C) (09/13 0859) Pulse Rate:  [58-73] 67 (09/13 0859) Resp:  [15-20] 16 (09/13 0859) BP: (141-172)/(73-139) 158/73 (09/13 0859) SpO2:  [97 %-100 %] 100 % (09/13 0859) FiO2 (%):  [21 %] 21 % (09/12 2311) Weight:  [114.7 kg] 114.7 kg (09/12 2129) Last BM Date : 11/19/22 Last BM recorded by nurses in past 5 days No data recorded  General:   Pleasant, well developed male in no acute distress Head:  Normocephalic and atraumatic. Eyes: sclerae anicteric,conjunctive pink  Heart:  regular rate and rhythm, no murmurs or gallops Pulm: Clear anteriorly; no wheezing Abdomen:  Soft, Obese AB, Hyperactive, tinkling bowel sounds. mild tenderness in the epigastrium. Without guarding. Without rebound tenderness.No organomegaly appreciated. Extremities:  Without edema. Msk:  Symmetrical without gross deformities. Peripheral pulses intact.  Neurologic:  Alert and  oriented x4;  involuntary movements face and right shoulder  Skin:   Dry and intact without significant lesions or rashes. Psychiatric:  Cooperative. Normal  mood and affect.  LAB RESULTS: Recent Labs  11/19/22 1111 11/20/22 1010  WBC 4.4 4.0  HGB 11.6* 12.4*  HCT 36.4* 39.4  PLT 259 263   BMET Recent Labs    11/19/22 1111 11/20/22 1010  NA 139 140  K 3.2* 4.3  CL 108 110  CO2 19* 22  GLUCOSE 139* 112*  BUN 29* 32*  CREATININE 2.25* 2.06*  CALCIUM 8.9 9.0   LFT Recent Labs    11/20/22 1010  PROT 7.0  ALBUMIN 3.6  AST 25  ALT 25  ALKPHOS 62  BILITOT 0.9   PT/INR No results for input(s): "LABPROT", "INR" in the last 72 hours.  STUDIES: US Abdomen Limited RUQ (LIVER/GB)  Result Date: 11/19/2022 CLINICAL DATA:  Unspecified abdominal pain EXAM: ULTRASOUND ABDOMEN LIMITED RIGHT UPPER QUADRANT COMPARISON:  11/19/2022 FINDINGS: Gallbladder: The gallbladder wall is circumferentially thickened and edematous, similar to that noted on prior CT examination. The gallbladder, however, is not distended no intraluminal stones or sludge is identified and no pericholecystic fluid collections are seen. The sonographic Eulah Pont sign is reportedly negative. Common bile duct: Diameter: 6 mm in proximal diameter Liver: The liver is normal in size. Normal parenchymal echogenicity and echotexture. No focal intrahepatic masses or intrahepatic biliary ductal dilation is seen. Portal vein is patent on color Doppler imaging with normal direction of blood flow towards the liver. Other: None. IMPRESSION: 1. Circumferential gallbladder wall thickening and edema, similar to that noted on prior CT examination. This is nonspecific and can be seen in the setting of biliary or pancreatic inflammation, passive congestion, or hypoproteinemia. Correlation with liver enzymes may be helpful for further evaluation. Electronically Signed   By: Helyn Numbers M.D.   On: 11/19/2022 22:00   CT ABDOMEN PELVIS WO CONTRAST  Result Date: 11/19/2022 CLINICAL DATA:  Acute abdominal pain for several weeks, history of GERD EXAM: CT ABDOMEN AND PELVIS WITHOUT CONTRAST TECHNIQUE:  Multidetector CT imaging of the abdomen and pelvis was performed following the standard protocol without IV contrast. RADIATION DOSE REDUCTION: This exam was performed according to the departmental dose-optimization program which includes automated exposure control, adjustment of the mA and/or kV according to patient size and/or use of iterative reconstruction technique. COMPARISON:  09/27/2016 FINDINGS: Lower chest: No acute pleural or parenchymal lung disease. Stable cardiomegaly. Hepatobiliary: Unremarkable unenhanced appearance of the liver. The gallbladder is decompressed with nonspecific gallbladder wall thickening. No pericholecystic fat stranding or calcified gallstones. No biliary duct dilation. Pancreas: Unremarkable unenhanced appearance. Spleen: Unremarkable unenhanced appearance. Adrenals/Urinary Tract: No urinary tract calculi or obstructive uropathy within either kidney. The adrenals and bladder are unremarkable. Stomach/Bowel: No bowel obstruction or ileus. Normal appendix right lower quadrant. No bowel wall thickening or inflammatory change. Small hiatal hernia. Vascular/Lymphatic: Aortic atherosclerosis. No enlarged abdominal or pelvic lymph nodes. Reproductive: Prostate is unremarkable. Other: Trace nonspecific free fluid within the bilateral flanks and lower pelvis. No free intraperitoneal gas. No abdominal wall hernia. Musculoskeletal: No acute or destructive bony abnormalities. Postsurgical changes at L4-5 again noted. Reconstructed images demonstrate no additional findings. IMPRESSION: 1. Small hiatal hernia. 2. Mild nonspecific gallbladder wall thickening, likely due to decompressed state. If gallbladder pathology is suspected, right upper quadrant ultrasound could be considered. 3. Minimal nonspecific free fluid within the lower abdomen and pelvis, unchanged since prior exam. 4. Stable cardiomegaly. 5.  Aortic Atherosclerosis (ICD10-I70.0). Electronically Signed   By: Sharlet Salina M.D.    On: 11/19/2022 17:01   DG Chest Portable 1 View  Result Date: 11/19/2022 CLINICAL DATA:  Evaluate fluid status EXAM: PORTABLE CHEST 1  VIEW COMPARISON:  Chest radiograph 11/13/2022 FINDINGS: The heart is enlarged, unchanged. The upper mediastinal contours are normal There is no definite overt pulmonary edema. There is no focal airspace consolidation. There is no pleural effusion or pneumothorax There is no acute osseous abnormality. IMPRESSION: Cardiomegaly without evidence of pulmonary edema or pleural effusion. Electronically Signed   By: Lesia Hausen M.D.   On: 11/19/2022 16:02      Impression/Plan     62 y.o male patient presents with nausea,vomiting, sob, and epigastric/ abdominal pain. Reports it started about 3 weeks ago. WBC normal at 4.0.TSH normal 1.02. HIV negative. CT shows the gallbladder is decompressed with nonspecific gallbladder wall thickening. No pericholecystic fat stranding or calcified gallstones. No biliary duct dilation. No bowel obstruction or ileus. No bowel wall thickening or inflammatory change. Small H/H. Abd u/s shows circumferential gallbladder wall thickening and edema. No stones or sludge. Normal LFT's. Lipase normal. VQ scan negative. -Continue Pantoprazole 40 mg po -Antiemetics as needed -Consider egd/colonoscopy procedures after cardiac clearance received.  Diarrhea, acute episode. Last BM yesterday AM. No travel or exposure. Last colonoscopy 5 years ago with [facility] Dr. Noe Gens with findings of polyps. -GI panel pending  Anemia, chronic 11.6>12.4. Baseline 11-12. No overt bleeding. Negative VQ scan. -monitor H/H  Weight loss, patient reports initially he was trying to lose weight but recently his appetite has decreased due to Nausea, vomiting, and abd pain. -Consider endoscopic procedures after cardiac clearance received.  AKI on CKD3. Bun 29>32. Creatinine 2.25>2.06 -continue to monitor  Hypokalemia. 3.2>4.3 -replaced per protocol  Hypomagnesium.  1.6 yesterday -replaced per protocol.   CHF (on 11/13/22 at [facility]) Tropinin 31, BNP >4,700. Negative VQ scan -on Entresto -cardiac consult pending   A flutter s/p ablation 2019- On Coreg 25 mg, not anticoagulated due to low chadvasc score   Hypertension/HLD- On coreg and lipitor  DM type 2 -on insulin coverage per hospitalist  Thank you for your kind consultation, we will continue to follow.  Principal Problem:   AKI (acute kidney injury) (HCC) Active Problems:   Hypokalemia   Type 2 diabetes mellitus with other specified complication (HCC)   Abdominal pain   Chronic diastolic congestive heart failure (HCC)   Hypomagnesemia    LOS: 1 day    Deanna J May  11/20/2022, 2:15 PM    Attending physician's note  I have taken a history, reviewed the chart and examined the patient. I performed a substantive portion of this encounter, including complete performance of at least one of the key components, in conjunction with the APP. I agree with the APP's note, impression and recommendations.   62 year old male with history of a flutter status post ablation, CHF admitted with worsening dyspnea on exertion, abdominal pain, nausea vomiting and diarrhea with AKI Patient had significant weight loss over the last few months, he was scheduled for EGD and colonoscopy as outpatient but canceled because of dyspnea with exertion and chest pain. He had significantly elevated BNP when he was in the ER at [facility]  VQ scan negative for PE Anemia with heme positive stool, significant weight loss, will benefit with endoscopic evaluation [EGD and colonoscopy]  Will request cardiology workup/clearance prior to procedure Will tentatively plan for EGD and colonoscopy on Monday Continue diet as tolerated  The patient was provided an opportunity to ask questions and all were answered. The patient agreed with the plan and demonstrated an understanding of the instructions.  Iona Beard ,  MD [phone]

## 2022-11-20 NOTE — Assessment & Plan Note (Signed)
CBG this morning 114. - CBGs before meals and at bedtime - SSI - Hold home glipizide

## 2022-11-21 ENCOUNTER — Observation Stay (HOSPITAL_COMMUNITY): Payer: Medicare Other

## 2022-11-21 ENCOUNTER — Observation Stay (HOSPITAL_BASED_OUTPATIENT_CLINIC_OR_DEPARTMENT_OTHER): Payer: Medicare Other

## 2022-11-21 DIAGNOSIS — I5021 Acute systolic (congestive) heart failure: Secondary | ICD-10-CM

## 2022-11-21 DIAGNOSIS — I5032 Chronic diastolic (congestive) heart failure: Secondary | ICD-10-CM | POA: Diagnosis not present

## 2022-11-21 DIAGNOSIS — N179 Acute kidney failure, unspecified: Secondary | ICD-10-CM | POA: Diagnosis not present

## 2022-11-21 LAB — ECHOCARDIOGRAM COMPLETE
AR max vel: 2.25 cm2
AV Area VTI: 2.19 cm2
AV Area mean vel: 1.96 cm2
AV Mean grad: 5 mmHg
AV Peak grad: 7.5 mmHg
Ao pk vel: 1.37 m/s
Area-P 1/2: 3.81 cm2
Height: 71 in
P 1/2 time: 559 ms
S' Lateral: 5.5 cm
Weight: 4045.88 [oz_av]

## 2022-11-21 LAB — GLUCOSE, CAPILLARY
Glucose-Capillary: 92 mg/dL (ref 70–99)
Glucose-Capillary: 97 mg/dL (ref 70–99)
Glucose-Capillary: 104 mg/dL — ABNORMAL HIGH (ref 70–99)

## 2022-11-21 LAB — CBC
HCT: 35.6 % — ABNORMAL LOW (ref 39.0–52.0)
Hemoglobin: 11.2 g/dL — ABNORMAL LOW (ref 13.0–17.0)
MCH: 29.2 pg (ref 26.0–34.0)
MCHC: 31.5 g/dL (ref 30.0–36.0)
MCV: 92.7 fL (ref 80.0–100.0)
Platelets: 251 10*3/uL (ref 150–400)
RBC: 3.84 MIL/uL — ABNORMAL LOW (ref 4.22–5.81)
RDW: 17.2 % — ABNORMAL HIGH (ref 11.5–15.5)
WBC: 4.8 10*3/uL (ref 4.0–10.5)
nRBC: 0 % (ref 0.0–0.2)

## 2022-11-21 LAB — BRAIN NATRIURETIC PEPTIDE: B Natriuretic Peptide: 3209.1 pg/mL — ABNORMAL HIGH (ref 0.0–100.0)

## 2022-11-21 LAB — COMPREHENSIVE METABOLIC PANEL
ALT: 32 U/L (ref 0–44)
AST: 35 U/L (ref 15–41)
Albumin: 3.3 g/dL — ABNORMAL LOW (ref 3.5–5.0)
Alkaline Phosphatase: 67 U/L (ref 38–126)
Anion gap: 12 (ref 5–15)
BUN: 42 mg/dL — ABNORMAL HIGH (ref 6–20)
CO2: 18 mmol/L — ABNORMAL LOW (ref 22–32)
Calcium: 8.6 mg/dL — ABNORMAL LOW (ref 8.9–10.3)
Chloride: 108 mmol/L (ref 98–111)
Creatinine, Ser: 2.04 mg/dL — ABNORMAL HIGH (ref 0.61–1.24)
GFR, Estimated: 37 mL/min — ABNORMAL LOW (ref >60)
Glucose, Bld: 117 mg/dL — ABNORMAL HIGH (ref 70–99)
Potassium: 3.8 mmol/L (ref 3.5–5.1)
Sodium: 138 mmol/L (ref 135–145)
Total Bilirubin: 0.2 mg/dL — ABNORMAL LOW (ref 0.3–1.2)
Total Protein: 6.4 g/dL — ABNORMAL LOW (ref 6.5–8.1)

## 2022-11-21 LAB — MAGNESIUM: Magnesium: 1.9 mg/dL (ref 1.7–2.4)

## 2022-11-21 MED ORDER — SENNA 8.6 MG PO TABS
1.0000 | ORAL_TABLET | Freq: Every day | ORAL | Status: DC | PRN
  Administered 2022-11-21 – 2022-11-25 (×3): 8.6 mg via ORAL
  Filled 2022-11-21 (×3): qty 1

## 2022-11-21 MED ORDER — ACETAMINOPHEN 325 MG PO TABS
650.0000 mg | ORAL_TABLET | Freq: Once | ORAL | Status: AC
  Administered 2022-11-21: 650 mg via ORAL
  Filled 2022-11-21: qty 2

## 2022-11-21 MED ORDER — SIMETHICONE 80 MG PO CHEW
80.0000 mg | CHEWABLE_TABLET | Freq: Once | ORAL | Status: AC
  Administered 2022-11-21: 80 mg via ORAL
  Filled 2022-11-21: qty 1

## 2022-11-21 MED ORDER — POLYETHYLENE GLYCOL 3350 17 G PO PACK
17.0000 g | PACK | Freq: Every day | ORAL | Status: DC | PRN
  Administered 2022-11-25: 17 g via ORAL
  Filled 2022-11-21: qty 1

## 2022-11-21 MED ORDER — FUROSEMIDE 10 MG/ML IJ SOLN
60.0000 mg | Freq: Once | INTRAMUSCULAR | Status: AC
  Administered 2022-11-21: 60 mg via INTRAVENOUS
  Filled 2022-11-21: qty 8

## 2022-11-21 NOTE — Progress Notes (Signed)
Daily Progress Note Intern Pager: [phone]  Patient name: Isaac Valdez Medical record number: [id number] Date of birth: Mar 27, 1960 Age: 62 y.o. Gender: male  Primary Care Provider: Jerrell Mylar, PA-C Consultants: Cardiology, GI Code Status: Full  Pt Overview and Major Events to Date:  9/12-admitted  Assessment and Plan: 62 year old with past medical history CHF, DM, GERD, hypertension who presented with generalized abdominal pain, diarrhea, and nausea and vomiting, and AKI.  Also notes recent shortness of breath with exertion. Assessment & Plan AKI (acute kidney injury) (HCC) Cr 2.25 > 2.06> 2.04 (baseline ~1.2-4).  Likely related to heart failure exacerbation/worsening heart failure. Could be prerenal 2/2 N/V or related to starting Entresto. May be new baseline and new diagnosis of CKD.  Considered post renal causes including BPH, postvoid residual of 6 mL this morning. - s/p 1.5L LR in ED - avoid additional fluids  - Avoid NSAIDs and nephrotoxic drugs  - Consider renal ultrasound - urine microalbumin creatinine ratio   Abdominal pain Nonspecific, generalized, has been going on for a couple months but worsened over the last 2 to 3 weeks and associated with nausea, vomiting, diarrhea, and weight loss. Unable to tolerate much p.o. intake, and he has a loss of appetite. Has a normocytic anemia.  Was unable to complete colonoscopy recently due to shortness of breath.   RUQ [place] and CT A/P with nonspecific gallbladder wall thickening.  GI has been consulted. - f/u GIPP -GI following, appreciate recs: Plan for EGD and colonoscopy after optimized by cardiology - Tylenol prn for pain - Zofran prn for N/V - Protonix 40mg  daily Chronic diastolic congestive heart failure (HCC) CHF (stage 3 or 4 DD, EF 55-60% on 07/2022 echo). BNP 3,209 this morning.  Can consider worsening CHF as cause of SOB with exertion though no pulmonary edema on CXR and has lost weight.  Reassuringly he is  breathing comfortably on room air.  Echocardiogram pending. - Cont home Coreg -Cardiology consulted, appreciate recs - f/u echo - Holding Entresto and torsemide given AKI Hypokalemia K 3.2 on admission likely in the setting of nausea and vomiting and minimal PO intake.  Repleted and 3.8 this morning. - Monitor with BMP - Replete as necessary Hypomagnesemia Mg 1.6 on admission, repleted. 1.9 this morning.  - AM Mg, replete as needed Type 2 diabetes mellitus with other specified complication (HCC) CBG this morning 117 s/p 1 unit aspart yesterday. - CBGs before meals and at bedtime - SSI - Hold home glipizide   Chronic and Stable Issues: HTN  Aflutter: On Coreg 25 mg, not anticoagulated due to low chadvasc score HLD: On Lipitor  FEN/GI: Regular diet PPx: Lovenox Dispo: Home pending continued medical management  Subjective:  Still having abdominal pain but only when he moves around.  No concerns or complaints at this time.  Objective: Temp:  [97.4 F (36.3 C)-98.1 F (36.7 C)] 98.1 F (36.7 C) (09/13 2049) Pulse Rate:  [61-68] 61 (09/13 2049) Resp:  [16] 16 (09/13 1540) BP: (126-160)/(73-81) 126/81 (09/13 2049) SpO2:  [100 %] 100 % (09/13 2049) FiO2 (%):  [21 %] 21 % (09/13 2158) Physical Exam: General: 62 year old male, lying in bed with BiPAP on, wife at bedside Cardiovascular: Distant heart sounds, RRR Respiratory: Breathing comfortably with BiPAP on Abdomen: Bowel sounds present, soft, nontender to palpation, mildly distended Extremities: No edema BLEs  Laboratory: Most recent CBC Lab Results  Component Value Date   WBC 4.8 11/21/2022   HGB 11.2 (L) 11/21/2022  HCT 35.6 (L) 11/21/2022   MCV 92.7 11/21/2022   PLT 251 11/21/2022   Most recent BMP    Latest Ref Rng & Units 11/21/2022    2:20 AM  BMP  Glucose 70 - 99 mg/dL 147   BUN 6 - 20 mg/dL 42   Creatinine 8.29 - 1.24 mg/dL 5.62   Sodium 130 - 865 mmol/L 138   Potassium 3.5 - 5.1 mmol/L 3.8    Chloride 98 - 111 mmol/L 108   CO2 22 - 32 mmol/L 18   Calcium 8.9 - 10.3 mg/dL 8.6      Erick Alley, DO 11/21/2022, 4:59 AM  PGY-3, [facility] FPTS Intern pager: [phone], text pages welcome Secure chat group [facility] Teaching Service

## 2022-11-21 NOTE — Assessment & Plan Note (Signed)
Nonspecific, generalized, has been going on for a couple months but worsened over the last 2 to 3 weeks and associated with nausea, vomiting, diarrhea, and weight loss. Unable to tolerate much p.o. intake, and he has a loss of appetite. Has a normocytic anemia.  Was unable to complete colonoscopy recently due to shortness of breath.   RUQ [place] and CT A/P with nonspecific gallbladder wall thickening.  GI has been consulted. - f/u GIPP -GI following, appreciate recs: Plan for EGD and colonoscopy after optimized by cardiology - Tylenol prn for pain - Zofran prn for N/V - Protonix 40mg  daily

## 2022-11-21 NOTE — Assessment & Plan Note (Signed)
K 3.2 on admission likely in the setting of nausea and vomiting and minimal PO intake.  Repleted and 3.8 this morning. - Monitor with BMP - Replete as necessary

## 2022-11-21 NOTE — Assessment & Plan Note (Signed)
CBG this morning 117 s/p 1 unit aspart yesterday. - CBGs before meals and at bedtime - SSI - Hold home glipizide

## 2022-11-21 NOTE — Plan of Care (Signed)
Patient alert/oriented X4. Patient compliant with medication administration and administered klonopin as needed for anxiety. Patient was up in chair for a few hours. Patient VSS, no complaints at this time.    Problem: Education: Goal: Ability to describe self-care measures that may prevent or decrease complications (Diabetes Survival Skills Education) will improve Outcome: Progressing   Problem: Education: Goal: Individualized Educational Video(s) Outcome: Progressing   Problem: Coping: Goal: Ability to adjust to condition or change in health will improve Outcome: Progressing   Problem: Fluid Volume: Goal: Ability to maintain a balanced intake and output will improve Outcome: Progressing   Problem: Health Behavior/Discharge Planning: Goal: Ability to manage health-related needs will improve Outcome: Progressing   Problem: Metabolic: Goal: Ability to maintain appropriate glucose levels will improve Outcome: Progressing   Problem: Nutritional: Goal: Maintenance of adequate nutrition will improve Outcome: Progressing   Problem: Nutritional: Goal: Progress toward achieving an optimal weight will improve Outcome: Progressing   Problem: Skin Integrity: Goal: Risk for impaired skin integrity will decrease Outcome: Progressing   Problem: Tissue Perfusion: Goal: Adequacy of tissue perfusion will improve Outcome: Progressing   Problem: Education: Goal: Knowledge of General Education information will improve Description: Including pain rating scale, medication(s)/side effects and non-pharmacologic comfort measures Outcome: Progressing   Problem: Health Behavior/Discharge Planning: Goal: Ability to manage health-related needs will improve Outcome: Progressing   Problem: Clinical Measurements: Goal: Diagnostic test results will improve Outcome: Progressing   Problem: Clinical Measurements: Goal: Cardiovascular complication will be avoided Outcome: Progressing   Problem:  Activity: Goal: Risk for activity intolerance will decrease Outcome: Progressing   Problem: Nutrition: Goal: Adequate nutrition will be maintained Outcome: Progressing   Problem: Coping: Goal: Level of anxiety will decrease Outcome: Progressing   Problem: Elimination: Goal: Will not experience complications related to bowel motility Outcome: Progressing   Problem: Elimination: Goal: Will not experience complications related to urinary retention Outcome: Progressing   Problem: Pain Managment: Goal: General experience of comfort will improve Outcome: Progressing   Problem: Safety: Goal: Ability to remain free from injury will improve Outcome: Progressing   Problem: Skin Integrity: Goal: Risk for impaired skin integrity will decrease Outcome: Progressing

## 2022-11-21 NOTE — Assessment & Plan Note (Signed)
CHF (stage 3 or 4 DD, EF 55-60% on 07/2022 echo). BNP 3,209 this morning.  Can consider worsening CHF as cause of SOB with exertion though no pulmonary edema on CXR and has lost weight.  Reassuringly he is breathing comfortably on room air.  Echocardiogram pending. - Cont home Coreg -Cardiology consulted, appreciate recs - f/u echo - Holding Entresto and torsemide given AKI

## 2022-11-21 NOTE — Progress Notes (Signed)
Rounding Note    Patient Name: Isaac Valdez Date of Encounter: 11/21/2022  [facility] Cardiologist: Nanetta Batty, MD   Subjective   Some DOE yesterday however less today  Inpatient Medications    Scheduled Meds:  carvedilol  25 mg Oral BID   diphenhydrAMINE  25 mg Oral BID   enoxaparin (LOVENOX) injection  60 mg Subcutaneous Q24H   feeding supplement  237 mL Oral BID BM   insulin aspart  0-9 Units Subcutaneous TID WC   pantoprazole  40 mg Oral Daily   Continuous Infusions:  PRN Meds: acetaminophen **OR** acetaminophen, clonazePAM, melatonin, ondansetron   Vital Signs    Vitals:   11/20/22 1540 11/20/22 2049 11/21/22 0540 11/21/22 0821  BP: (!) 160/78 126/81 (!) 142/97 (!) 156/94  Pulse: 68 61 (!) 58 (!) 59  Resp: 16   16  Temp: 97.8 F (36.6 C) 98.1 F (36.7 C) 98.2 F (36.8 C) 97.9 F (36.6 C)  TempSrc: Oral Oral Oral Oral  SpO2: 100% 100% 100% 100%  Weight:      Height:        Intake/Output Summary (Last 24 hours) at 11/21/2022 1049 Last data filed at 11/21/2022 0356 Gross per 24 hour  Intake --  Output 340 ml  Net -340 ml      11/19/2022    9:29 PM 11/19/2022   11:04 AM 04/23/2022    7:01 PM  Last 3 Weights  Weight (lbs) 252 lb 13.9 oz 250 lb 259 lb 14.8 oz  Weight (kg) 114.7 kg 113.399 kg 117.9 kg      Telemetry    N/a - Personally Reviewed  ECG     N/a- Personally Reviewed  Physical Exam   GEN: No acute distress.   Neck: No JVD Cardiac: RRR, no murmurs, rubs, or gallops.  Respiratory: Clear to auscultation bilaterally. GI: Soft, nontender, non-distended  MS: No edema; No deformity. Neuro:  Nonfocal  Psych: Normal affect   Labs    High Sensitivity Troponin:   Recent Labs  Lab 11/20/22 1010 11/20/22 1447  TROPONINIHS 26* 23*     Chemistry Recent Labs  Lab 11/19/22 1111 11/19/22 1200 11/20/22 1010 11/20/22 1447 11/21/22 0220  NA 139  --  140  --  138  K 3.2*  --  4.3  --  3.8  CL 108  --  110  --   108  CO2 19*  --  22  --  18*  GLUCOSE 139*  --  112*  --  117*  BUN 29*  --  32*  --  42*  CREATININE 2.25*  --  2.06*  --  2.04*  CALCIUM 8.9  --  9.0  --  8.6*  MG  --  1.6*  --  1.9 1.9  PROT 7.0  --  7.0  --  6.4*  ALBUMIN 3.7  --  3.6  --  3.3*  AST 24  --  25  --  35  ALT 22  --  25  --  32  ALKPHOS 61  --  62  --  67  BILITOT 1.0  --  0.9  --  0.2*  GFRNONAA 33*  --  36*  --  37*  ANIONGAP 12  --  8  --  12    Lipids No results for input(s): "CHOL", "TRIG", "HDL", "LABVLDL", "LDLCALC", "CHOLHDL" in the last 168 hours.  Hematology Recent Labs  Lab 11/19/22 1111 11/20/22 1010 11/21/22 0220  WBC 4.4  4.0 4.8  RBC 3.84* 4.14* 3.84*  HGB 11.6* 12.4* 11.2*  HCT 36.4* 39.4 35.6*  MCV 94.8 95.2 92.7  MCH 30.2 30.0 29.2  MCHC 31.9 31.5 31.5  RDW 17.1* 17.1* 17.2*  PLT 259 263 251   Thyroid  Recent Labs  Lab 11/20/22 1010  TSH 1.021    BNP Recent Labs  Lab 11/21/22 0220  BNP 3,209.1*    DDimer No results for input(s): "DDIMER" in the last 168 hours.   Radiology    US RENAL  Result Date: 11/21/2022 CLINICAL DATA:  Acute kidney injury EXAM: RENAL / URINARY TRACT ULTRASOUND COMPLETE COMPARISON:  CT abdomen pelvis 11/19/2022 FINDINGS: Right Kidney: Renal measurements: 11.2 x 4.6 x 5.2 cm = volume: 141 mL. Echogenicity within normal limits. No mass or hydronephrosis visualized. Left Kidney: Renal measurements: 11.1 x 5.9 x 6.3 cm = volume: 213 mL. Echogenicity within normal limits. No mass or hydronephrosis visualized. Bladder: Appears normal for degree of bladder distention. Other: None. IMPRESSION: No significant sonographic abnormality of the kidneys. Electronically Signed   By: Acquanetta Belling M.D.   On: 11/21/2022 09:33   NM Pulmonary Perfusion  Result Date: 11/20/2022 CLINICAL DATA:  Chronic dyspnea.  Evaluate for pulmonary embolism. EXAM: NUCLEAR MEDICINE PERFUSION LUNG SCAN TECHNIQUE: Perfusion images were obtained in multiple projections after intravenous  injection of radiopharmaceutical. Ventilation scans intentionally deferred if perfusion scan and chest x-ray adequate for interpretation during COVID 19 epidemic. RADIOPHARMACEUTICALS:  4.4 mCi Tc-29m MAA IV COMPARISON:  Chest radiograph from 11/19/2022 . FINDINGS: On the comparison chest radiograph there is no focal pulmonary opacities. The cardiac silhouette is enlarged. No peripheral, segmental perfusion defects to indicate acute pulmonary embolism. Attenuation artifact from enlarged cardiac silhouette noted over the lower left lung. IMPRESSION: No segmental perfusion defects to indicate presence of pulmonary embolism. Electronically Signed   By: Signa Kell M.D.   On: 11/20/2022 15:50   US Abdomen Limited RUQ (LIVER/GB)  Result Date: 11/19/2022 CLINICAL DATA:  Unspecified abdominal pain EXAM: ULTRASOUND ABDOMEN LIMITED RIGHT UPPER QUADRANT COMPARISON:  11/19/2022 FINDINGS: Gallbladder: The gallbladder wall is circumferentially thickened and edematous, similar to that noted on prior CT examination. The gallbladder, however, is not distended no intraluminal stones or sludge is identified and no pericholecystic fluid collections are seen. The sonographic Eulah Pont sign is reportedly negative. Common bile duct: Diameter: 6 mm in proximal diameter Liver: The liver is normal in size. Normal parenchymal echogenicity and echotexture. No focal intrahepatic masses or intrahepatic biliary ductal dilation is seen. Portal vein is patent on color Doppler imaging with normal direction of blood flow towards the liver. Other: None. IMPRESSION: 1. Circumferential gallbladder wall thickening and edema, similar to that noted on prior CT examination. This is nonspecific and can be seen in the setting of biliary or pancreatic inflammation, passive congestion, or hypoproteinemia. Correlation with liver enzymes may be helpful for further evaluation. Electronically Signed   By: Helyn Numbers M.D.   On: 11/19/2022 22:00   CT  ABDOMEN PELVIS WO CONTRAST  Result Date: 11/19/2022 CLINICAL DATA:  Acute abdominal pain for several weeks, history of GERD EXAM: CT ABDOMEN AND PELVIS WITHOUT CONTRAST TECHNIQUE: Multidetector CT imaging of the abdomen and pelvis was performed following the standard protocol without IV contrast. RADIATION DOSE REDUCTION: This exam was performed according to the departmental dose-optimization program which includes automated exposure control, adjustment of the mA and/or kV according to patient size and/or use of iterative reconstruction technique. COMPARISON:  09/27/2016 FINDINGS: Lower chest: No acute pleural  or parenchymal lung disease. Stable cardiomegaly. Hepatobiliary: Unremarkable unenhanced appearance of the liver. The gallbladder is decompressed with nonspecific gallbladder wall thickening. No pericholecystic fat stranding or calcified gallstones. No biliary duct dilation. Pancreas: Unremarkable unenhanced appearance. Spleen: Unremarkable unenhanced appearance. Adrenals/Urinary Tract: No urinary tract calculi or obstructive uropathy within either kidney. The adrenals and bladder are unremarkable. Stomach/Bowel: No bowel obstruction or ileus. Normal appendix right lower quadrant. No bowel wall thickening or inflammatory change. Small hiatal hernia. Vascular/Lymphatic: Aortic atherosclerosis. No enlarged abdominal or pelvic lymph nodes. Reproductive: Prostate is unremarkable. Other: Trace nonspecific free fluid within the bilateral flanks and lower pelvis. No free intraperitoneal gas. No abdominal wall hernia. Musculoskeletal: No acute or destructive bony abnormalities. Postsurgical changes at L4-5 again noted. Reconstructed images demonstrate no additional findings. IMPRESSION: 1. Small hiatal hernia. 2. Mild nonspecific gallbladder wall thickening, likely due to decompressed state. If gallbladder pathology is suspected, right upper quadrant ultrasound could be considered. 3. Minimal nonspecific free fluid  within the lower abdomen and pelvis, unchanged since prior exam. 4. Stable cardiomegaly. 5.  Aortic Atherosclerosis (ICD10-I70.0). Electronically Signed   By: Sharlet Salina M.D.   On: 11/19/2022 17:01   DG Chest Portable 1 View  Result Date: 11/19/2022 CLINICAL DATA:  Evaluate fluid status EXAM: PORTABLE CHEST 1 VIEW COMPARISON:  Chest radiograph 11/13/2022 FINDINGS: The heart is enlarged, unchanged. The upper mediastinal contours are normal There is no definite overt pulmonary edema. There is no focal airspace consolidation. There is no pleural effusion or pneumothorax There is no acute osseous abnormality. IMPRESSION: Cardiomegaly without evidence of pulmonary edema or pleural effusion. Electronically Signed   By: Lesia Hausen M.D.   On: 11/19/2022 16:02    Cardiac Studies     Patient Profile     62 y.o. male with a hx of CHF who is being seen 11/20/2022 for the evaluation of preoperative clearance at the request of Dr Rexene Alberts.   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 Diastolic function not reported however E/A 0.75 would suggest grade I dd - an outside echocardiogram from Jul 29, 2022 showed normal LVEF of 55 to 60%, question of posterior wall hypokinesis, and stage 3-4 diastolic dysfunction was suggested.   - repeat echo pending - CXR no pulm edema, BNP 3209. Difficult to assess volume status by exam due to body habitus  -patient given IVFs on presentation - hold home entresto with elevated Cr, continue coreg.  -trial of IV diuresis today, dose IV lasix 60mg  x 1. Follow renal function closely.    2. Elevated troponin - trop 26-->23 very mild and flat in setting of HF and CKD. - no indication for ischemic testing at this time.   - from outside records cardiac catheterization from August 11, 2022 shows patent coronary arteries with minimal nonobstructive coronary artery disease.  Catheterization was prompted by an  abnormal stress test that showed an LVEF of 31%.  However, an echocardiogram from Jul 29, 2022 showed normal LVEF of 55 to 60%, question of posterior wall hypokinesis, and stage 3-4 diastolic dysfunction was suggested.   2. CKD -admit Cr 2.25, prior labs [facility] early this month 1.27 - follow with diuresis  3. Abdominal pain/ Nausea and vomiting - presenting symptoms - workup per GI, plans for upper and lower endoscopy - ok for endoscopy from cardiac standpoint.    For questions or updates, please contact [facility] Please consult [email or web address] for contact info under  Joanie Coddington, MD  11/21/2022, 10:49 AM

## 2022-11-21 NOTE — Assessment & Plan Note (Signed)
Mg 1.6 on admission, repleted. 1.9 this morning.  - AM Mg, replete as needed

## 2022-11-21 NOTE — Assessment & Plan Note (Addendum)
Cr 2.25 > 2.06> 2.04 (baseline ~1.2-4).  Likely related to heart failure exacerbation/worsening heart failure. Could be prerenal 2/2 N/V or related to starting Entresto. May be new baseline and new diagnosis of CKD.  Considered post renal causes including BPH, postvoid residual of 6 mL this morning. - s/p 1.5L LR in ED - avoid additional fluids  - Avoid NSAIDs and nephrotoxic drugs  - Consider renal ultrasound - urine microalbumin creatinine ratio

## 2022-11-21 NOTE — Progress Notes (Signed)
   11/21/22 2040  BiPAP/CPAP/SIPAP  BiPAP/CPAP/SIPAP Pt Type Adult  BiPAP/CPAP/SIPAP Resmed  Mask Type Full face mask  Mask Size Large  Respiratory Rate 18 breaths/min  PEEP 4 cmH20  FiO2 (%) 21 %  Patient Home Equipment No  Auto Titrate No   Pt states he can place himself on CPAP when ready. RT instructed pt to call if he needs additional assistance tonight.

## 2022-11-21 NOTE — Progress Notes (Signed)
   11/20/22 2158  BiPAP/CPAP/SIPAP  $ Non-Invasive Ventilator  Non-Invasive Vent Subsequent  BiPAP/CPAP/SIPAP Pt Type Adult  BiPAP/CPAP/SIPAP Resmed  Mask Type Full face mask  Mask Size Large  Respiratory Rate 18 breaths/min  PEEP 4 cmH20 (pt only tolerates pressure of 4)  FiO2 (%) 21 %  Patient Home Equipment No  Auto Titrate No

## 2022-11-22 DIAGNOSIS — N179 Acute kidney failure, unspecified: Secondary | ICD-10-CM | POA: Diagnosis not present

## 2022-11-22 DIAGNOSIS — R109 Unspecified abdominal pain: Secondary | ICD-10-CM

## 2022-11-22 DIAGNOSIS — K529 Noninfective gastroenteritis and colitis, unspecified: Secondary | ICD-10-CM | POA: Diagnosis present

## 2022-11-22 DIAGNOSIS — I5021 Acute systolic (congestive) heart failure: Secondary | ICD-10-CM

## 2022-11-22 DIAGNOSIS — I5043 Acute on chronic combined systolic (congestive) and diastolic (congestive) heart failure: Secondary | ICD-10-CM | POA: Diagnosis not present

## 2022-11-22 DIAGNOSIS — R634 Abnormal weight loss: Secondary | ICD-10-CM | POA: Diagnosis present

## 2022-11-22 DIAGNOSIS — I472 Ventricular tachycardia, unspecified: Secondary | ICD-10-CM | POA: Diagnosis not present

## 2022-11-22 DIAGNOSIS — R195 Other fecal abnormalities: Secondary | ICD-10-CM | POA: Diagnosis present

## 2022-11-22 DIAGNOSIS — Z23 Encounter for immunization: Secondary | ICD-10-CM | POA: Diagnosis not present

## 2022-11-22 DIAGNOSIS — I130 Hypertensive heart and chronic kidney disease with heart failure and stage 1 through stage 4 chronic kidney disease, or unspecified chronic kidney disease: Secondary | ICD-10-CM | POA: Diagnosis not present

## 2022-11-22 LAB — COMPREHENSIVE METABOLIC PANEL
ALT: 46 U/L — ABNORMAL HIGH (ref 0–44)
AST: 42 U/L — ABNORMAL HIGH (ref 15–41)
Albumin: 3.4 g/dL — ABNORMAL LOW (ref 3.5–5.0)
Alkaline Phosphatase: 60 U/L (ref 38–126)
Anion gap: 10 (ref 5–15)
BUN: 50 mg/dL — ABNORMAL HIGH (ref 6–20)
CO2: 20 mmol/L — ABNORMAL LOW (ref 22–32)
Calcium: 8.8 mg/dL — ABNORMAL LOW (ref 8.9–10.3)
Chloride: 107 mmol/L (ref 98–111)
Creatinine, Ser: 2.34 mg/dL — ABNORMAL HIGH (ref 0.61–1.24)
GFR, Estimated: 31 mL/min — ABNORMAL LOW (ref >60)
Glucose, Bld: 128 mg/dL — ABNORMAL HIGH (ref 70–99)
Potassium: 3.9 mmol/L (ref 3.5–5.1)
Sodium: 137 mmol/L (ref 135–145)
Total Bilirubin: 0.7 mg/dL (ref 0.3–1.2)
Total Protein: 6.7 g/dL (ref 6.5–8.1)

## 2022-11-22 LAB — CBC
HCT: 37 % — ABNORMAL LOW (ref 39.0–52.0)
Hemoglobin: 11.8 g/dL — ABNORMAL LOW (ref 13.0–17.0)
MCH: 29.4 pg (ref 26.0–34.0)
MCHC: 31.9 g/dL (ref 30.0–36.0)
MCV: 92.3 fL (ref 80.0–100.0)
Platelets: 270 10*3/uL (ref 150–400)
RBC: 4.01 MIL/uL — ABNORMAL LOW (ref 4.22–5.81)
RDW: 17.2 % — ABNORMAL HIGH (ref 11.5–15.5)
WBC: 4.7 10*3/uL (ref 4.0–10.5)
nRBC: 0.4 % — ABNORMAL HIGH (ref 0.0–0.2)

## 2022-11-22 LAB — LACTIC ACID, PLASMA: Lactic Acid, Venous: 1.4 mmol/L (ref 0.5–1.9)

## 2022-11-22 LAB — GLUCOSE, CAPILLARY
Glucose-Capillary: 104 mg/dL — ABNORMAL HIGH (ref 70–99)
Glucose-Capillary: 116 mg/dL — ABNORMAL HIGH (ref 70–99)
Glucose-Capillary: 124 mg/dL — ABNORMAL HIGH (ref 70–99)

## 2022-11-22 MED ORDER — SODIUM CHLORIDE 0.9 % IV SOLN: INTRAVENOUS | Status: DC

## 2022-11-22 MED ORDER — ISOSORBIDE MONONITRATE ER 30 MG PO TB24
30.0000 mg | ORAL_TABLET | Freq: Every day | ORAL | Status: DC
  Administered 2022-11-22 – 2022-11-23 (×2): 30 mg via ORAL
  Filled 2022-11-22 (×2): qty 1

## 2022-11-22 MED ORDER — HYDRALAZINE HCL 25 MG PO TABS
25.0000 mg | ORAL_TABLET | Freq: Three times a day (TID) | ORAL | Status: DC
  Administered 2022-11-22 (×3): 25 mg via ORAL
  Filled 2022-11-22 (×4): qty 1

## 2022-11-22 NOTE — Assessment & Plan Note (Signed)
Pending BMP this morning.  Most recent creatinine 2.04 (baseline  ~1.2-1.4).  Unclear whether this is new baseline, will need to balance this with elevated BNP and worsening heart function.  Renal ultrasound unremarkable. - Avoid additional fluids  - Avoid NSAIDs and nephrotoxic drugs  - urine microalbumin creatinine ratio -Continue to hold [employer]

## 2022-11-22 NOTE — Progress Notes (Signed)
Rounding Note    Patient Name: Isaac Valdez Date of Encounter: 11/22/2022  [facility] Cardiologist: Nanetta Batty, MD   Subjective   Some ongoing dyspena  Inpatient Medications    Scheduled Meds:  carvedilol  25 mg Oral BID   diphenhydrAMINE  25 mg Oral BID   enoxaparin (LOVENOX) injection  60 mg Subcutaneous Q24H   feeding supplement  237 mL Oral BID BM   insulin aspart  0-9 Units Subcutaneous TID WC   pantoprazole  40 mg Oral Daily   Continuous Infusions:  PRN Meds: acetaminophen **OR** acetaminophen, clonazePAM, melatonin, ondansetron, polyethylene glycol, senna   Vital Signs    Vitals:   11/21/22 1540 11/21/22 2115 11/22/22 0514 11/22/22 0713  BP: 134/87 (!) 150/93 (!) 141/85 (!) 157/99  Pulse: 60 61 (!) 57 60  Resp: 16 18 18 16   Temp: 97.9 F (36.6 C) 98.1 F (36.7 C) 98.4 F (36.9 C) 97.7 F (36.5 C)  TempSrc: Oral Oral Oral Oral  SpO2: 100% 100% 100% 100%  Weight:      Height:        Intake/Output Summary (Last 24 hours) at 11/22/2022 0919 Last data filed at 11/21/2022 2130 Gross per 24 hour  Intake --  Output 840 ml  Net -840 ml      11/19/2022    9:29 PM 11/19/2022   11:04 AM 04/23/2022    7:01 PM  Last 3 Weights  Weight (lbs) 252 lb 13.9 oz 250 lb 259 lb 14.8 oz  Weight (kg) 114.7 kg 113.399 kg 117.9 kg      Telemetry    N/a - Personally Reviewed  ECG    N/a - Personally Reviewed  Physical Exam   GEN: No acute distress.   Neck: No JVD Cardiac: RRR, no murmurs, rubs, or gallops.  Respiratory: Clear to auscultation bilaterally. GI: Soft, nontender, non-distended  MS: No edema; No deformity. Neuro:  Nonfocal  Psych: Normal affect   Labs    High Sensitivity Troponin:   Recent Labs  Lab 11/20/22 1010 11/20/22 1447  TROPONINIHS 26* 23*     Chemistry Recent Labs  Lab 11/19/22 1200 11/20/22 1010 11/20/22 1447 11/21/22 0220 11/22/22 0648  NA  --  140  --  138 137  K  --  4.3  --  3.8 3.9  CL  --  110   --  108 107  CO2  --  22  --  18* 20*  GLUCOSE  --  112*  --  117* 128*  BUN  --  32*  --  42* 50*  CREATININE  --  2.06*  --  2.04* 2.34*  CALCIUM  --  9.0  --  8.6* 8.8*  MG 1.6*  --  1.9 1.9  --   PROT  --  7.0  --  6.4* 6.7  ALBUMIN  --  3.6  --  3.3* 3.4*  AST  --  25  --  35 42*  ALT  --  25  --  32 46*  ALKPHOS  --  62  --  67 60  BILITOT  --  0.9  --  0.2* 0.7  GFRNONAA  --  36*  --  37* 31*  ANIONGAP  --  8  --  12 10    Lipids No results for input(s): "CHOL", "TRIG", "HDL", "LABVLDL", "LDLCALC", "CHOLHDL" in the last 168 hours.  Hematology Recent Labs  Lab 11/20/22 1010 11/21/22 0220 11/22/22 0648  WBC 4.0 4.8  4.7  RBC 4.14* 3.84* 4.01*  HGB 12.4* 11.2* 11.8*  HCT 39.4 35.6* 37.0*  MCV 95.2 92.7 92.3  MCH 30.0 29.2 29.4  MCHC 31.5 31.5 31.9  RDW 17.1* 17.2* 17.2*  PLT 263 251 270   Thyroid  Recent Labs  Lab 11/20/22 1010  TSH 1.021    BNP Recent Labs  Lab 11/21/22 0220  BNP 3,209.1*    DDimer No results for input(s): "DDIMER" in the last 168 hours.   Radiology    ECHOCARDIOGRAM COMPLETE  Result Date: 11/21/2022    ECHOCARDIOGRAM REPORT   Patient Name:   Isaac Valdez Date of Exam: 11/21/2022 Medical Rec #:  [id number]        Height:       71.0 in Accession #:    [id number]       Weight:       252.9 lb Date of Birth:  07/26/1960        BSA:          2.330 m Patient Age:    62 years         BP:           156/94 mmHg Patient Gender: M                HR:           55 bpm. Exam Location:  Inpatient Procedure: 2D Echo, Cardiac Doppler and Color Doppler Indications:    CHF  History:        Patient has prior history of Echocardiogram examinations, most                 recent 03/01/2017. CHF, Signs/Symptoms:Dyspnea; Risk                 Factors:Hypertension and Diabetes.  Sonographer:    Darlys Gales Referring Phys: [phone] MICHAEL COOPER IMPRESSIONS  1. Left ventricular ejection fraction, by estimation, is 25 to 30%. The left ventricle has severely decreased  function. The left ventricle demonstrates global hypokinesis. The left ventricular internal cavity size was mildly to moderately dilated. Left ventricular diastolic parameters are indeterminate.  2. Right ventricular systolic function is moderately reduced. The right ventricular size is severely enlarged. There is severely elevated pulmonary artery systolic pressure. The estimated right ventricular systolic pressure is 67.4 mmHg.  3. Left atrial size was severely dilated.  4. The mitral valve is abnormal. Moderate to severe mitral valve regurgitation.  5. Eccentric . Tricuspid valve regurgitation is mild to moderate.  6. The aortic valve is tricuspid. There is mild calcification of the aortic valve. Aortic valve regurgitation is mild. Comparison(s): Prior images reviewed side by side. Notable decrease in function from prior. Discussed with primary cardiology team. FINDINGS  Left Ventricle: Left ventricular ejection fraction, by estimation, is 25 to 30%. The left ventricle has severely decreased function. The left ventricle demonstrates global hypokinesis. The left ventricular internal cavity size was mildly to moderately dilated. There is no left ventricular hypertrophy. Left ventricular diastolic parameters are indeterminate. Right Ventricle: The right ventricular size is severely enlarged. No increase in right ventricular wall thickness. Right ventricular systolic function is moderately reduced. There is severely elevated pulmonary artery systolic pressure. The tricuspid regurgitant velocity is 3.62 m/s, and with an assumed right atrial pressure of 15 mmHg, the estimated right ventricular systolic pressure is 67.4 mmHg. Left Atrium: Left atrial size was severely dilated. Right Atrium: Right atrial size was normal in size. Pericardium: Trivial pericardial effusion is present. The pericardial  effusion is circumferential. Mitral Valve: The mitral valve is abnormal. Moderate to severe mitral valve regurgitation.  Tricuspid Valve: Eccentric. The tricuspid valve is normal in structure. Tricuspid valve regurgitation is mild to moderate. Aortic Valve: The aortic valve is tricuspid. There is mild calcification of the aortic valve. Aortic valve regurgitation is mild. Aortic regurgitation PHT measures 559 msec. Aortic valve mean gradient measures 5.0 mmHg. Aortic valve peak gradient measures 7.5 mmHg. Aortic valve area, by VTI measures 2.19 cm. Pulmonic Valve: The pulmonic valve was not well visualized. Pulmonic valve regurgitation is not visualized. No evidence of pulmonic stenosis. Aorta: The aortic root is normal in size and structure. IAS/Shunts: The atrial septum is grossly normal.  LEFT VENTRICLE PLAX 2D LVIDd:         6.50 cm   Diastology LVIDs:         5.50 cm   LV e' medial:    4.90 cm/s LV PW:         1.00 cm   LV E/e' medial:  25.1 LV IVS:        1.00 cm   LV e' lateral:   6.74 cm/s LVOT diam:     1.80 cm   LV E/e' lateral: 18.2 LV SV:         58 LV SV Index:   25 LVOT Area:     2.54 cm  RIGHT VENTRICLE             IVC RV S prime:     11.00 cm/s  IVC diam: 2.70 cm TAPSE (M-mode): 1.8 cm LEFT ATRIUM              Index        RIGHT ATRIUM           Index LA Vol (A2C):   111.0 ml 47.65 ml/m  RA Area:     18.80 cm LA Vol (A4C):   154.0 ml 66.11 ml/m  RA Volume:   46.60 ml  20.00 ml/m LA Biplane Vol: 137.0 ml 58.81 ml/m  AORTIC VALVE AV Area (Vmax):    2.25 cm AV Area (Vmean):   1.96 cm AV Area (VTI):     2.19 cm AV Vmax:           137.00 cm/s AV Vmean:          109.000 cm/s AV VTI:            0.266 m AV Peak Grad:      7.5 mmHg AV Mean Grad:      5.0 mmHg LVOT Vmax:         121.00 cm/s LVOT Vmean:        83.900 cm/s LVOT VTI:          0.229 m LVOT/AV VTI ratio: 0.86 AI PHT:            559 msec MITRAL VALVE                TRICUSPID VALVE MV Area (PHT): 3.81 cm     TR Peak grad:   52.4 mmHg MV Decel Time: 199 msec     TR Vmax:        362.00 cm/s MV E velocity: 123.00 cm/s                             SHUNTS  Systemic VTI:  0.23 m                             Systemic Diam: 1.80 cm Riley Lam MD Electronically signed by Riley Lam MD Signature Date/Time: 11/21/2022/1:03:56 PM    Final    US RENAL  Result Date: 11/21/2022 CLINICAL DATA:  Acute kidney injury EXAM: RENAL / URINARY TRACT ULTRASOUND COMPLETE COMPARISON:  CT abdomen pelvis 11/19/2022 FINDINGS: Right Kidney: Renal measurements: 11.2 x 4.6 x 5.2 cm = volume: 141 mL. Echogenicity within normal limits. No mass or hydronephrosis visualized. Left Kidney: Renal measurements: 11.1 x 5.9 x 6.3 cm = volume: 213 mL. Echogenicity within normal limits. No mass or hydronephrosis visualized. Bladder: Appears normal for degree of bladder distention. Other: None. IMPRESSION: No significant sonographic abnormality of the kidneys. Electronically Signed   By: Acquanetta Belling M.D.   On: 11/21/2022 09:33   NM Pulmonary Perfusion  Result Date: 11/20/2022 CLINICAL DATA:  Chronic dyspnea.  Evaluate for pulmonary embolism. EXAM: NUCLEAR MEDICINE PERFUSION LUNG SCAN TECHNIQUE: Perfusion images were obtained in multiple projections after intravenous injection of radiopharmaceutical. Ventilation scans intentionally deferred if perfusion scan and chest x-ray adequate for interpretation during COVID 19 epidemic. RADIOPHARMACEUTICALS:  4.4 mCi Tc-16m MAA IV COMPARISON:  Chest radiograph from 11/19/2022 . FINDINGS: On the comparison chest radiograph there is no focal pulmonary opacities. The cardiac silhouette is enlarged. No peripheral, segmental perfusion defects to indicate acute pulmonary embolism. Attenuation artifact from enlarged cardiac silhouette noted over the lower left lung. IMPRESSION: No segmental perfusion defects to indicate presence of pulmonary embolism. Electronically Signed   By: Signa Kell M.D.   On: 11/20/2022 15:50    Cardiac Studies     Patient Profile     62 y.o. male with a hx of CHF who is being seen 11/20/2022 for  the evaluation of preoperative clearance at the request of Dr Rexene Alberts.   Assessment & Plan    1.Acute HFrEF -echo 09/2017 at [facility] LVEF 45-50%, mild aortic stenosis. Diastolic function not reported however E/A 0.75 would suggest grade I dd - 07/2022 echo [facility]: LVEF 55-60%, grade III/IV dd - 08/2022 cath [facility] very mild nonobstructive disease  11/2022 echo: LVEF 25-30%, global hypokinesis, mod RV dysfunction, RVE severe enlargement, PASP 67, severe LAE, mod to severe MR - CXR no pulm edema, BNP 3209. Difficult to assess volume status by exam due to body habitus  -patient given IVFs on presentation with N/V   - reecived IV lasix 60mg  x 1 yesterday. Incomplete I/Os. Will order daily standing weights. Uptrend in Cr 2 to 2.3. Midly elevated liver enzymes, he is hypretensive. Hold on diuretic today - difficult to know where we are volume wise. Uptrend in Cr with attempt in diuresis, unclear if dry or low output from LV/RV dysfunction. Exam limited by body habitus. BNP was 3000 and ongoing dyspnea, abdominal distension.  Would plan for RHC to eval filling pressures and CI to help guide next steps. Check a lactic acid.  - cath 08/2022 was benign. May consider cMRI. Unclear what dropped is LVEF since 07/2022 at this point.   - medical therapy limited due to renal disease. Sherryll Burger has been stopped - currently on coreg 25mg  bid. Given drop in LVEF and HTN/high afterload will add hydralazine 25mg  tid and imdur 30mg .    Informed Consent   Shared Decision Making/Informed Consent The risks [stroke (1 in 1000), death (1 in 1000), kidney failure [facility]  temporary] (1 in 500), bleeding (1 in 200), allergic reaction [possibly serious] (1 in 200)], benefits (diagnostic support and management of coronary artery disease) and alternatives of a cardiac catheterization were discussed in detail with Mr. Ramey and he is willing to proceed.         2. Elevated troponin - trop 26-->23 very mild and flat in  setting of HF and CKD. - no indication for ischemic testing at this time.  -08/2022 cath no significant CAD   3. CKD -admit Cr 2.25, prior labs atrium early this month 1.27 - follow with diuresis   4. Abdominal pain/ Nausea and vomiting - presenting symptoms - workup per GI, plans for upper and lower endoscopy - given drop in LVEF with plans for RHC tomorrow would hold on GI procedures   For questions or updates, please contact [facility] Please consult [email or web address] for contact info under        Signed, Dina Rich, MD  11/22/2022, 9:19 AM

## 2022-11-22 NOTE — Assessment & Plan Note (Signed)
Well-controlled.  Has not required significant insulin dosing. - CBGs before meals and at bedtime - SSI - Hold home glipizide

## 2022-11-22 NOTE — H&P (View-Only) (Signed)
Rounding Note    Patient Name: Isaac Valdez Date of Encounter: 11/22/2022  [facility] Cardiologist: Nanetta Batty, MD   Subjective   Some ongoing dyspena  Inpatient Medications    Scheduled Meds:  carvedilol  25 mg Oral BID   diphenhydrAMINE  25 mg Oral BID   enoxaparin (LOVENOX) injection  60 mg Subcutaneous Q24H   feeding supplement  237 mL Oral BID BM   insulin aspart  0-9 Units Subcutaneous TID WC   pantoprazole  40 mg Oral Daily   Continuous Infusions:  PRN Meds: acetaminophen **OR** acetaminophen, clonazePAM, melatonin, ondansetron, polyethylene glycol, senna   Vital Signs    Vitals:   11/21/22 1540 11/21/22 2115 11/22/22 0514 11/22/22 0713  BP: 134/87 (!) 150/93 (!) 141/85 (!) 157/99  Pulse: 60 61 (!) 57 60  Resp: 16 18 18 16   Temp: 97.9 F (36.6 C) 98.1 F (36.7 C) 98.4 F (36.9 C) 97.7 F (36.5 C)  TempSrc: Oral Oral Oral Oral  SpO2: 100% 100% 100% 100%  Weight:      Height:        Intake/Output Summary (Last 24 hours) at 11/22/2022 0919 Last data filed at 11/21/2022 2130 Gross per 24 hour  Intake --  Output 840 ml  Net -840 ml      11/19/2022    9:29 PM 11/19/2022   11:04 AM 04/23/2022    7:01 PM  Last 3 Weights  Weight (lbs) 252 lb 13.9 oz 250 lb 259 lb 14.8 oz  Weight (kg) 114.7 kg 113.399 kg 117.9 kg      Telemetry    N/a - Personally Reviewed  ECG    N/a - Personally Reviewed  Physical Exam   GEN: No acute distress.   Neck: No JVD Cardiac: RRR, no murmurs, rubs, or gallops.  Respiratory: Clear to auscultation bilaterally. GI: Soft, nontender, non-distended  MS: No edema; No deformity. Neuro:  Nonfocal  Psych: Normal affect   Labs    High Sensitivity Troponin:   Recent Labs  Lab 11/20/22 1010 11/20/22 1447  TROPONINIHS 26* 23*     Chemistry Recent Labs  Lab 11/19/22 1200 11/20/22 1010 11/20/22 1447 11/21/22 0220 11/22/22 0648  NA  --  140  --  138 137  K  --  4.3  --  3.8 3.9  CL  --  110   --  108 107  CO2  --  22  --  18* 20*  GLUCOSE  --  112*  --  117* 128*  BUN  --  32*  --  42* 50*  CREATININE  --  2.06*  --  2.04* 2.34*  CALCIUM  --  9.0  --  8.6* 8.8*  MG 1.6*  --  1.9 1.9  --   PROT  --  7.0  --  6.4* 6.7  ALBUMIN  --  3.6  --  3.3* 3.4*  AST  --  25  --  35 42*  ALT  --  25  --  32 46*  ALKPHOS  --  62  --  67 60  BILITOT  --  0.9  --  0.2* 0.7  GFRNONAA  --  36*  --  37* 31*  ANIONGAP  --  8  --  12 10    Lipids No results for input(s): "CHOL", "TRIG", "HDL", "LABVLDL", "LDLCALC", "CHOLHDL" in the last 168 hours.  Hematology Recent Labs  Lab 11/20/22 1010 11/21/22 0220 11/22/22 0648  WBC 4.0 4.8  4.7  RBC 4.14* 3.84* 4.01*  HGB 12.4* 11.2* 11.8*  HCT 39.4 35.6* 37.0*  MCV 95.2 92.7 92.3  MCH 30.0 29.2 29.4  MCHC 31.5 31.5 31.9  RDW 17.1* 17.2* 17.2*  PLT 263 251 270   Thyroid  Recent Labs  Lab 11/20/22 1010  TSH 1.021    BNP Recent Labs  Lab 11/21/22 0220  BNP 3,209.1*    DDimer No results for input(s): "DDIMER" in the last 168 hours.   Radiology    ECHOCARDIOGRAM COMPLETE  Result Date: 11/21/2022    ECHOCARDIOGRAM REPORT   Patient Name:   CYLE HAFLEY Date of Exam: 11/21/2022 Medical Rec #:  [id number]        Height:       71.0 in Accession #:    [id number]       Weight:       252.9 lb Date of Birth:  07/26/1960        BSA:          2.330 m Patient Age:    62 years         BP:           156/94 mmHg Patient Gender: M                HR:           55 bpm. Exam Location:  Inpatient Procedure: 2D Echo, Cardiac Doppler and Color Doppler Indications:    CHF  History:        Patient has prior history of Echocardiogram examinations, most                 recent 03/01/2017. CHF, Signs/Symptoms:Dyspnea; Risk                 Factors:Hypertension and Diabetes.  Sonographer:    Darlys Gales Referring Phys: [phone] MICHAEL COOPER IMPRESSIONS  1. Left ventricular ejection fraction, by estimation, is 25 to 30%. The left ventricle has severely decreased  function. The left ventricle demonstrates global hypokinesis. The left ventricular internal cavity size was mildly to moderately dilated. Left ventricular diastolic parameters are indeterminate.  2. Right ventricular systolic function is moderately reduced. The right ventricular size is severely enlarged. There is severely elevated pulmonary artery systolic pressure. The estimated right ventricular systolic pressure is 67.4 mmHg.  3. Left atrial size was severely dilated.  4. The mitral valve is abnormal. Moderate to severe mitral valve regurgitation.  5. Eccentric . Tricuspid valve regurgitation is mild to moderate.  6. The aortic valve is tricuspid. There is mild calcification of the aortic valve. Aortic valve regurgitation is mild. Comparison(s): Prior images reviewed side by side. Notable decrease in function from prior. Discussed with primary cardiology team. FINDINGS  Left Ventricle: Left ventricular ejection fraction, by estimation, is 25 to 30%. The left ventricle has severely decreased function. The left ventricle demonstrates global hypokinesis. The left ventricular internal cavity size was mildly to moderately dilated. There is no left ventricular hypertrophy. Left ventricular diastolic parameters are indeterminate. Right Ventricle: The right ventricular size is severely enlarged. No increase in right ventricular wall thickness. Right ventricular systolic function is moderately reduced. There is severely elevated pulmonary artery systolic pressure. The tricuspid regurgitant velocity is 3.62 m/s, and with an assumed right atrial pressure of 15 mmHg, the estimated right ventricular systolic pressure is 67.4 mmHg. Left Atrium: Left atrial size was severely dilated. Right Atrium: Right atrial size was normal in size. Pericardium: Trivial pericardial effusion is present. The pericardial  effusion is circumferential. Mitral Valve: The mitral valve is abnormal. Moderate to severe mitral valve regurgitation.  Tricuspid Valve: Eccentric. The tricuspid valve is normal in structure. Tricuspid valve regurgitation is mild to moderate. Aortic Valve: The aortic valve is tricuspid. There is mild calcification of the aortic valve. Aortic valve regurgitation is mild. Aortic regurgitation PHT measures 559 msec. Aortic valve mean gradient measures 5.0 mmHg. Aortic valve peak gradient measures 7.5 mmHg. Aortic valve area, by VTI measures 2.19 cm. Pulmonic Valve: The pulmonic valve was not well visualized. Pulmonic valve regurgitation is not visualized. No evidence of pulmonic stenosis. Aorta: The aortic root is normal in size and structure. IAS/Shunts: The atrial septum is grossly normal.  LEFT VENTRICLE PLAX 2D LVIDd:         6.50 cm   Diastology LVIDs:         5.50 cm   LV e' medial:    4.90 cm/s LV PW:         1.00 cm   LV E/e' medial:  25.1 LV IVS:        1.00 cm   LV e' lateral:   6.74 cm/s LVOT diam:     1.80 cm   LV E/e' lateral: 18.2 LV SV:         58 LV SV Index:   25 LVOT Area:     2.54 cm  RIGHT VENTRICLE             IVC RV S prime:     11.00 cm/s  IVC diam: 2.70 cm TAPSE (M-mode): 1.8 cm LEFT ATRIUM              Index        RIGHT ATRIUM           Index LA Vol (A2C):   111.0 ml 47.65 ml/m  RA Area:     18.80 cm LA Vol (A4C):   154.0 ml 66.11 ml/m  RA Volume:   46.60 ml  20.00 ml/m LA Biplane Vol: 137.0 ml 58.81 ml/m  AORTIC VALVE AV Area (Vmax):    2.25 cm AV Area (Vmean):   1.96 cm AV Area (VTI):     2.19 cm AV Vmax:           137.00 cm/s AV Vmean:          109.000 cm/s AV VTI:            0.266 m AV Peak Grad:      7.5 mmHg AV Mean Grad:      5.0 mmHg LVOT Vmax:         121.00 cm/s LVOT Vmean:        83.900 cm/s LVOT VTI:          0.229 m LVOT/AV VTI ratio: 0.86 AI PHT:            559 msec MITRAL VALVE                TRICUSPID VALVE MV Area (PHT): 3.81 cm     TR Peak grad:   52.4 mmHg MV Decel Time: 199 msec     TR Vmax:        362.00 cm/s MV E velocity: 123.00 cm/s                             SHUNTS  Systemic VTI:  0.23 m                             Systemic Diam: 1.80 cm Riley Lam MD Electronically signed by Riley Lam MD Signature Date/Time: 11/21/2022/1:03:56 PM    Final    US RENAL  Result Date: 11/21/2022 CLINICAL DATA:  Acute kidney injury EXAM: RENAL / URINARY TRACT ULTRASOUND COMPLETE COMPARISON:  CT abdomen pelvis 11/19/2022 FINDINGS: Right Kidney: Renal measurements: 11.2 x 4.6 x 5.2 cm = volume: 141 mL. Echogenicity within normal limits. No mass or hydronephrosis visualized. Left Kidney: Renal measurements: 11.1 x 5.9 x 6.3 cm = volume: 213 mL. Echogenicity within normal limits. No mass or hydronephrosis visualized. Bladder: Appears normal for degree of bladder distention. Other: None. IMPRESSION: No significant sonographic abnormality of the kidneys. Electronically Signed   By: Acquanetta Belling M.D.   On: 11/21/2022 09:33   NM Pulmonary Perfusion  Result Date: 11/20/2022 CLINICAL DATA:  Chronic dyspnea.  Evaluate for pulmonary embolism. EXAM: NUCLEAR MEDICINE PERFUSION LUNG SCAN TECHNIQUE: Perfusion images were obtained in multiple projections after intravenous injection of radiopharmaceutical. Ventilation scans intentionally deferred if perfusion scan and chest x-ray adequate for interpretation during COVID 19 epidemic. RADIOPHARMACEUTICALS:  4.4 mCi Tc-16m MAA IV COMPARISON:  Chest radiograph from 11/19/2022 . FINDINGS: On the comparison chest radiograph there is no focal pulmonary opacities. The cardiac silhouette is enlarged. No peripheral, segmental perfusion defects to indicate acute pulmonary embolism. Attenuation artifact from enlarged cardiac silhouette noted over the lower left lung. IMPRESSION: No segmental perfusion defects to indicate presence of pulmonary embolism. Electronically Signed   By: Signa Kell M.D.   On: 11/20/2022 15:50    Cardiac Studies     Patient Profile     62 y.o. male with a hx of CHF who is being seen 11/20/2022 for  the evaluation of preoperative clearance at the request of Dr Rexene Alberts.   Assessment & Plan    1.Acute HFrEF -echo 09/2017 at [facility] LVEF 45-50%, mild aortic stenosis. Diastolic function not reported however E/A 0.75 would suggest grade I dd - 07/2022 echo [facility]: LVEF 55-60%, grade III/IV dd - 08/2022 cath [facility] very mild nonobstructive disease  11/2022 echo: LVEF 25-30%, global hypokinesis, mod RV dysfunction, RVE severe enlargement, PASP 67, severe LAE, mod to severe MR - CXR no pulm edema, BNP 3209. Difficult to assess volume status by exam due to body habitus  -patient given IVFs on presentation with N/V   - reecived IV lasix 60mg  x 1 yesterday. Incomplete I/Os. Will order daily standing weights. Uptrend in Cr 2 to 2.3. Midly elevated liver enzymes, he is hypretensive. Hold on diuretic today - difficult to know where we are volume wise. Uptrend in Cr with attempt in diuresis, unclear if dry or low output from LV/RV dysfunction. Exam limited by body habitus. BNP was 3000 and ongoing dyspnea, abdominal distension.  Would plan for RHC to eval filling pressures and CI to help guide next steps. Check a lactic acid.  - cath 08/2022 was benign. May consider cMRI. Unclear what dropped is LVEF since 07/2022 at this point.   - medical therapy limited due to renal disease. Sherryll Burger has been stopped - currently on coreg 25mg  bid. Given drop in LVEF and HTN/high afterload will add hydralazine 25mg  tid and imdur 30mg .    Informed Consent   Shared Decision Making/Informed Consent The risks [stroke (1 in 1000), death (1 in 1000), kidney failure [facility]  temporary] (1 in 500), bleeding (1 in 200), allergic reaction [possibly serious] (1 in 200)], benefits (diagnostic support and management of coronary artery disease) and alternatives of a cardiac catheterization were discussed in detail with Mr. Ramey and he is willing to proceed.         2. Elevated troponin - trop 26-->23 very mild and flat in  setting of HF and CKD. - no indication for ischemic testing at this time.  -08/2022 cath no significant CAD   3. CKD -admit Cr 2.25, prior labs atrium early this month 1.27 - follow with diuresis   4. Abdominal pain/ Nausea and vomiting - presenting symptoms - workup per GI, plans for upper and lower endoscopy - given drop in LVEF with plans for RHC tomorrow would hold on GI procedures   For questions or updates, please contact [facility] Please consult [email or web address] for contact info under        Signed, Dina Rich, MD  11/22/2022, 9:19 AM

## 2022-11-22 NOTE — Assessment & Plan Note (Signed)
Abdominal pain is improved although not resolved.  Cardiology is cleared for scopes.  RUQ [place] and CT A/P with nonspecific gallbladder wall thickening. -GI following, appreciate recs: Awaiting recommendations for timing of EGD and colonoscopy - Tylenol prn for pain - Zofran prn for N/V - Protonix 40mg  daily

## 2022-11-22 NOTE — Plan of Care (Signed)

## 2022-11-22 NOTE — Assessment & Plan Note (Signed)
CHF (stage 3 or 4 DD, EF 55-60% on 07/2022 echo). BNP 3,209 this morning.  Echo reflects worsened LVEF of 25 to 30% with global hypokinesis and severely dilated LA. - Cardiology following, appreciate recommendations - Cont home Coreg - Diurese further pending renal function and cardiology recommendations

## 2022-11-22 NOTE — Plan of Care (Signed)
Problem: Coping: Goal: Ability to adjust to condition or change in health will improve Outcome: Progressing   Problem: Activity: Goal: Risk for activity intolerance will decrease Outcome: Progressing

## 2022-11-22 NOTE — Progress Notes (Signed)
Daily Progress Note Intern Pager: [phone]  Patient name: Isaac Valdez Medical record number: [id number] Date of birth: Jul 24, 1960 Age: 62 y.o. Gender: male  Primary Care Provider: Jerrell Mylar, PA-C Consultants: Cardiology, GI Code Status: Full  Pt Overview and Major Events to Date:  9/12: Admitted  Assessment and Plan: Brenten Huitron is a 62 y.o. male who presented with generalized abdominal pain, n/v/d and AKI who is receiving continuous workup and consideration for scoping after being cleared by cardiology. Pertinent PMH/PSH includes CHF, DM, GERD, hypertension.  Assessment & Plan AKI (acute kidney injury) (HCC) Pending BMP this morning.  Most recent creatinine 2.04 (baseline  ~1.2-1.4).  Unclear whether this is new baseline, will need to balance this with elevated BNP and worsening heart function.  Renal ultrasound unremarkable. - Avoid additional fluids  - Avoid NSAIDs and nephrotoxic drugs  - urine microalbumin creatinine ratio -Continue to hold Entresto Abdominal pain Abdominal pain is improved although not resolved.  Cardiology is cleared for scopes.  RUQ [place] and CT A/P with nonspecific gallbladder wall thickening. -GI following, appreciate recs: Awaiting recommendations for timing of EGD and colonoscopy - Tylenol prn for pain - Zofran prn for N/V - Protonix 40mg  daily Chronic diastolic congestive heart failure (HCC) CHF (stage 3 or 4 DD, EF 55-60% on 07/2022 echo). BNP 3,209 this morning.  Echo reflects worsened LVEF of 25 to 30% with global hypokinesis and severely dilated LA. - Cardiology following, appreciate recommendations - Cont home Coreg - Diurese further pending renal function and cardiology recommendations Hypokalemia Pending BMP. Replace as necessary. - Monitor with BMP - Replete as necessary Type 2 diabetes mellitus with other specified complication (HCC) Well-controlled.  Has not required significant insulin dosing. - CBGs before meals  and at bedtime - SSI - Hold home glipizide  Chronic and Stable Problems:  HTN  Aflutter: On Coreg 25 mg, not anticoagulated due to low chadvasc score HLD: On Lipitor  FEN/GI: Regular PPx: Lovenox Dispo:Home  continue medical management, scoping .   Subjective:  Patient states he is doing well this morning, his abdominal pain has improved since getting meds last night.  He is awaiting information about doing bowel prep.  Objective: Temp:  [97.9 F (36.6 C)-98.4 F (36.9 C)] 98.4 F (36.9 C) (09/15 0514) Pulse Rate:  [57-61] 57 (09/15 0514) Resp:  [16-18] 18 (09/15 0514) BP: (134-156)/(85-94) 141/85 (09/15 0514) SpO2:  [100 %] 100 % (09/15 0514) FiO2 (%):  [21 %] 21 % (09/14 2040) Physical Exam: General: Well-appearing, NAD Cardiovascular: RRR, no murmurs auscultated Respiratory: CTAB, normal WOB on room air Abdomen: Soft, nontender, distended abdomen, normoactive bowel sounds  Laboratory: Most recent CBC Lab Results  Component Value Date   WBC 4.8 11/21/2022   HGB 11.2 (L) 11/21/2022   HCT 35.6 (L) 11/21/2022   MCV 92.7 11/21/2022   PLT 251 11/21/2022   Most recent BMP    Latest Ref Rng & Units 11/21/2022    2:20 AM  BMP  Glucose 70 - 99 mg/dL 324   BUN 6 - 20 mg/dL 42   Creatinine 4.01 - 1.24 mg/dL 0.27   Sodium 253 - 664 mmol/L 138   Potassium 3.5 - 5.1 mmol/L 3.8   Chloride 98 - 111 mmol/L 108   CO2 22 - 32 mmol/L 18   Calcium 8.9 - 10.3 mg/dL 8.6    Imaging/Diagnostic Tests: VQ scan: negative  Renal U/S: wnl  Echo: LVEF 25-30%, global hypokinesis, LA severely dilated, mod-sev MV regurg  Shelby Mattocks, DO 11/22/2022, 6:18 AM  PGY-3, [facility] FPTS Intern pager: [phone], text pages welcome Secure chat group [facility] Teaching Service

## 2022-11-22 NOTE — Progress Notes (Signed)
Progress Note  Primary GI: Unassigned DOA: 11/19/2022         Hospital Day: 4   Subjective  Chief Complaint:  Nausea, vomiting, abd pain, weight loss     No family was present at the time of my evaluation. Patient states he continues to have some shortness of breath and some chest discomfort, was evaluated by cardiology and they are planning on doing right heart catheterization tomorrow.  From a GI standpoint he continues to have abdominal discomfort worse with moving around and worse after eating.  No further nausea and vomiting.    Objective   Vital signs in last 24 hours: Temp:  [97.7 F (36.5 C)-98.4 F (36.9 C)] 97.7 F (36.5 C) (09/15 0713) Pulse Rate:  [57-61] 60 (09/15 0713) Resp:  [16-18] 16 (09/15 0713) BP: (134-157)/(85-99) 157/99 (09/15 0713) SpO2:  [100 %] 100 % (09/15 0713) FiO2 (%):  [21 %] 21 % (09/14 2040) Last BM Date : 11/20/22 Last BM recorded by nurses in past 5 days No data recorded  General:   male in no acute distress  Heart:  Regular rate and rhythm; holosystolic murmur Pulm: Clear anteriorly; no wheezing Abdomen:  Soft, Obese AB, Active bowel sounds. No tenderness  Extremities:  without  edema. Neurologic:  Alert and  oriented x4;  No focal deficits.  Psych:  Cooperative. Normal mood and affect.  Intake/Output from previous day: 09/14 0701 - 09/15 0700 In: -  Out: 840 [Urine:840] Intake/Output this shift: No intake/output data recorded.  Studies/Results: ECHOCARDIOGRAM COMPLETE  Result Date: 11/21/2022    ECHOCARDIOGRAM REPORT   Patient Name:   TORRIAN WETZ Date of Exam: 11/21/2022 Medical Rec #:  [id number]        Height:       71.0 in Accession #:    [id number]       Weight:       252.9 lb Date of Birth:  1960/05/29        BSA:          2.330 m Patient Age:    62 years         BP:           156/94 mmHg Patient Gender: M                HR:           55 bpm. Exam Location:  Inpatient Procedure: 2D Echo, Cardiac Doppler and Color Doppler  Indications:    CHF  History:        Patient has prior history of Echocardiogram examinations, most                 recent 03/01/2017. CHF, Signs/Symptoms:Dyspnea; Risk                 Factors:Hypertension and Diabetes.  Sonographer:    Darlys Gales Referring Phys: [phone] MICHAEL COOPER IMPRESSIONS  1. Left ventricular ejection fraction, by estimation, is 25 to 30%. The left ventricle has severely decreased function. The left ventricle demonstrates global hypokinesis. The left ventricular internal cavity size was mildly to moderately dilated. Left ventricular diastolic parameters are indeterminate.  2. Right ventricular systolic function is moderately reduced. The right ventricular size is severely enlarged. There is severely elevated pulmonary artery systolic pressure. The estimated right ventricular systolic pressure is 67.4 mmHg.  3. Left atrial size was severely dilated.  4. The mitral valve is abnormal. Moderate to severe mitral valve regurgitation.  5. Eccentric . Tricuspid  valve regurgitation is mild to moderate.  6. The aortic valve is tricuspid. There is mild calcification of the aortic valve. Aortic valve regurgitation is mild. Comparison(s): Prior images reviewed side by side. Notable decrease in function from prior. Discussed with primary cardiology team. FINDINGS  Left Ventricle: Left ventricular ejection fraction, by estimation, is 25 to 30%. The left ventricle has severely decreased function. The left ventricle demonstrates global hypokinesis. The left ventricular internal cavity size was mildly to moderately dilated. There is no left ventricular hypertrophy. Left ventricular diastolic parameters are indeterminate. Right Ventricle: The right ventricular size is severely enlarged. No increase in right ventricular wall thickness. Right ventricular systolic function is moderately reduced. There is severely elevated pulmonary artery systolic pressure. The tricuspid regurgitant velocity is 3.62 m/s, and with  an assumed right atrial pressure of 15 mmHg, the estimated right ventricular systolic pressure is 67.4 mmHg. Left [facility]: Left atrial size was severely dilated. Right [facility]: Right atrial size was normal in size. Pericardium: Trivial pericardial effusion is present. The pericardial effusion is circumferential. Mitral Valve: The mitral valve is abnormal. Moderate to severe mitral valve regurgitation. Tricuspid Valve: Eccentric. The tricuspid valve is normal in structure. Tricuspid valve regurgitation is mild to moderate. Aortic Valve: The aortic valve is tricuspid. There is mild calcification of the aortic valve. Aortic valve regurgitation is mild. Aortic regurgitation PHT measures 559 msec. Aortic valve mean gradient measures 5.0 mmHg. Aortic valve peak gradient measures 7.5 mmHg. Aortic valve area, by VTI measures 2.19 cm. Pulmonic Valve: The pulmonic valve was not well visualized. Pulmonic valve regurgitation is not visualized. No evidence of pulmonic stenosis. Aorta: The aortic root is normal in size and structure. IAS/Shunts: The atrial septum is grossly normal.  LEFT VENTRICLE PLAX 2D LVIDd:         6.50 cm   Diastology LVIDs:         5.50 cm   LV e' medial:    4.90 cm/s LV PW:         1.00 cm   LV E/e' medial:  25.1 LV IVS:        1.00 cm   LV e' lateral:   6.74 cm/s LVOT diam:     1.80 cm   LV E/e' lateral: 18.2 LV SV:         58 LV SV Index:   25 LVOT Area:     2.54 cm  RIGHT VENTRICLE             IVC RV S prime:     11.00 cm/s  IVC diam: 2.70 cm TAPSE (M-mode): 1.8 cm LEFT ATRIUM              Index        RIGHT ATRIUM           Index LA Vol (A2C):   111.0 ml 47.65 ml/m  RA Area:     18.80 cm LA Vol (A4C):   154.0 ml 66.11 ml/m  RA Volume:   46.60 ml  20.00 ml/m LA Biplane Vol: 137.0 ml 58.81 ml/m  AORTIC VALVE AV Area (Vmax):    2.25 cm AV Area (Vmean):   1.96 cm AV Area (VTI):     2.19 cm AV Vmax:           137.00 cm/s AV Vmean:          109.000 cm/s AV VTI:            0.266 m AV Peak Grad:  7.5 mmHg AV Mean Grad:      5.0 mmHg LVOT Vmax:         121.00 cm/s LVOT Vmean:        83.900 cm/s LVOT VTI:          0.229 m LVOT/AV VTI ratio: 0.86 AI PHT:            559 msec MITRAL VALVE                TRICUSPID VALVE MV Area (PHT): 3.81 cm     TR Peak grad:   52.4 mmHg MV Decel Time: 199 msec     TR Vmax:        362.00 cm/s MV E velocity: 123.00 cm/s                             SHUNTS                             Systemic VTI:  0.23 m                             Systemic Diam: 1.80 cm Riley Lam MD Electronically signed by Riley Lam MD Signature Date/Time: 11/21/2022/1:03:56 PM    Final    US RENAL  Result Date: 11/21/2022 CLINICAL DATA:  Acute kidney injury EXAM: RENAL / URINARY TRACT ULTRASOUND COMPLETE COMPARISON:  CT abdomen pelvis 11/19/2022 FINDINGS: Right Kidney: Renal measurements: 11.2 x 4.6 x 5.2 cm = volume: 141 mL. Echogenicity within normal limits. No mass or hydronephrosis visualized. Left Kidney: Renal measurements: 11.1 x 5.9 x 6.3 cm = volume: 213 mL. Echogenicity within normal limits. No mass or hydronephrosis visualized. Bladder: Appears normal for degree of bladder distention. Other: None. IMPRESSION: No significant sonographic abnormality of the kidneys. Electronically Signed   By: Acquanetta Belling M.D.   On: 11/21/2022 09:33   NM Pulmonary Perfusion  Result Date: 11/20/2022 CLINICAL DATA:  Chronic dyspnea.  Evaluate for pulmonary embolism. EXAM: NUCLEAR MEDICINE PERFUSION LUNG SCAN TECHNIQUE: Perfusion images were obtained in multiple projections after intravenous injection of radiopharmaceutical. Ventilation scans intentionally deferred if perfusion scan and chest x-ray adequate for interpretation during COVID 19 epidemic. RADIOPHARMACEUTICALS:  4.4 mCi Tc-67m MAA IV COMPARISON:  Chest radiograph from 11/19/2022 . FINDINGS: On the comparison chest radiograph there is no focal pulmonary opacities. The cardiac silhouette is enlarged. No peripheral, segmental  perfusion defects to indicate acute pulmonary embolism. Attenuation artifact from enlarged cardiac silhouette noted over the lower left lung. IMPRESSION: No segmental perfusion defects to indicate presence of pulmonary embolism. Electronically Signed   By: Signa Kell M.D.   On: 11/20/2022 15:50    Lab Results: Recent Labs    11/20/22 1010 11/21/22 0220 11/22/22 0648  WBC 4.0 4.8 4.7  HGB 12.4* 11.2* 11.8*  HCT 39.4 35.6* 37.0*  PLT 263 251 270   BMET Recent Labs    11/20/22 1010 11/21/22 0220 11/22/22 0648  NA 140 138 137  K 4.3 3.8 3.9  CL 110 108 107  CO2 22 18* 20*  GLUCOSE 112* 117* 128*  BUN 32* 42* 50*  CREATININE 2.06* 2.04* 2.34*  CALCIUM 9.0 8.6* 8.8*   LFT Recent Labs    11/22/22 0648  PROT 6.7  ALBUMIN 3.4*  AST 42*  ALT 46*  ALKPHOS 60  BILITOT 0.7   PT/INR  No results for input(s): "LABPROT", "INR" in the last 72 hours.   Scheduled Meds:  carvedilol  25 mg Oral BID   diphenhydrAMINE  25 mg Oral BID   enoxaparin (LOVENOX) injection  60 mg Subcutaneous Q24H   feeding supplement  237 mL Oral BID BM   hydrALAZINE  25 mg Oral TID   insulin aspart  0-9 Units Subcutaneous TID WC   isosorbide mononitrate  30 mg Oral Daily   pantoprazole  40 mg Oral Daily   Continuous Infusions:      Impression/Plan:   Nausea,vomiting, epigastric pain with weight loss  CT shows the gallbladder is decompressed with nonspecific gallbladder wall thickening. No pericholecystic fat stranding or calcified gallstones. No biliary duct dilation. No bowel obstruction or ileus. No bowel wall thickening or inflammatory change. Small H/H.  Abd u/s shows circumferential gallbladder wall thickening and edema. No stones or sludge. Normal LFT's. Lipase normal. VQ scan negative. -Continue Pantoprazole 40 mg po -Antiemetics as needed -Patient evaluated by cardiology this morning and with repeat echocardiogram showing ejection fraction 25 to 30% with global hypokinesis severe right  ventricular enlargement, RSVP 67 moderate to severe MR cardiology is planning right heart catheterization tomorrow morning prior to GI procedures. -Will have our inpatient team follow-up tomorrow after [facility] to determine eligibility for endoscopy colonoscopy inpatient.  Diarrhea Last colonoscopy 5 years ago with [facility] Dr. Noe Gens with findings of polyps. - last BM this morning and harder stool no hematochezia -Possible overflow, consider MiraLAX if any worsening constipation.   Anemia, chronic 11/22/2022  HGB 11.8 MCV 92.3 Platelets 270 [place] 11-12, no overt GI bleeding, stable -monitor H/H - check iron/ferritin/B12   AKI on CKD3 BUN 50 Cr 2.34  GFR 31  Potassium 3.9  Magnesium 1.9    CHF (on 11/13/22 at [facility]) Tropinin 31, BNP >4,700. Negative VQ scan -on Entresto, being held -cardiac consult yesterday, EF 25% global hypokinesis with severe right ventricular enlargement and RVSP 67 moderate MR proceeding with right heart catheterization tomorrow -Will hold off until cardiac clearance is provided. - appreciate allergy assistance   A flutter s/p ablation 2019-   Principal Problem:   AKI (acute kidney injury) (HCC) Active Problems:   Hypokalemia   Type 2 diabetes mellitus with other specified complication (HCC)   Abdominal pain   Chronic diastolic congestive heart failure (HCC)   Acute HFrEF (heart failure with reduced ejection fraction) (HCC)    LOS: 1 day   Doree Albee  11/22/2022, 12:38 PM

## 2022-11-22 NOTE — Assessment & Plan Note (Signed)
Pending BMP. Replace as necessary. - Monitor with BMP - Replete as necessary

## 2022-11-23 ENCOUNTER — Encounter (HOSPITAL_COMMUNITY)
Admission: EM | Disposition: A | Discharge status: Home / Self Care | Payer: Self-pay | Source: Home / Self Care | Attending: Family Medicine

## 2022-11-23 ENCOUNTER — Observation Stay (HOSPITAL_COMMUNITY): Payer: Medicare Other

## 2022-11-23 ENCOUNTER — Encounter (HOSPITAL_COMMUNITY): Payer: Self-pay | Admitting: Family Medicine

## 2022-11-23 ENCOUNTER — Other Ambulatory Visit: Payer: Self-pay

## 2022-11-23 DIAGNOSIS — I5041 Acute combined systolic (congestive) and diastolic (congestive) heart failure: Secondary | ICD-10-CM

## 2022-11-23 DIAGNOSIS — I5082 Biventricular heart failure: Secondary | ICD-10-CM

## 2022-11-23 DIAGNOSIS — N179 Acute kidney failure, unspecified: Secondary | ICD-10-CM

## 2022-11-23 DIAGNOSIS — I272 Pulmonary hypertension, unspecified: Secondary | ICD-10-CM

## 2022-11-23 DIAGNOSIS — I5021 Acute systolic (congestive) heart failure: Secondary | ICD-10-CM | POA: Diagnosis not present

## 2022-11-23 DIAGNOSIS — I5043 Acute on chronic combined systolic (congestive) and diastolic (congestive) heart failure: Secondary | ICD-10-CM | POA: Diagnosis not present

## 2022-11-23 HISTORY — PX: RIGHT HEART CATH: CATH118263

## 2022-11-23 LAB — COMPREHENSIVE METABOLIC PANEL
ALT: 39 U/L (ref 0–44)
AST: 28 U/L (ref 15–41)
Albumin: 3.2 g/dL — ABNORMAL LOW (ref 3.5–5.0)
Alkaline Phosphatase: 52 U/L (ref 38–126)
Anion gap: 10 (ref 5–15)
BUN: 46 mg/dL — ABNORMAL HIGH (ref 6–20)
CO2: 21 mmol/L — ABNORMAL LOW (ref 22–32)
Calcium: 8.7 mg/dL — ABNORMAL LOW (ref 8.9–10.3)
Chloride: 108 mmol/L (ref 98–111)
Creatinine, Ser: 2.02 mg/dL — ABNORMAL HIGH (ref 0.61–1.24)
GFR, Estimated: 37 mL/min — ABNORMAL LOW (ref >60)
Glucose, Bld: 103 mg/dL — ABNORMAL HIGH (ref 70–99)
Potassium: 3.8 mmol/L (ref 3.5–5.1)
Sodium: 139 mmol/L (ref 135–145)
Total Bilirubin: 0.6 mg/dL (ref 0.3–1.2)
Total Protein: 6.3 g/dL — ABNORMAL LOW (ref 6.5–8.1)

## 2022-11-23 LAB — CBC
HCT: 33.9 % — ABNORMAL LOW (ref 39.0–52.0)
Hemoglobin: 11.3 g/dL — ABNORMAL LOW (ref 13.0–17.0)
MCH: 30.9 pg (ref 26.0–34.0)
MCHC: 33.3 g/dL (ref 30.0–36.0)
MCV: 92.6 fL (ref 80.0–100.0)
Platelets: 242 10*3/uL (ref 150–400)
RBC: 3.66 MIL/uL — ABNORMAL LOW (ref 4.22–5.81)
RDW: 17.3 % — ABNORMAL HIGH (ref 11.5–15.5)
WBC: 4.6 10*3/uL (ref 4.0–10.5)
nRBC: 0 % (ref 0.0–0.2)

## 2022-11-23 LAB — GLUCOSE, CAPILLARY
Glucose-Capillary: 90 mg/dL (ref 70–99)
Glucose-Capillary: 95 mg/dL (ref 70–99)
Glucose-Capillary: 119 mg/dL — ABNORMAL HIGH (ref 70–99)
Glucose-Capillary: 101 mg/dL — ABNORMAL HIGH (ref 70–99)

## 2022-11-23 LAB — COOXEMETRY PANEL
Carboxyhemoglobin: 1.6 % — ABNORMAL HIGH (ref 0.5–1.5)
Methemoglobin: <0.7 % (ref 0.0–1.5)
O2 Saturation: 62.3 %
Total hemoglobin: 11.9 g/dL — ABNORMAL LOW (ref 12.0–16.0)

## 2022-11-23 SURGERY — RIGHT HEART CATH

## 2022-11-23 MED ORDER — MIDAZOLAM HCL 2 MG/2ML IJ SOLN
INTRAMUSCULAR | Status: DC | PRN
  Administered 2022-11-23: 2 mg via INTRAVENOUS

## 2022-11-23 MED ORDER — ACETAMINOPHEN 325 MG PO TABS: 650.0000 mg | ORAL_TABLET | ORAL | Status: DC | PRN

## 2022-11-23 MED ORDER — LIDOCAINE HCL (PF) 1 % IJ SOLN
INTRAMUSCULAR | Status: AC
  Filled 2022-11-23: qty 30

## 2022-11-23 MED ORDER — SODIUM CHLORIDE 0.9% FLUSH: 3.0000 mL | INTRAVENOUS | Status: DC | PRN

## 2022-11-23 MED ORDER — FENTANYL CITRATE (PF) 100 MCG/2ML IJ SOLN
INTRAMUSCULAR | Status: AC
  Filled 2022-11-23: qty 2

## 2022-11-23 MED ORDER — SODIUM CHLORIDE 0.9% FLUSH
10.0000 mL | INTRAVENOUS | Status: DC | PRN
  Administered 2022-11-23 – 2022-11-28 (×2): 10 mL

## 2022-11-23 MED ORDER — SODIUM CHLORIDE 0.9% FLUSH
10.0000 mL | Freq: Two times a day (BID) | INTRAVENOUS | Status: DC
  Administered 2022-11-23 – 2022-11-26 (×6): 10 mL
  Administered 2022-11-27: 20 mL
  Administered 2022-11-27 – 2022-11-28 (×2): 10 mL

## 2022-11-23 MED ORDER — SODIUM CHLORIDE 0.9% FLUSH
3.0000 mL | Freq: Two times a day (BID) | INTRAVENOUS | Status: DC
  Administered 2022-11-23 – 2022-11-28 (×6): 3 mL via INTRAVENOUS

## 2022-11-23 MED ORDER — LIDOCAINE HCL (PF) 1 % IJ SOLN
INTRAMUSCULAR | Status: DC | PRN
  Administered 2022-11-23: 5 mL

## 2022-11-23 MED ORDER — ENOXAPARIN SODIUM 30 MG/0.3ML IJ SOSY: 30.0000 mg | PREFILLED_SYRINGE | INTRAMUSCULAR | Status: DC

## 2022-11-23 MED ORDER — MIDAZOLAM HCL 2 MG/2ML IJ SOLN
INTRAMUSCULAR | Status: AC
  Filled 2022-11-23: qty 2

## 2022-11-23 MED ORDER — POTASSIUM CHLORIDE CRYS ER 20 MEQ PO TBCR
20.0000 meq | EXTENDED_RELEASE_TABLET | Freq: Once | ORAL | Status: AC
  Administered 2022-11-23: 20 meq via ORAL
  Filled 2022-11-23: qty 1

## 2022-11-23 MED ORDER — LORAZEPAM 2 MG/ML IJ SOLN
1.0000 mg | Freq: Once | INTRAMUSCULAR | Status: AC
  Administered 2022-11-24: 1 mg via INTRAVENOUS
  Filled 2022-11-23: qty 1

## 2022-11-23 MED ORDER — SODIUM CHLORIDE 0.9 % IV SOLN: 250.0000 mL | INTRAVENOUS | Status: DC | PRN

## 2022-11-23 MED ORDER — CHLORHEXIDINE GLUCONATE CLOTH 2 % EX PADS
6.0000 | MEDICATED_PAD | Freq: Every day | CUTANEOUS | Status: DC
  Administered 2022-11-23 – 2022-11-28 (×5): 6 via TOPICAL

## 2022-11-23 MED ORDER — CARVEDILOL 12.5 MG PO TABS
12.5000 mg | ORAL_TABLET | Freq: Two times a day (BID) | ORAL | Status: DC
  Administered 2022-11-23 – 2022-11-25 (×4): 12.5 mg via ORAL
  Filled 2022-11-23 (×4): qty 1

## 2022-11-23 MED ORDER — LABETALOL HCL 5 MG/ML IV SOLN: 10.0000 mg | INTRAVENOUS | Status: AC | PRN

## 2022-11-23 MED ORDER — HYDRALAZINE HCL 20 MG/ML IJ SOLN: 10.0000 mg | INTRAMUSCULAR | Status: AC | PRN

## 2022-11-23 MED ORDER — FUROSEMIDE 10 MG/ML IJ SOLN
80.0000 mg | Freq: Two times a day (BID) | INTRAMUSCULAR | Status: DC
  Administered 2022-11-23 – 2022-11-25 (×4): 80 mg via INTRAVENOUS
  Filled 2022-11-23 (×4): qty 8

## 2022-11-23 MED ORDER — ONDANSETRON HCL 4 MG/2ML IJ SOLN: 4.0000 mg | Freq: Four times a day (QID) | INTRAMUSCULAR | Status: DC | PRN

## 2022-11-23 MED ORDER — HEPARIN (PORCINE) IN NACL 1000-0.9 UT/500ML-% IV SOLN
INTRAVENOUS | Status: DC | PRN
  Administered 2022-11-23: 500 mL

## 2022-11-23 MED ORDER — ISOSORBIDE MONONITRATE ER 60 MG PO TB24
60.0000 mg | ORAL_TABLET | Freq: Every day | ORAL | Status: DC
  Administered 2022-11-24 – 2022-11-28 (×5): 60 mg via ORAL
  Filled 2022-11-23 (×5): qty 1

## 2022-11-23 MED ORDER — ENOXAPARIN SODIUM 40 MG/0.4ML IJ SOSY
40.0000 mg | PREFILLED_SYRINGE | INTRAMUSCULAR | Status: DC
  Administered 2022-11-24 – 2022-11-26 (×3): 40 mg via SUBCUTANEOUS
  Filled 2022-11-23 (×3): qty 0.4

## 2022-11-23 MED ORDER — HYDRALAZINE HCL 50 MG PO TABS
50.0000 mg | ORAL_TABLET | Freq: Three times a day (TID) | ORAL | Status: DC
  Administered 2022-11-23 – 2022-11-24 (×4): 50 mg via ORAL
  Filled 2022-11-23 (×4): qty 1

## 2022-11-23 MED ORDER — FENTANYL CITRATE (PF) 100 MCG/2ML IJ SOLN
INTRAMUSCULAR | Status: DC | PRN
  Administered 2022-11-23: 25 ug via INTRAVENOUS

## 2022-11-23 SURGICAL SUPPLY — 4 items
CATH BALLN WEDGE 5F 110CM (CATHETERS) IMPLANT
PACK CARDIAC CATHETERIZATION (CUSTOM PROCEDURE TRAY) IMPLANT
SHEATH GLIDE SLENDER 4/5FR (SHEATH) IMPLANT
WIRE EMERALD 3MM-J .025X260CM (WIRE) IMPLANT

## 2022-11-23 NOTE — TOC Initial Note (Signed)
Transition of Care [facility]) - Initial/Assessment Note    Patient Details  Name: Isaac Valdez MRN: [id number] Date of Birth: 06/26/1960  Transition of Care [facility]) CM/SW Contact:    Nicanor Bake Phone Number: [phone] 11/23/2022, 3:33 PM  Clinical Narrative: HF CSW could not assess pt. Pt was receiving a sterile procedure at bedside. CSW will follow up as appropriate. TOC will continue following.                          Patient Goals and CMS Choice            Expected Discharge Plan and Services                                              Prior Living Arrangements/Services                       Activities of Daily Living Home Assistive Devices/Equipment: BIPAP, Blood pressure cuff, CBG Meter, Reacher, Scales, Shower chair with back ADL Screening (condition at time of admission) Patient's cognitive ability adequate to safely complete daily activities?: Yes Is the patient deaf or have difficulty hearing?: No Does the patient have difficulty seeing, even when wearing glasses/contacts?: No Does the patient have difficulty concentrating, remembering, or making decisions?: No Patient able to express need for assistance with ADLs?: Yes Does the patient have difficulty dressing or bathing?: No Independently performs ADLs?: Yes (appropriate for developmental age) Does the patient have difficulty walking or climbing stairs?: No Weakness of Legs: None Weakness of Arms/Hands: None  Permission Sought/Granted                  Emotional Assessment              Admission diagnosis:  Hypokalemia [E87.6] Hypomagnesemia [E83.42] Chronic diastolic congestive heart failure (HCC) [I50.32] AKI (acute kidney injury) (HCC) [N17.9] Abdominal pain, unspecified abdominal location [R10.9] Type 2 diabetes mellitus with other specified complication, unspecified whether long term insulin use (HCC) [E11.69] Patient Active Problem List   Diagnosis  Date Noted   Acute HFrEF (heart failure with reduced ejection fraction) (HCC) 11/22/2022   Unintentional weight loss 11/22/2022   Chronic diarrhea 11/22/2022   Heme positive stool 11/22/2022   Abdominal pain 11/20/2022   Chronic diastolic congestive heart failure (HCC) 11/20/2022   Metabolic syndrome 01/25/2018   Left arm pain 08/16/2017   Dyskinesia 06/24/2017   GERD (gastroesophageal reflux disease) 03/19/2017   Type II diabetes mellitus with renal manifestations (HCC) 03/19/2017   Gout 03/19/2017   Hyperkalemia 03/19/2017   Leukocytosis 03/19/2017   Acute renal failure (HCC)    Moderate to severe mitral regurgitation 10/06/2016   Hypertension 08/10/2016   Acute gout due to renal impairment involving left knee 08/10/2016   AKI (acute kidney injury) (HCC) 08/09/2016   Alcohol use 08/09/2016   History of gastric ulcer 08/09/2016   Medically noncompliant 08/09/2016   Hypertensive urgency 03/05/2016   Chest pain 03/05/2016   Chronic systolic heart failure (HCC) 03/05/2016   Typical atrial flutter (HCC)    Atrial flutter (HCC) 03/04/2016   Lumbosacral radiculopathy 11/21/2015   Trimalleolar fracture of right ankle, closed, initial encounter 04/08/2015   Hypercholesterolemia 01/18/2015   Benign essential hypertension 01/18/2015   Cardiomyopathy (HCC) 01/18/2015   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   Morbid obesity  due to excess calories (HCC) 01/03/2015   Hypokalemia 01/03/2015   Shortness of breath 01/02/2015   Acute exacerbation of congestive heart failure (HCC) 01/02/2015   Chronic kidney disease, stage II (mild) 01/02/2015   H/O medication noncompliance 01/02/2015   Obesity 01/02/2015   Obstructive sleep apnea 01/02/2015   Atrial flutter with rapid ventricular response (HCC) 01/02/2015   Acute sinusitis 05/09/2013   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) 05/09/2013   Temporomandibular joint disorder 05/09/2013   Type 2 diabetes mellitus with  other specified complication (HCC) 05/09/2013   Type II diabetes mellitus with neurological manifestations (HCC) 05/09/2013   Vitamin D deficiency 02/07/2013   Back pain 01/27/2012   Hand pain 01/27/2012   Wrist pain 01/27/2012   PCP:  Jerrell Mylar, PA-C Pharmacy:   [facility] - [place], [place] - [address] [place] [address] Phone: [phone] Fax: [phone]  [id number] - [place], [place] - [address]OSS [address] [place] [address] Phone: [phone] Fax: [phone]     Social Determinants of Health (SDOH) Social History: SDOH Screenings   Food Insecurity: No Food Insecurity (11/19/2022)  Housing: Low Risk  (11/19/2022)  Transportation Needs: No Transportation Needs (11/19/2022)  Utilities: Not At Risk (11/19/2022)  Financial Resource Strain: Low Risk  (07/21/2022)   Received from [facility]  Social Connections: Unknown (07/20/2021)   Received from [facility], [facility]  Stress: No Stress Concern Present (08/11/2022)   Received from [facility]  Tobacco Use: High Risk (11/19/2022)   SDOH Interventions:     Readmission Risk Interventions    11/20/2022    4:50 PM  Readmission Risk Prevention Plan  Post Dischage Appt Complete  Medication Screening Complete  Transportation Screening Complete

## 2022-11-23 NOTE — Plan of Care (Signed)
  Problem: Skin Integrity: Goal: Risk for impaired skin integrity will decrease Outcome: Progressing   Problem: Education: Goal: Knowledge of General Education information will improve Description: Including pain rating scale, medication(s)/side effects and non-pharmacologic comfort measures Outcome: Progressing   Problem: Clinical Measurements: Goal: Ability to maintain clinical measurements within normal limits will improve Outcome: Progressing Goal: Will remain free from infection Outcome: Progressing Goal: Respiratory complications will improve Outcome: Progressing Goal: Cardiovascular complication will be avoided Outcome: Progressing   Problem: Activity: Goal: Risk for activity intolerance will decrease Outcome: Progressing   Problem: Pain Managment: Goal: General experience of comfort will improve Outcome: Progressing   Problem: Safety: Goal: Ability to remain free from injury will improve Outcome: Progressing

## 2022-11-23 NOTE — Progress Notes (Signed)
   CVP 14 CO-OX 62%.   Continue to diurese with IV lasix. No indication for milrinone.   Marty Uy NP-C  3:44 PM

## 2022-11-23 NOTE — Plan of Care (Signed)
Patient has been to heart cath today. Right radial was used to access.No issues or complaints at the site. The patient has had a PICC line placed to monitor for CVP, pt lab work, and condition. IV lasix given to the patient. Will continue to monitor patient.

## 2022-11-23 NOTE — Assessment & Plan Note (Addendum)
K of 3.8 today. S/p Kcl.

## 2022-11-23 NOTE — Interval H&P Note (Signed)
History and Physical Interval Note:  11/23/2022 9:44 AM  Isaac Valdez  has presented today for surgery, with the diagnosis of HF.  The various methods of treatment have been discussed with the patient and family. After consideration of risks, benefits and other options for treatment, the patient has consented to  Procedure(s): RIGHT HEART CATH (N/A) as a surgical intervention.  The patient's history has been reviewed, patient examined, no change in status, stable for surgery.  I have reviewed the patient's chart and labs.  Questions were answered to the patient's satisfaction.     [name]

## 2022-11-23 NOTE — Progress Notes (Signed)
Peripherally Inserted Central Catheter Placement  The IV Nurse has discussed with the patient and/or persons authorized to consent for the patient, the purpose of this procedure and the potential benefits and risks involved with this procedure.  The benefits include less needle sticks, lab draws from the catheter, and the patient may be discharged home with the catheter. Risks include, but not limited to, infection, bleeding, blood clot (thrombus formation), and puncture of an artery; nerve damage and irregular heartbeat and possibility to perform a PICC exchange if needed/ordered by physician.  Alternatives to this procedure were also discussed.  Bard Power PICC patient education guide, fact sheet on infection prevention and patient information card has been provided to patient /or left at bedside.    PICC Placement Documentation  PICC Double Lumen 11/23/22 Left Brachial 47 cm 2 cm (Active)  Indication for Insertion or Continuance of Line Chronic illness with exacerbations (CF, Sickle Cell, etc.) 11/23/22 1451  Exposed Catheter (cm) 2 cm 11/23/22 1451  Site Assessment Clean, Dry, Intact 11/23/22 1451  Lumen #1 Status Flushed;Saline locked;Blood return noted 11/23/22 1451  Lumen #2 Status Flushed;Saline locked;Blood return noted 11/23/22 1451  Dressing Type Transparent;Securing device 11/23/22 1451  Dressing Status Antimicrobial disc in place;Clean, Dry, Intact 11/23/22 1451  Line Care Connections checked and tightened 11/23/22 1451  Line Adjustment (NICU/IV Team Only) No 11/23/22 1451  Dressing Intervention New dressing 11/23/22 1451  Dressing Change Due 11/30/22 11/23/22 1451       Timmothy Sours 11/23/2022, 2:55 PM

## 2022-11-23 NOTE — Progress Notes (Signed)
Patient Name: Isaac Valdez Date of Encounter: 11/23/2022 [facility] Cardiologist: Nanetta Batty, MD   Interval Summary  .    62 year old male with past medical history of chronic diastolic heart failure, type 2 diabetes, CKD stage III-IV, atrial flutter status post ablation 2019, hyperlipidemia, hypertension, GERD, gout, OSA, who presented for abdominal pain/nausea/vomiting/diarrhea, cardiology was consulted on 11/20/2022 for preop evaluation for endoscopy and colonoscopy.    He was found in acute on chronic combined  heart failure, BNP 3209, high sensitive troponin 26>23, echocardiogram with LVEF 25 to 30% (historically down to 45-50% 2017, was 55-60% in 07/2022 [facility])  , global hypokinesis, mild to moderate LV dilatation, indeterminate diastolic parameter, mildly reduced RV, severely enlarged RV, severely elevated PASP 67.4 mmHg, severe LAE, mild to moderate MR, mild to moderate TR, mild AI.  He was started on IV Lasix, this was held in the setting of CKD stage IV and difficult clinical exam regarding volume status.  Right heart catheterization is planned today.   Patient states he had ongoing SOB with exertion, occasional orthopnea, epigastric /rib pain when moving around in the house over the past 2 weeks. He feels comfortable now. He has no chest pain, SOB, and feels well in flat position. He states sometimes he forgets to use CPAP.     Vital Signs .    Vitals:   11/22/22 2055 11/22/22 2354 11/23/22 0500 11/23/22 0834  BP: (!) 156/90 137/80 (!) 161/90 (!) 159/85  Pulse: 60 (!) 50    Resp: 18 18 18 18   Temp: 98.5 F (36.9 C)  97.7 F (36.5 C) 97.6 F (36.4 C)  TempSrc: Oral  Oral Oral  SpO2: 100% 100%    Weight:   114.5 kg   Height:        Intake/Output Summary (Last 24 hours) at 11/23/2022 0901 Last data filed at 11/23/2022 0601 Gross per 24 hour  Intake 0 ml  Output --  Net 0 ml      11/23/2022    5:00 AM 11/19/2022    9:29 PM 11/19/2022   11:04 AM  Last  3 Weights  Weight (lbs) 252 lb 6.4 oz 252 lb 13.9 oz 250 lb  Weight (kg) 114.488 kg 114.7 kg 113.399 kg      Telemetry/ECG    Sinus rhythms, occasional PVCs, sinus bradycardia high 40s noted  - Personally Reviewed  Physical Exam .   GEN: No acute distress.  Obese  Neck: No JVD Cardiac: RRR, no murmurs, rubs, or gallops.  Respiratory: Clear to auscultation bilaterally. On room air.  GI: Soft, nontender, non-distended  MS: No leg edema  Assessment & Plan .     Acute systolic and diastolic heart failure  - Echo in 2017 with LVEF down to 45-50%, Echo in 09/2017 at [facility] LVEF 45-50%, mild aortic stenosis, Echo in 07/2022 at [facility] with LVEF 55-60%, grade III/IV DD; Echo now with LVEF 25-30%, global hypokinesis, mod RV dysfunction, RVE severe enlargement, PASP 67, severe LAE, mod to severe MR  - presented with abdominal pain/N/V/D - BNP 3209, Hs trop flat 20s, CXR without acute finding - clinical exam has been difficult due to body habitus, trial IV Lasix 33m,g x1 on Sat, Cr up trend from 2.04 >2.34, I&O not tracked, weight unchanged, diuresis held now, RHC planned today, clinically he is not volume overloaded, keep NPO  - Etiology unclear, 08/2022 LHC at [facility] showed very mild nonobstructive disease, likely plan for cMRI after RHC  - GDMT: on PTA  coreg and entresto, CKD III-IV, Entresto stopped, not ideal to add SGLT2I and MRA, coreg 25mg  BID continued, added hydralazine 25mg  TID and imdur 30mg  daily; BP elevated, will increase hydralazine to 50mg  TID today   Pulmonary HTN  - Echo as above  - suspect due to OSA/obesity   Elevated troponin - Hs trop 26-->23, flat low, not consistent with ACS - LHC 08/2022 at [facility] Mild, non-obstructive coronary artery disease; LVEF 45% - no indication for ischemic testing at this time.   Type 2 DM CKD III-IV HTN OSA HLD - per family med    For questions or updates, please contact [facility] Please consult [email or web address] for contact  info under        Signed, Cyndi Bender, NP

## 2022-11-23 NOTE — Consult Note (Addendum)
Advanced Heart Failure Team Consult Note   Primary Physician: Jerrell Mylar, PA-C PCP-Cardiologist:  Dr Chales Abrahams  Reason for Consultation: Heart Failure   HPI:    Isaac Valdez is seen today for evaluation of heart failure at the request of Dr Cristal Deer.   Isaac Valdez is a 62 year old with a history of DMII, PAF, A flutter ablation, OSA, HTN, HLD, GERD, mitral regurgitation, movement disorder, gout, ETOH abuse, cocaine last used 2009, and newly reduced LVEF.   He quit drinking for 7 years . Started drinking a lot in 2020. Drinks heavy on the weekend 12 -24 beers + 1 pint Friday, Saturday, and Sunday.   Echo 2018 EF 45-50%.   In 2019 he underwent aflutter ablation. Eliquis was stopped.   Recently established with Cardiology, Dr Chales Abrahams at [place]. 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 In May he had an abnormal stress test and was set up for cath. LHC showed  minimal disease. LVEDP 16. LVEF 45%.    He was seen at [facility] ED on 11/13/22 with nausea, vomiting, abdominal bloating and increased dyspnea. NT-Pro BNP > 10000. Started on PPI and discharged to home.   He was seen in consultation at [facility] in the [facility] for RUE jerking/twitching on 11/19/22. Work up concerning for dystonia. Imaging ordered. Says he felt terrible that day and went straight to [facility] for admit.   Denies recent viral illness. Takes motrin 2-3 days week + tylenol. He takes torsemide intermittently. He has been using Bipap intermittently. Continues to drink heavily of the weekend.   Admitted 11/19/22 with nausea, bloating, and epigastric pain. CXR with cardiomegaly. CT abd/pelvis- small hiatal hernia, mild gallbladder thickening, and min free fluid lower abd/pelvis. VQ - negative for PE. Pertinent admission labs included: creatinine 2.25, HS Trop 26>23, HIV NR, TSH 1.0, and Hgb 12.4.    GI consulted for possible EGD/Colonoscopy. Started on PPI. Cardiology consulted  for clearance. Echo showed reduced LVEF 25-30%, RV moderately redcued, LA severely dilated, MV mod-severe MVR. BN{P > 3200. Volume overloaded and has been diuresing with IV lasix. Weight up 2 pounds. Had RHC today with RA 18, PA 99/31 (51), PCWP mean 29, PVR 3.3, PA sat 65%, CO 6.5, and CI 2.8.   Appetite still off. Remains SOB withe xeriton.   Review of Systems: [y] = yes, [ ]  = no   General: Weight gain [ ] ; Weight loss [ ] ; Anorexia [ ] ; Fatigue [ Y]; Fever [ ] ; Chills [ ] ; Weakness [ Y]  Cardiac: Chest pain/pressure [ ] ; Resting SOB [ ] ; Exertional SOB [ Y]; Orthopnea [ Y]; Pedal Edema [ ] ; Palpitations [ ] ; Syncope [ ] ; Presyncope [ ] ; Paroxysmal nocturnal dyspnea[ ]   Pulmonary: Cough [ ] ; Wheezing[ ] ; Hemoptysis[ ] ; Sputum [ ] ; Snoring [ ]   GI: Vomiting[ ] ; Dysphagia[ ] ; Melena[ ] ; Hematochezia [ ] ; Heartburn[ ] ; Abdominal pain [ ] ; Constipation [ ] ; Diarrhea [ ] ; BRBPR [ ]   GU: Hematuria[ ] ; Dysuria [ ] ; Nocturia[ ]   Vascular: Pain in legs with walking [ ] ; Pain in feet with lying flat [ ] ; Non-healing sores [ ] ; Stroke [ ] ; TIA [ ] ; Slurred speech [ ] ;  Neuro: Headaches[ ] ; Vertigo[ ] ; Seizures[ ] ; Paresthesias[ ] ;Blurred vision [ ] ; Diplopia [ ] ; Vision changes [ ]   Ortho/Skin: Arthritis [ ] ; Joint pain [Y ]; Muscle pain [ ] ; Joint swelling [ ] ; Back Pain [ Y]; Rash [ ]   Psych:  Depression[ ] ; Anxiety[ ]   Heme: Bleeding problems [ ] ; Clotting disorders [ ] ; Anemia [ ]   Endocrine: Diabetes [Y ]; Thyroid dysfunction[ ]   Home Medications Prior to Admission medications   Medication Sig Start Date End Date Taking? Authorizing Provider  acetaminophen (TYLENOL) 500 MG tablet Take 1,000 mg by mouth 2 (two) times daily as needed for moderate pain.   Yes [provider]  albuterol (VENTOLIN HFA) 108 (90 Base) MCG/ACT inhaler Inhale 1-2 puffs into the lungs every 6 (six) hours as needed for wheezing or shortness of breath. 03/11/22  Yes Long, Arlyss Repress, MD  atorvastatin (LIPITOR) 20 MG  tablet Take 20 mg by mouth daily.   Yes [provider]  carvedilol (COREG) 25 MG tablet Take 1 tablet by mouth twice daily 05/19/21  Yes Camnitz, Will Daphine Deutscher, MD  clonazePAM (KLONOPIN) 0.5 MG tablet Take 1 tablet (0.5 mg total) by mouth as needed. Patient taking differently: Take 0.5 mg by mouth 2 (two) times daily. 08/15/20  Yes McBane, Jerald Kief, PA-C  colchicine 0.6 MG tablet Take as directed for gout flare. 07/16/21  Yes Molpus, John, MD  esomeprazole (NEXIUM) 40 MG capsule Take 40 mg by mouth daily at 12 noon.   Yes [provider]  glipiZIDE (GLUCOTROL) 5 MG tablet Take 5 mg by mouth daily as needed (CBG >120).  08/12/16  Yes [provider]  sacubitril-valsartan (ENTRESTO) 24-26 MG Take 1 tablet by mouth 2 (two) times daily.   Yes [provider]    Past Medical History: Past Medical History:  Diagnosis Date   Arthritis    Back pain    CHF (congestive heart failure) (HCC)    Diabetes mellitus    Dyspnea    GERD (gastroesophageal reflux disease)    Gout    High cholesterol    Hypertension    Sleep apnea    uses BIPAP nightly    Past Surgical History: Past Surgical History:  Procedure Laterality Date   A-FLUTTER ABLATION N/A 04/21/2017   Procedure: A-FLUTTER ABLATION;  Surgeon: Regan Lemming, MD;  Location: [facility];  Service: Cardiovascular;  Laterality: N/A;   BACK SURGERY     MANDIBLE FRACTURE SURGERY     SHOULDER ARTHROSCOPY WITH BICEPSTENOTOMY Right 08/15/2020   Procedure: SHOULDER ARTHROSCOPY WITH BICEPSTENOTOMY;  Surgeon: Bjorn Pippin, MD;  Location: [facility];  Service: Orthopedics;  Laterality: Right;   SHOULDER ARTHROSCOPY WITH ROTATOR CUFF REPAIR Right 08/15/2020   Procedure: SHOULDER ARTHROSCOPY WITH ARTHROSCOPIC ROTATOR CUFF REPAIR;  Surgeon: Bjorn Pippin, MD;  Location: [facility];  Service: Orthopedics;  Laterality: Right;    Family History: Family History  Problem Relation Age of  Onset   Heart disease Mother    Hypertension Sister    Other Father        hypothermia   Heart failure Son     Social History: Social History   Socioeconomic History   Marital status: Married    Spouse name: Not on file   Number of children: 2   Years of education: 11   Highest education level: 11th grade  Occupational History   Occupation: part time truck driver   Occupation: on disability  Tobacco Use   Smoking status: Some Days    Current packs/day: 0.50    Types: Cigars, Cigarettes   Smokeless tobacco: Never   Tobacco comments:    occais cigar  Vaping Use   Vaping status: Never Used  Substance and  Sexual Activity   Alcohol use: Yes    Comment: social   Drug use: No   Sexual activity: Yes    Birth control/protection: None  Other Topics Concern   Not on file  Social History Narrative   Lives with wife in a one story home.  Has 2 children.     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     Education: 11th grade.    Right handed   Social Determinants of Health   Financial Resource Strain: Low Risk  (07/21/2022)   Received from [facility]   Overall Financial Resource Strain (CARDIA)    Difficulty of Paying Living Expenses: Not hard at all  Food Insecurity: No Food Insecurity (11/19/2022)   Hunger Vital Sign    Worried About Running Out of Food in the Last Year: Never true    Ran Out of Food in the Last Year: Never true  Transportation Needs: No Transportation Needs (11/19/2022)   PRAPARE - Administrator, Civil Service (Medical): No    Lack of Transportation (Non-Medical): No  Physical Activity: Not on file  Stress: No Stress Concern Present (08/11/2022)   Received from [facility] - Occupational Stress Questionnaire    Feeling of Stress : Only a little  Social Connections: Unknown (07/20/2021)   Received from [facility]   Social Network    Social Network: Not on file     Allergies:  Allergies  Allergen Reactions   Asa [Aspirin] Other (See Comments)    Indigestion Stomach discomfort    Objective:    Vital Signs:   Temp:  [97.6 F (36.4 C)-98.5 F (36.9 C)] 98 F (36.7 C) (09/16 1027) Pulse Rate:  [50-60] 57 (09/16 1027) Resp:  [16-18] 18 (09/16 1027) BP: (127-161)/(72-90) 138/77 (09/16 1027) SpO2:  [100 %] 100 % (09/15 2354) Weight:  [114.5 kg] 114.5 kg (09/16 0500) Last BM Date : 11/23/22  Weight change: Filed Weights   11/19/22 1104 11/19/22 2129 11/23/22 0500  Weight: 113.4 kg 114.7 kg 114.5 kg    Intake/Output:   Intake/Output Summary (Last 24 hours) at 11/23/2022 1116 Last data filed at 11/23/2022 0601 Gross per 24 hour  Intake 0 ml  Output --  Net 0 ml      Physical Exam    General:  Sitting in the chair. No resp difficulty HEENT: normal Neck: supple. JVP difficult to assess. . Carotids 2+ bilat; no bruits. No lymphadenopathy or thyromegaly appreciated. Cor: PMI nondisplaced. Regular rate & rhythm. No rubs, gallops or murmurs. Lungs: clear Abdomen: soft, nontender, distended. No hepatosplenomegaly. No bruits or masses. Good bowel sounds. Extremities: no cyanosis, clubbing, rash, edema Neuro: alert & orientedx3, cranial nerves grossly intact. moves all 4 extremities w/o difficulty. Affect pleasant   Telemetry   SR/SB with PVCs  EKG    SB 58 bpm   Labs   Basic Metabolic Panel: Recent Labs  Lab 11/19/22 1111 11/19/22 1200 11/20/22 1010 11/20/22 1447 11/21/22 0220 11/22/22 0648 11/23/22 0415  NA 139  --  140  --  138 137 139  K 3.2*  --  4.3  --  3.8 3.9 3.8  CL 108  --  110  --  108 107 108  CO2 19*  --  22  --  18* 20* 21*  GLUCOSE 139*  --  112*  --  117* 128* 103*  BUN 29*  --  32*  --  42* 50* 46*  CREATININE 2.25*  --  2.06*  --  2.04* 2.34* 2.02*  CALCIUM 8.9  --  9.0  --  8.6* 8.8* 8.7*  MG  --  1.6*  --  1.9 1.9  --   --     Liver Function Tests: Recent Labs  Lab 11/19/22 1111  11/20/22 1010 11/21/22 0220 11/22/22 0648 11/23/22 0415  AST 24 25 35 42* 28  ALT 22 25 32 46* 39  ALKPHOS 61 62 67 60 52  BILITOT 1.0 0.9 0.2* 0.7 0.6  PROT 7.0 7.0 6.4* 6.7 6.3*  ALBUMIN 3.7 3.6 3.3* 3.4* 3.2*   Recent Labs  Lab 11/19/22 1111  LIPASE 30   No results for input(s): "AMMONIA" in the last 168 hours.  CBC: Recent Labs  Lab 11/19/22 1111 11/20/22 1010 11/21/22 0220 11/22/22 0648 11/23/22 0415  WBC 4.4 4.0 4.8 4.7 4.6  NEUTROABS 2.7  --   --   --   --   HGB 11.6* 12.4* 11.2* 11.8* 11.3*  HCT 36.4* 39.4 35.6* 37.0* 33.9*  MCV 94.8 95.2 92.7 92.3 92.6  PLT 259 263 251 270 242    Cardiac Enzymes: No results for input(s): "CKTOTAL", "CKMB", "CKMBINDEX", "TROPONINI" in the last 168 hours.  BNP: BNP (last 3 results) Recent Labs    11/21/22 0220  BNP 3,209.1*    ProBNP (last 3 results) No results for input(s): "PROBNP" in the last 8760 hours.   CBG: Recent Labs  Lab 11/21/22 2118 11/22/22 0806 11/22/22 1217 11/22/22 1555 11/23/22 0738  GLUCAP 104* 104* 116* 124* 90    Coagulation Studies: No results for input(s): "LABPROT", "INR" in the last 72 hours.   Imaging   CARDIAC CATHETERIZATION  Result Date: 11/23/2022   Hemodynamic findings consistent with severe pulmonary hypertension. Severely elevated right heart catheterization findings with severe pulmonary hypertension with PA pressure 99/31 and mean pressure 51 mmHg. PW pressure 29 ,mm Hg, with V wave increased to 52 consistent with the patient's moderately severe mitral regurgitation.     Medications:     Current Medications:  carvedilol  25 mg Oral BID   diphenhydrAMINE  25 mg Oral BID   feeding supplement  237 mL Oral BID BM   hydrALAZINE  50 mg Oral TID   insulin aspart  0-9 Units Subcutaneous TID WC   isosorbide mononitrate  30 mg Oral Daily   pantoprazole  40 mg Oral Daily    Infusions:     Patient Profile  Isaac Valdez is a 62 year old with a history of DMII, PAF,  A flutter ablation, OSA, HTN, HLD, GERD, mitral regurgitation, movement disorder, gout, ETOH abuse, cocaine last used 2009, and new biventricular heart failure.   Assessment/Plan  1. Abdominal Pain -CT abd/pelvis- no acute findings ? Related to low output HF  - GI following - On PPI   2. Acute Biventricular HFrEF, NICM  Had LHC in June of this year with minimal disease.  Echo 2018 EF 45-50%. Echo this admit showed reduced EF 25-30%+ RV moderately reduced.   Etiology unclear. It is possible this is due to alcohol and hypertension.  - BNP on admit >3200. Underwent RHC today with elevated filling pressures.  RA 18, PA 99/31 (29), PCWP 29, PA sats 65%, fick CO 6.5 and CI 2.8  - Will need CMRI to further assess though not sure he will able to complete due to RUE movement.  - On exam he appears volume overloaded. Place PICC for CVP  and CO-OX. If CO-OX low will need to add milrinone.  - Start lasix 80 mg twice a day.   - Cut back bb with acute decompensation.  - GDMT limited with AKI.   3. AKI -Earlier this year creatinine was 1.2. Taking motrin prior to admit. Discussed avoiding.  -On admit creatinine 2.25-->today 2 -Renal US- no hydronephrosis  - Avoid hypotension.   4. OSA -Wears intermittently.  -Discussed using Bipap nightly.   5. ETOH -Heavy drinker for many years. Had stopped for 7 years but started drinking again 3 years ago.  -Discussed alcohol cessation.   6. Pulmonary Hypertension -RHC PA PA 99/31 (51), PCWP mean 29, PVR 3.3 -Known history of OSA. Continue Bipap.   7. H/O A flutter Ablation  -On bb.  -S/P ablation 2019 by Dr Linde Gillis.  - In SR today.   8. Movement Disorder -[facility] established at [facility]   Length of Stay: 1  Amy Clegg, NP  11/23/2022, 11:16 AM  Advanced Heart Failure Team Pager [phone] (M-F; 7a - 5p)  Please contact [facility] for night-coverage after hours (4p -7a ) and weekends on [email or web address]  Patient seen with  NP, agree with the above note.   62 y.o. with history of atrial flutter s/p ablation, type 2 diabetes, dystonia, OSA on bipap was admitted with CHF.  Patient had an echo in 2018 with EF 45-50%.  He had repeat echo in 5/24 showing EF 55-60% but possible posterior hypokinesis.  GIven this finding, he had a cath in 6/24 showing minimal CAD.   Patient reports several weeks of nausea, vomiting, abdominal pain, and weight loss prior to admission.  Also short of breath walking to mailbox.  On admission, echo showed EF 25-30%, moderate RV dysfunction with moderate RV enlargement, severe LAE, dilated IVC, moderate-severe Isaac.  Diuresis has been limited by renal dysfunction, creatinine 2.2 => 2.0.  CT abdomen with no significant pathology. V/Q scan negative for PE.  RHC done today showing elevated filling pressures, CI 2.8, and severe mixed pulmonary arterial/pulmonary venous hypertension.   General: NAD Neck: JVP 14-16 cm, no thyromegaly or thyroid nodule.  Lungs: Clear to auscultation bilaterally with normal respiratory effort. CV: Nondisplaced PMI.  Heart regular S1/S2, no S3/S4, no murmur.  No peripheral edema.  No carotid bruit.  Normal pedal pulses.  Abdomen: Soft, nontender, no hepatosplenomegaly, no distention.  Skin: Intact without lesions or rashes.  Neurologic: Alert and oriented x 3.  Psych: Normal affect. Extremities: No clubbing or cyanosis.  HEENT: Normal.   1. Acute systolic CHF: Echo this admission with EF 25-30%, moderate RV dysfunction with moderate RV enlargement, severe LAE, dilated IVC, moderate-severe Isaac.  Recent cath in 6/24 with minimal CAD (though echo in 5/24 with EF 55-60%).  RHC with mean RA 18, PA 99/31 mean 51, mean PCWP 29, CI 2.8, PVR 3.4 WU.  Uncertain etiology of cardiomyopathy, significant drop in the last 4 months based on 5/24 and 9/24 echoes.  Think coronary disease is unlikely based on 6/24 cath.  He has a history of moderate-heavy ETOH but this has been chronic and  would not cause acute drop.  Remote cocaine use. His son had a cardiomyopathy with sudden death in his 60s, so cannot rule out some form of familial cardiomyopathy.  Myocarditis remains a possibility.  Situation complicated by cardiorenal syndrome with creatinine 2 (Ibuprofen use may have played a role in this).  He is volume overloaded on exam and by RHC today.  - He  needs diuresis, start with Lasix 80 mg IV bid.  - PICC placed, follow CVP and co-ox.  Low threshold for milrinone use.  - No Entresto with elevated creatinine.  Started hydralazine 50 mg tid + Imdur 60 daily.  - With volume overload/CHF, decreased Coreg to 12.5 mg bid.  - I will order cardiac MRI to look for infiltrative disease/myocarditis.  - Needs to cut back on ETOH.  - Consider [facility] gene testing as outpatient.  2. Mitral regurgitation: I reviewed the echo, suspect functional Isaac with dilated annulus.  No more than moderate-severe Isaac, not severe.  Mitraclip may be a consideration down the road.  3. Atrial flutter: S/p ablation, he has been in NSR here.  4. HTN: Added hydralazine today.  5. OSA: On Bipap at home. 6. Pulmonary hypertension: Severe mixed PAH/PVH on RHC today.  OSA likely contributes.  V/Q scan was not suggestive of chronic PEs.  - Start with diuresis, reassess down the road.  7. AKI: Baseline creatinine appears to have been around 1.2.  Currently 2.  ?Cardiorenal, ibuprofen use likely contributes. - Stop NSAIDs.   - Follow creatinine closely with diuresis.  8. Movement disorder: dystonia, followed at [facility].  9. Abdominal discomfort/nausea: CT abdomen/pelvis was unremarkable. LFTs normal.  Possible abdominal discomfort and nausea due to GI congestion from CHF.  GI is following.   Marca Ancona 11/23/2022 3:46 PM

## 2022-11-23 NOTE — Assessment & Plan Note (Addendum)
Most recent creatinine 2.02 this morning (prior baseline  ~1.2-1.4).  Unclear whether this is new baseline, will need to balance this with elevated BNP and worsening heart function. Renal ultrasound unremarkable. - Avoid additional fluids  - Avoid NSAIDs and nephrotoxic drugs  - Pending Urine microalbumin creatinine ratio - Continue to hold Entresto at this time - Morning BMP, CBC, Mg

## 2022-11-23 NOTE — Assessment & Plan Note (Addendum)
Abdominal pain resolved this afternoon. RUQ [place] and CT A/P with nonspecific gallbladder wall thickening. -GI following, Cardiology following - Holding off on GI scoping iso severe pulm htn  - Tylenol prn for pain - Zofran prn for N/V - Protonix 40mg  daily

## 2022-11-23 NOTE — Progress Notes (Signed)
   11/22/22 2354  BiPAP/CPAP/SIPAP  BiPAP/CPAP/SIPAP Pt Type Adult  BiPAP/CPAP/SIPAP Resmed  Mask Type Full face mask  Patient Home Equipment Yes  Safety Check Completed by RT for Home Unit Yes, no issues noted  BiPAP/CPAP /SiPAP Vitals  Resp 18  BP 137/80  SpO2 100 %  MEWS Score/Color  MEWS Score 0  MEWS Score Color [place] Si

## 2022-11-23 NOTE — Progress Notes (Signed)
Heart Failure Navigator Progress Note  Assessed for Heart & Vascular TOC clinic readiness.  Patient does not meet criteria due to Advanced Heart Failure Team consulted. .   Navigator will sign off at this time.   Rhae Hammock, BSN, Scientist, clinical (histocompatibility and immunogenetics) Only

## 2022-11-23 NOTE — Progress Notes (Signed)
OT Cancellation Note  Patient Details Name: Isaac Valdez MRN: [id number] DOB: [birth year]   Cancelled Treatment:    Reason Eval/Treat Not Completed: Patient at procedure or test/ unavailable. Pt off unit in OR, OT will follow up next available time as appropriate  Galen Manila 11/23/2022, 10:05 AM

## 2022-11-23 NOTE — Assessment & Plan Note (Addendum)
Well-controlled. Has not required significant insulin dosing. - CBGs before meals  - SSI - Hold home glipizide

## 2022-11-23 NOTE — Progress Notes (Signed)
Daily Progress Note Intern Pager: [phone]  Patient name: Isaac Valdez Medical record number: [id number] Date of birth: 05-Oct-1960 Age: 62 y.o. Gender: male  Primary Care Provider: Jerrell Mylar, PA-C Consultants: Cardiology, Advanced Heart Failure, GI Code Status: Full   Pt Overview and Major Events to Date:  9/12: Admitted to FMTS 9/16: Right heart catheterization   Assessment and Plan: Isaac Valdez is a 62 y.o. male with pmh CHF, T2DM who presented with generalized abdominal pain, n/v/d and AKI with new reduced EF (25-30%) now s/p right heart cath as workup prior to EGD/colonoscopy. RHC demonstrated severe pulmonary hypertension, hold off on GI scoping until pressures better controlled.  Assessment & Plan AKI (acute kidney injury) (HCC) Most recent creatinine 2.02 this morning (prior baseline  ~1.2-1.4).  Unclear whether this is new baseline, will need to balance this with elevated BNP and worsening heart function. Renal ultrasound unremarkable. - Avoid additional fluids  - Avoid NSAIDs and nephrotoxic drugs  - Pending Urine microalbumin creatinine ratio - Continue to hold Entresto at this time - Morning BMP, CBC, Mg Chronic diastolic congestive heart failure (HCC) CHF (stage 3 or 4 DD, EF 55-60% on 07/2022 echo). Inpatient 9/13 echo reflects worsened LVEF of 25 to 30% with global hypokinesis and severely dilated LA. BNP 3,209 on 9/14. Now s/p RHC which demonstrated severe pulmonary hypertension.  - Cardiology and advanced heart failure team following, appreciate recommendations - continue to diurese with IV lasix at this time  - PICC line to monitor cardiac pressures, cooxemetry panel to follow  - LPA tomorrow AM  - CTM renal function as above   Type 2 diabetes mellitus with other specified complication (HCC) Well-controlled. Has not required significant insulin dosing. - CBGs before meals  - SSI - Hold home glipizide Abdominal pain (Resolved:  11/23/2022) Abdominal pain resolved this afternoon. RUQ [place] and CT A/P with nonspecific gallbladder wall thickening. -GI following, Cardiology following - Holding off on GI scoping iso severe pulm htn  - Tylenol prn for pain - Zofran prn for N/V - Protonix 40mg  daily Hypokalemia (Resolved: 11/23/2022) K of 3.8 today. S/p Kcl.  Chronic and Stable Issues: HTN  Aflutter: Continue home Coreg 25 mg, not anticoagulated due to low chadvasc score. HLD: Continue home Lipitor 20 Muscle Spasms: Continue home clonazepam 0.5 BID PRN  FEN/GI: Heart diet PPx: Lovenox Dispo: Home pending clinical improvement   Subjective:  Patient is feeling alright post RHC. He is breathing normally, no sharp chest pain. He feels his nausea has improved and has more of an appetite now. No abdominal pain at this time.   Objective: Temp:  [97.6 F (36.4 C)-98.5 F (36.9 C)] 97.8 F (36.6 C) (09/16 1124) Pulse Rate:  [50-60] 51 (09/16 1124) Resp:  [16-18] 18 (09/16 1124) BP: (127-161)/(72-90) 143/83 (09/16 1124) SpO2:  [100 %] 100 % (09/16 1124) Weight:  [114.5 kg] 114.5 kg (09/16 0500) Physical Exam: General: Well-appearing, no acute distress, eating lunch.  Cardiovascular: Decreased S1/S2 secondary to body habitus. No extra heart sounds. Warm and well-perfused.  Respiratory: Breathing comfortably on RA. CTAB. No increased WOB.  Abdomen: Soft, somewhat distended. Non-tender to palpation.   Extremities: No LE edema.   Laboratory: Most recent CBC Lab Results  Component Value Date   WBC 4.6 11/23/2022   HGB 11.3 (L) 11/23/2022   HCT 33.9 (L) 11/23/2022   MCV 92.6 11/23/2022   PLT 242 11/23/2022   Most recent BMP    Latest Ref Rng & Units  11/23/2022    4:15 AM  BMP  Glucose 70 - 99 mg/dL 756   BUN 6 - 20 mg/dL 46   Creatinine 4.33 - 1.24 mg/dL 2.95   Sodium 188 - 416 mmol/L 139   Potassium 3.5 - 5.1 mmol/L 3.8   Chloride 98 - 111 mmol/L 108   CO2 22 - 32 mmol/L 21   Calcium 8.9 - 10.3 mg/dL  8.7     Imaging/Diagnostic Tests: RHC (9/16): Severely elevated right heart catheterization findings with severe pulmonary hypertension with PA pressure 99/31 and mean pressure 51 mmHg. PW pressure 29 ,mm Hg, with V wave increased to 52 consistent with the patient's moderately severe mitral regurgitation.   Isaac Quale, MD 11/23/2022, 1:01 PM  PGY-1, [facility] FPTS Intern pager: [phone], text pages welcome Secure chat group [facility] Teaching Service

## 2022-11-23 NOTE — Assessment & Plan Note (Addendum)
CHF (stage 3 or 4 DD, EF 55-60% on 07/2022 echo). Inpatient 9/13 echo reflects worsened LVEF of 25 to 30% with global hypokinesis and severely dilated LA. BNP 3,209 on 9/14. Now s/p RHC which demonstrated severe pulmonary hypertension.  - Cardiology and advanced heart failure team following, appreciate recommendations - continue to diurese with IV lasix at this time  - PICC line to monitor cardiac pressures, cooxemetry panel to follow  - LPA tomorrow AM  - CTM renal function as above

## 2022-11-24 ENCOUNTER — Inpatient Hospital Stay (HOSPITAL_COMMUNITY): Payer: Medicare Other

## 2022-11-24 ENCOUNTER — Encounter (HOSPITAL_COMMUNITY): Payer: Self-pay | Admitting: Cardiovascular Disease

## 2022-11-24 DIAGNOSIS — I5082 Biventricular heart failure: Secondary | ICD-10-CM | POA: Diagnosis present

## 2022-11-24 DIAGNOSIS — N179 Acute kidney failure, unspecified: Secondary | ICD-10-CM | POA: Diagnosis present

## 2022-11-24 DIAGNOSIS — I5023 Acute on chronic systolic (congestive) heart failure: Secondary | ICD-10-CM | POA: Diagnosis not present

## 2022-11-24 DIAGNOSIS — I428 Other cardiomyopathies: Secondary | ICD-10-CM

## 2022-11-24 DIAGNOSIS — E1122 Type 2 diabetes mellitus with diabetic chronic kidney disease: Secondary | ICD-10-CM | POA: Diagnosis present

## 2022-11-24 DIAGNOSIS — E669 Obesity, unspecified: Secondary | ICD-10-CM | POA: Diagnosis present

## 2022-11-24 DIAGNOSIS — E860 Dehydration: Secondary | ICD-10-CM | POA: Diagnosis present

## 2022-11-24 DIAGNOSIS — K635 Polyp of colon: Secondary | ICD-10-CM | POA: Diagnosis not present

## 2022-11-24 DIAGNOSIS — E1169 Type 2 diabetes mellitus with other specified complication: Secondary | ICD-10-CM | POA: Diagnosis present

## 2022-11-24 DIAGNOSIS — R195 Other fecal abnormalities: Secondary | ICD-10-CM | POA: Diagnosis not present

## 2022-11-24 DIAGNOSIS — K529 Noninfective gastroenteritis and colitis, unspecified: Secondary | ICD-10-CM | POA: Diagnosis not present

## 2022-11-24 DIAGNOSIS — I480 Paroxysmal atrial fibrillation: Secondary | ICD-10-CM | POA: Diagnosis present

## 2022-11-24 DIAGNOSIS — I4729 Other ventricular tachycardia: Secondary | ICD-10-CM | POA: Diagnosis present

## 2022-11-24 DIAGNOSIS — I130 Hypertensive heart and chronic kidney disease with heart failure and stage 1 through stage 4 chronic kidney disease, or unspecified chronic kidney disease: Secondary | ICD-10-CM | POA: Diagnosis present

## 2022-11-24 DIAGNOSIS — I5043 Acute on chronic combined systolic (congestive) and diastolic (congestive) heart failure: Secondary | ICD-10-CM | POA: Diagnosis present

## 2022-11-24 DIAGNOSIS — D509 Iron deficiency anemia, unspecified: Secondary | ICD-10-CM | POA: Diagnosis present

## 2022-11-24 DIAGNOSIS — R634 Abnormal weight loss: Secondary | ICD-10-CM | POA: Diagnosis not present

## 2022-11-24 DIAGNOSIS — I5021 Acute systolic (congestive) heart failure: Secondary | ICD-10-CM | POA: Diagnosis not present

## 2022-11-24 DIAGNOSIS — D123 Benign neoplasm of transverse colon: Secondary | ICD-10-CM | POA: Diagnosis present

## 2022-11-24 DIAGNOSIS — I2729 Other secondary pulmonary hypertension: Secondary | ICD-10-CM | POA: Diagnosis present

## 2022-11-24 DIAGNOSIS — I110 Hypertensive heart disease with heart failure: Secondary | ICD-10-CM | POA: Diagnosis not present

## 2022-11-24 DIAGNOSIS — D120 Benign neoplasm of cecum: Secondary | ICD-10-CM | POA: Diagnosis present

## 2022-11-24 DIAGNOSIS — I472 Ventricular tachycardia, unspecified: Secondary | ICD-10-CM | POA: Diagnosis not present

## 2022-11-24 DIAGNOSIS — R531 Weakness: Secondary | ICD-10-CM | POA: Diagnosis present

## 2022-11-24 DIAGNOSIS — I350 Nonrheumatic aortic (valve) stenosis: Secondary | ICD-10-CM | POA: Diagnosis present

## 2022-11-24 DIAGNOSIS — R109 Unspecified abdominal pain: Secondary | ICD-10-CM | POA: Diagnosis not present

## 2022-11-24 DIAGNOSIS — Z23 Encounter for immunization: Secondary | ICD-10-CM | POA: Diagnosis present

## 2022-11-24 DIAGNOSIS — N184 Chronic kidney disease, stage 4 (severe): Secondary | ICD-10-CM | POA: Diagnosis present

## 2022-11-24 LAB — BASIC METABOLIC PANEL
Anion gap: 6 (ref 5–15)
Anion gap: 11 (ref 5–15)
BUN: 41 mg/dL — ABNORMAL HIGH (ref 6–20)
BUN: 39 mg/dL — ABNORMAL HIGH (ref 6–20)
CO2: 25 mmol/L (ref 22–32)
CO2: 24 mmol/L (ref 22–32)
Calcium: 8.6 mg/dL — ABNORMAL LOW (ref 8.9–10.3)
Calcium: 9.1 mg/dL (ref 8.9–10.3)
Chloride: 108 mmol/L (ref 98–111)
Chloride: 104 mmol/L (ref 98–111)
Creatinine, Ser: 1.58 mg/dL — ABNORMAL HIGH (ref 0.61–1.24)
Creatinine, Ser: 1.48 mg/dL — ABNORMAL HIGH (ref 0.61–1.24)
GFR, Estimated: 50 mL/min — ABNORMAL LOW (ref >60)
GFR, Estimated: 54 mL/min — ABNORMAL LOW (ref >60)
Glucose, Bld: 104 mg/dL — ABNORMAL HIGH (ref 70–99)
Glucose, Bld: 78 mg/dL (ref 70–99)
Potassium: 3.8 mmol/L (ref 3.5–5.1)
Potassium: 3.9 mmol/L (ref 3.5–5.1)
Sodium: 139 mmol/L (ref 135–145)
Sodium: 139 mmol/L (ref 135–145)

## 2022-11-24 LAB — POCT I-STAT EG7
Acid-base deficit: 5 mmol/L — ABNORMAL HIGH (ref 0.0–2.0)
Bicarbonate: 20.8 mmol/L (ref 20.0–28.0)
Calcium, Ion: 1.24 mmol/L (ref 1.15–1.40)
HCT: 38 % — ABNORMAL LOW (ref 39.0–52.0)
Hemoglobin: 12.9 g/dL — ABNORMAL LOW (ref 13.0–17.0)
O2 Saturation: 67 %
Potassium: 3.7 mmol/L (ref 3.5–5.1)
Sodium: 142 mmol/L (ref 135–145)
TCO2: 22 mmol/L (ref 22–32)
pCO2, Ven: 38.2 mmHg — ABNORMAL LOW (ref 44–60)
pH, Ven: 7.343 (ref 7.25–7.43)
pO2, Ven: 37 mmHg (ref 32–45)

## 2022-11-24 LAB — CBC
HCT: 33.2 % — ABNORMAL LOW (ref 39.0–52.0)
Hemoglobin: 10.8 g/dL — ABNORMAL LOW (ref 13.0–17.0)
MCH: 30.2 pg (ref 26.0–34.0)
MCHC: 32.5 g/dL (ref 30.0–36.0)
MCV: 92.7 fL (ref 80.0–100.0)
Platelets: 241 10*3/uL (ref 150–400)
RBC: 3.58 MIL/uL — ABNORMAL LOW (ref 4.22–5.81)
RDW: 17.3 % — ABNORMAL HIGH (ref 11.5–15.5)
WBC: 4.1 10*3/uL (ref 4.0–10.5)
nRBC: 0 % (ref 0.0–0.2)

## 2022-11-24 LAB — GLUCOSE, CAPILLARY
Glucose-Capillary: 98 mg/dL (ref 70–99)
Glucose-Capillary: 108 mg/dL — ABNORMAL HIGH (ref 70–99)
Glucose-Capillary: 107 mg/dL — ABNORMAL HIGH (ref 70–99)
Glucose-Capillary: 114 mg/dL — ABNORMAL HIGH (ref 70–99)

## 2022-11-24 LAB — COOXEMETRY PANEL
Carboxyhemoglobin: 2.1 % — ABNORMAL HIGH (ref 0.5–1.5)
Methemoglobin: 0.8 % (ref 0.0–1.5)
O2 Saturation: 64.5 %
Total hemoglobin: 11.3 g/dL — ABNORMAL LOW (ref 12.0–16.0)

## 2022-11-24 LAB — MAGNESIUM: Magnesium: 1.7 mg/dL (ref 1.7–2.4)

## 2022-11-24 MED ORDER — POTASSIUM CHLORIDE CRYS ER 20 MEQ PO TBCR
40.0000 meq | EXTENDED_RELEASE_TABLET | Freq: Once | ORAL | Status: AC
  Administered 2022-11-24: 40 meq via ORAL
  Filled 2022-11-24: qty 2

## 2022-11-24 MED ORDER — SPIRONOLACTONE 12.5 MG HALF TABLET
12.5000 mg | ORAL_TABLET | Freq: Every day | ORAL | Status: DC
  Administered 2022-11-24 – 2022-11-25 (×2): 12.5 mg via ORAL
  Filled 2022-11-24 (×2): qty 1

## 2022-11-24 MED ORDER — POTASSIUM CHLORIDE CRYS ER 20 MEQ PO TBCR
20.0000 meq | EXTENDED_RELEASE_TABLET | Freq: Once | ORAL | Status: AC
  Administered 2022-11-24: 20 meq via ORAL
  Filled 2022-11-24: qty 1

## 2022-11-24 MED ORDER — DAPAGLIFLOZIN PROPANEDIOL 10 MG PO TABS
10.0000 mg | ORAL_TABLET | Freq: Every day | ORAL | Status: DC
  Administered 2022-11-24 – 2022-11-28 (×5): 10 mg via ORAL
  Filled 2022-11-24 (×5): qty 1

## 2022-11-24 MED ORDER — GADOBUTROL 1 MMOL/ML IV SOLN
10.0000 mL | Freq: Once | INTRAVENOUS | Status: AC | PRN
  Administered 2022-11-24: 10 mL via INTRAVENOUS

## 2022-11-24 MED ORDER — HYDRALAZINE HCL 50 MG PO TABS
75.0000 mg | ORAL_TABLET | Freq: Three times a day (TID) | ORAL | Status: DC
  Administered 2022-11-24 – 2022-11-25 (×3): 75 mg via ORAL
  Filled 2022-11-24 (×3): qty 1

## 2022-11-24 MED ORDER — MAGNESIUM SULFATE 2 GM/50ML IV SOLN
2.0000 g | Freq: Once | INTRAVENOUS | Status: AC
  Administered 2022-11-24: 2 g via INTRAVENOUS
  Filled 2022-11-24: qty 50

## 2022-11-24 NOTE — Progress Notes (Signed)
Notified Dr. Rexene Alberts of 3rd run of vtach. Patient asymptomatic. Provider aware. Potassium given.

## 2022-11-24 NOTE — Progress Notes (Signed)
   11/24/22 0000  BiPAP/CPAP/SIPAP  BiPAP/CPAP/SIPAP Pt Type Adult  BiPAP/CPAP/SIPAP Resmed  Mask Type Full face mask  Patient Home Equipment Yes  BiPAP/CPAP /SiPAP Vitals  Resp 20  BP (!) 157/69  MEWS Score/Color  MEWS Score 0  MEWS Score Color [place] Si

## 2022-11-24 NOTE — Progress Notes (Addendum)
FMTS Interim Progress Note  S: Notified by RN that pt had a 15 beat run of vtach then a 7 beat run. Pt asymptomatic during both and sleeping. HR returned to baseline in the 50s afterwards.   O: BP (!) 157/69 (BP Location: Right Arm)   Pulse (!) 51   Temp 98.3 F (36.8 C) (Oral)   Resp 20   Ht 5\' 11"  (1.803 m)   Wt 114.5 kg   SpO2 100%   BMI 35.20 kg/m   Resting comfortably, no acute distress  A/P: -Spoke with Dr. Cory Munch who recommended continuing to monitor pt's heart rhythm and check Magnesium. Do not want to increase pt's beta blocker at this time due to HR 53. -Last K 3.8 on 9/16, gave 40 meq K PO to replete. Mag ordered for the morning. -Goal K >4, Mg >2 -appreciate cardiology recs  Bowe Sidor, DO 11/24/2022, 12:58 AM PGY-1, [facility] Service pager [phone]

## 2022-11-24 NOTE — Assessment & Plan Note (Signed)
Patient has had multiple episodes of NSVT. Most recent K 3.8, Mg 1.7. - This morning, received Kcl 40 mEq x2 and 2g Mg sulfate - Afternoon BMP  - CTM, replete for K<4, Mg<2

## 2022-11-24 NOTE — Assessment & Plan Note (Addendum)
CHF (stage 3 or 4 DD, EF 55-60% on 07/2022 echo). Inpatient 9/13 echo reflects worsened LVEF of 25 to 30% with global hypokinesis and severely dilated LA. BNP 3,209 on 9/14. Now s/p RHC which demonstrated severe pulmonary hypertension.  - Cardiology and advanced heart failure team following, appreciate recommendations - Continue to diurese with IV lasix 80 BID at this time  - PICC line to monitor cardiac pressures, CVP 7/8 - Cooxemetry stable at 65% today  - LPA tomorrow AM   - Add Imdur 60 and increase Hydral to 75 TID  - Continue reduced dose of Coreg 12.5 BID  - Cardiac MRI to assess for infiltrative disease/myocarditis  - CTM renal function as above

## 2022-11-24 NOTE — Progress Notes (Signed)
Daily Progress Note Intern Pager: [phone]  Patient name: Isaac Valdez Medical record number: [id number] Date of birth: 1961/01/23 Age: 62 y.o. Gender: male  Primary Care Provider: Jerrell Mylar, PA-C Consultants: Cardiology, Advanced HF, GI Code Status: Full  Pt Overview and Major Events to Date:  9/12: Admitted to FMTS 9/16: Right heart catheterization  Assessment and Plan: Isaac Valdez is a 62 y.o. male with pmh CHF, T2DM who presented with generalized abdominal pain, n/v/d and AKI with new reduced EF (25-30%) now s/p right heart cath as workup prior to EGD/colonoscopy. RHC demonstrated severe pulmonary hypertension, hold off on GI scoping until pressures better controlled. Advanced heart failure team following.  Assessment & Plan AKI (acute kidney injury) (HCC) Most recent creatinine 1.58 this morning (prior baseline  ~1.2-1.4), likely improved with diuresis. Renal ultrasound unremarkable. - Avoid additional fluids  - Avoid NSAIDs and nephrotoxic drugs  - Pending Urine microalbumin creatinine ratio - Continue to hold Entresto at this time - Morning BMP, CBC, Mg Chronic diastolic congestive heart failure (HCC) CHF (stage 3 or 4 DD, EF 55-60% on 07/2022 echo). Inpatient 9/13 echo reflects worsened LVEF of 25 to 30% with global hypokinesis and severely dilated LA. BNP 3,209 on 9/14. Now s/p RHC which demonstrated severe pulmonary hypertension.  - Cardiology and advanced heart failure team following, appreciate recommendations - Continue to diurese with IV lasix 80 BID at this time  - PICC line to monitor cardiac pressures, CVP 7/8 - Cooxemetry stable at 65% today  - LPA tomorrow AM   - Add Imdur 60 and increase Hydral to 75 TID  - Continue reduced dose of Coreg 12.5 BID  - Cardiac MRI to assess for infiltrative disease/myocarditis  - CTM renal function as above   Ventricular tachycardia, non-sustained (HCC) Patient has had multiple episodes of NSVT. Most  recent K 3.8, Mg 1.7. - This morning, received Kcl 40 mEq x2 and 2g Mg sulfate - Afternoon BMP  - CTM, replete for K<4, Mg<2 Type 2 diabetes mellitus with other specified complication (HCC) Well-controlled. Has not required significant insulin dosing. - SSI - Hold home glipizide  Chronic and Stable Issues: HTN  Aflutter: Continue reduced Coreg 12.5 mg BID, not anticoagulated due to low chadvasc score. HLD: Continue home Lipitor 20 Muscle Spasms: Continue home clonazepam 0.5 BID PRN OSA: Bipap at night   FEN/GI: Heart diet PPx: Lovenox Dispo: Home pending clinical improvement  Subjective:  Patient feeling unchanged today. Was able to feel fluttering in his chest during Vtach episodes overnight. No CP, SOB, VC. Improved appetite, no N/V/D.   Objective: Temp:  [97.6 F (36.4 C)-98.3 F (36.8 C)] 98 F (36.7 C) (09/17 0900) Pulse Rate:  [51-66] 66 (09/17 0900) Resp:  [18-20] 20 (09/17 0721) BP: (127-157)/(62-83) 156/78 (09/17 0900) SpO2:  [100 %] 100 % (09/16 1124) Physical Exam: General: Well-appearing, no acute distress Cardiovascular: Distant heart sounds. Soft diastolic murmur. No S3/S4. Warm and well-perfused. Respiratory: Breathing comfortably on RA. Clear breath sounds heard bilaterally. No increased WOB. Abdomen: Somewhat distended, non-tender. BS present.  Extremities: No LE edema. No pain to palpation of LE.   Laboratory: Most recent CBC Lab Results  Component Value Date   WBC 4.1 11/24/2022   HGB 10.8 (L) 11/24/2022   HCT 33.2 (L) 11/24/2022   MCV 92.7 11/24/2022   PLT 241 11/24/2022   Most recent BMP    Latest Ref Rng & Units 11/24/2022    5:25 AM  BMP  Glucose 70 -  99 mg/dL 962   BUN 6 - 20 mg/dL 41   Creatinine 9.52 - 1.24 mg/dL 8.41   Sodium 324 - 401 mmol/L 139   Potassium 3.5 - 5.1 mmol/L 3.8   Chloride 98 - 111 mmol/L 108   CO2 22 - 32 mmol/L 25   Calcium 8.9 - 10.3 mg/dL 8.6   Mg 1.7   Imaging/Diagnostic Tests: RHC (9/16): Severely  elevated right heart catheterization findings with severe pulmonary hypertension with PA pressure 99/31 and mean pressure 51 mmHg. PW pressure 29 ,mm Hg, with V wave increased to 52 consistent with the patient's moderately severe mitral regurgitation.   [name], MD 11/24/2022, 10:09 AM  PGY-1, [facility] FPTS Intern pager: [phone], text pages welcome Secure chat group [facility] Teaching Service

## 2022-11-24 NOTE — Assessment & Plan Note (Addendum)
Most recent creatinine 1.58 this morning (prior baseline  ~1.2-1.4), likely improved with diuresis. Renal ultrasound unremarkable. - Avoid additional fluids  - Avoid NSAIDs and nephrotoxic drugs  - Pending Urine microalbumin creatinine ratio - Continue to hold Entresto at this time - Morning BMP, CBC, Mg

## 2022-11-24 NOTE — Progress Notes (Addendum)
Advanced Heart Failure Rounding Note  PCP-Cardiologist: Nanetta Batty, MD   Subjective:   Diuresis started with 80 IV lasix BID. Only UOP documented. Weight unchanged.   CVP 7/8, Co-ox 65%  Was able to sleep better yesterday, still with some mild SOB.   Objective:   Weight Range: 114.5 kg Body mass index is 35.2 kg/m.   Vital Signs:   Temp:  [97.6 F (36.4 C)-98.3 F (36.8 C)] 97.6 F (36.4 C) (09/17 0721) Pulse Rate:  [51-62] 62 (09/17 0721) Resp:  [18-20] 20 (09/17 0721) BP: (127-157)/(62-83) 156/78 (09/17 0841) SpO2:  [100 %] 100 % (09/16 1124) Last BM Date : 11/23/22  Weight change: Filed Weights   11/19/22 1104 11/19/22 2129 11/23/22 0500  Weight: 113.4 kg 114.7 kg 114.5 kg    Intake/Output:   Intake/Output Summary (Last 24 hours) at 11/24/2022 0908 Last data filed at 11/24/2022 0714 Gross per 24 hour  Intake 240 ml  Output 1250 ml  Net -1010 ml     CVP 7/8 Physical Exam  General:  elderly appearing.  No respiratory difficulty. +tremor HEENT: normal Neck: supple. JVD ~7 cm. Carotids 2+ bilat; no bruits. No lymphadenopathy or thyromegaly appreciated. Cor: PMI nondisplaced. Regular rate & rhythm. No rubs, gallops or murmurs. Lungs: clear Abdomen: soft, nontender, nondistended. No hepatosplenomegaly. No bruits or masses. Good bowel sounds. Extremities: no cyanosis, clubbing, rash, edema  Neuro: alert & oriented x 3, cranial nerves grossly intact. moves all 4 extremities w/o difficulty. Affect pleasant.   Telemetry   NSR 60s, 2-3 runs of NSVT longest being 15 beats. (Personally reviewed)    EKG  No new EKG to review  Labs    CBC Recent Labs    11/23/22 0415 11/24/22 0525  WBC 4.6 4.1  HGB 11.3* 10.8*  HCT 33.9* 33.2*  MCV 92.6 92.7  PLT 242 241   Basic Metabolic Panel Recent Labs    56/21/30 0415 11/24/22 0525  NA 139 139  K 3.8 3.8  CL 108 108  CO2 21* 25  GLUCOSE 103* 104*  BUN 46* 41*  CREATININE 2.02* 1.58*   CALCIUM 8.7* 8.6*  MG  --  1.7   Liver Function Tests Recent Labs    11/22/22 0648 11/23/22 0415  AST 42* 28  ALT 46* 39  ALKPHOS 60 52  BILITOT 0.7 0.6  PROT 6.7 6.3*  ALBUMIN 3.4* 3.2*   No results for input(s): "LIPASE", "AMYLASE" in the last 72 hours. Cardiac Enzymes No results for input(s): "CKTOTAL", "CKMB", "CKMBINDEX", "TROPONINI" in the last 72 hours.  BNP: BNP (last 3 results) Recent Labs    11/21/22 0220  BNP 3,209.1*    ProBNP (last 3 results) No results for input(s): "PROBNP" in the last 8760 hours.   D-Dimer No results for input(s): "DDIMER" in the last 72 hours. Hemoglobin A1C No results for input(s): "HGBA1C" in the last 72 hours. Fasting Lipid Panel No results for input(s): "CHOL", "HDL", "LDLCALC", "TRIG", "CHOLHDL", "LDLDIRECT" in the last 72 hours. Thyroid Function Tests No results for input(s): "TSH", "T4TOTAL", "T3FREE", "THYROIDAB" in the last 72 hours.  Invalid input(s): "FREET3"  Other results:   Imaging    [place] EKG SITE RITE  Result Date: 11/23/2022 If Site Rite image not attached, placement could not be confirmed due to current cardiac rhythm.  CARDIAC CATHETERIZATION  Result Date: 11/23/2022   Hemodynamic findings consistent with severe pulmonary hypertension. Severely elevated right heart catheterization findings with severe pulmonary hypertension with PA pressure 99/31 and  mean pressure 51 mmHg. PW pressure 29 ,mm Hg, with V wave increased to 52 consistent with the patient's moderately severe mitral regurgitation.     Medications:     Scheduled Medications:  carvedilol  12.5 mg Oral BID   Chlorhexidine Gluconate Cloth  6 each Topical Daily   diphenhydrAMINE  25 mg Oral BID   enoxaparin (LOVENOX) injection  40 mg Subcutaneous Q24H   feeding supplement  237 mL Oral BID BM   furosemide  80 mg Intravenous BID   hydrALAZINE  50 mg Oral TID   insulin aspart  0-9 Units Subcutaneous TID WC   isosorbide mononitrate  60 mg Oral  Daily   LORazepam  1 mg Intravenous Once   pantoprazole  40 mg Oral Daily   sodium chloride flush  10-40 mL Intracatheter Q12H   sodium chloride flush  3 mL Intravenous Q12H    Infusions:  sodium chloride     magnesium sulfate bolus IVPB 2 g (11/24/22 0832)    PRN Medications: sodium chloride, acetaminophen **OR** acetaminophen, clonazePAM, melatonin, ondansetron (ZOFRAN) IV, ondansetron, polyethylene glycol, senna, sodium chloride flush, sodium chloride flush    Patient Profile   Isaac Valdez is a 62 year old with a history of DMII, PAF, A flutter ablation, OSA, HTN, HLD, GERD, mitral regurgitation, movement disorder, gout, ETOH abuse, cocaine last used 2009, and new biventricular heart failure.   Assessment/Plan  1. Acute systolic CHF: Echo this admission with EF 25-30%, moderate RV dysfunction with moderate RV enlargement, severe LAE, dilated IVC, moderate-severe Isaac.  Recent cath in 6/24 with minimal CAD (though echo in 5/24 with EF 55-60%).  RHC with mean RA 18, PA 99/31 mean 51, mean PCWP 29, CI 2.8, PVR 3.4 WU.  Uncertain etiology of cardiomyopathy, significant drop in the last 4 months based on 5/24 and 9/24 echoes.  Think coronary disease is unlikely based on 6/24 cath.  He has a history of moderate-heavy ETOH but this has been chronic and would not cause acute drop.  Remote cocaine use. His son had a cardiomyopathy with sudden death in his 58s, so cannot rule out some form of familial cardiomyopathy.  Myocarditis remains a possibility.  Situation complicated by cardiorenal syndrome with creatinine 2 (Ibuprofen use may have played a role in this).  He is volume overloaded on exam and by RHC.  - Continue Lasix 80 mg IV bid. CVP 7/8 - Co-ox stable at 65% today. No indication for milrinone at this time.  - No Entresto with elevated creatinine.  Increase hydralazine 50>75 mg tid + Imdur 60 daily. If renal function continues to improve can add losartan.  - With volume overload/CHF,  continue reduced dose of Coreg 12.5 mg bid.  - cMRI to look for infiltrative disease/myocarditis.  - Needs to cut back on ETOH.  - Consider [employer] gene testing as outpatient.  2. Mitral regurgitation: I reviewed the echo, suspect functional Isaac with dilated annulus.  No more than moderate-severe Isaac, not severe.  Mitraclip may be a consideration down the road.  3. Atrial flutter: S/p ablation, he has been in NSR here.  4. HTN: Increase hydralazine today.  5. OSA: On Bipap at home. 6. Pulmonary hypertension: Severe mixed PAH/PVH on RHC today.  OSA likely contributes.  V/Q scan was not suggestive of chronic PEs.  - Start with diuresis, reassess down the road.  7. AKI: Baseline creatinine appears to have been around 1.2.  Peaked at 2.  ?Cardiorenal, ibuprofen use likely contributes. - Stop NSAIDs.   -  Improving with diuresis. 1.58 today.  - avoid hypotension 8. Movement disorder: dystonia, [facility] at [facility].  9. Abdominal discomfort/nausea: CT abdomen/pelvis was unremarkable. LFTs normal.  Possible abdominal discomfort and nausea due to GI congestion from CHF.  GI is following.  10. NSVT  - several runs overnight - K and Mg stable, both repleted this morning.  - Can repeat BMET later today    Length of Stay: 0  Isaac Bleacher, NP  11/24/2022, 9:08 AM  Advanced Heart Failure Team Pager [phone] (M-F; 7a - 5p)  Please contact [facility] for night-coverage after hours (5p -7a ) and weekends on [email or web address]  Patient seen with NP, agree with the above note.   CVP 10 on my read, co-ox 64.5%.  Creatinine 2.02 => 1.58.  Reports good UOP so far today.   General: NAD Neck: JVP 10 cm, no thyromegaly or thyroid nodule.  Lungs: Clear to auscultation bilaterally with normal respiratory effort. CV: Nondisplaced PMI.  Heart regular S1/S2, no S3/S4, no murmur.  No peripheral edema.   Abdomen: Soft, nontender, no hepatosplenomegaly, no distention.  Skin: Intact without lesions or rashes.  Neurologic:  Alert and oriented x 3.  Psych: Normal affect. Extremities: No clubbing or cyanosis.  HEENT: Normal.   Today, will continue Lasix 80 mg IV bid.  Increase hydralazine to 75 mg tid and continue Imdur 60.  Add spironolactone 12.5 daily and Farxiga 10 mg daily.  If creatinine continues to trend down, hopefully can start [employer].  Cardiac MRI today, will review.   Creatinine trending down, watch closely.   Abdominal discomfort/nausea could be from CHF, GI following.  If he needs endoscopy, could do as early as tomorrow given improvement in volume overload.   Isaac Valdez 11/24/2022 1:41 PM

## 2022-11-24 NOTE — Progress Notes (Signed)
Occupational Therapy Treatment Patient Details Name: Isaac Valdez MRN: [id number] DOB: [birth year] Today's Date: 11/24/2022   History of present illness 62 y.o. male presents to [facility] hospital on 11/19/2022 with abdominal pain, nausea and vomiting. CT and ultrasound abdomen unremarkable. Pt found with AKI and hypokalemia. PMH includes aflutter, HLD, HTN, CHF, DMII, GERD.   OT comments  Pt seen for OT tx session, pt continues to report spasms/pain in R side of neck and R side of face vs RUE. Denies sensation changes. Pt educated on cervical ROM exercises/stretches, handout also provided to pt. Pt managing urinal independently during session, reports he has been independently mobilizing in room. Pt presenting with impairments listed below, will follow acutely to maximize safety/ind with ADLs/mobility.      If plan is discharge home, recommend the following:  Assistance with cooking/housework   Equipment Recommendations  None recommended by OT    Recommendations for Other Services      Precautions / Restrictions Restrictions Weight Bearing Restrictions: No       Mobility Bed Mobility Overal bed mobility: Independent                  Transfers                         Balance                                           ADL either performed or assessed with clinical judgement   ADL Overall ADL's : Modified independent                                       General ADL Comments: independently managing urinal seated EOB    Extremity/Trunk Assessment Upper Extremity Assessment Upper Extremity Assessment: RUE deficits/detail RUE Deficits / Details: Pt with spasms in R shoulder which are chronic, otherwise [facility]   Lower Extremity Assessment Lower Extremity Assessment: Defer to PT evaluation        Vision   Vision Assessment?: No apparent visual deficits   Perception Perception Perception: Not tested   Praxis  Praxis Praxis: Not tested    Cognition Arousal: Alert Behavior During Therapy: WFL for tasks assessed/performed Overall Cognitive Status: Within Functional Limits for tasks assessed                                          Exercises Other Exercises Other Exercises: cervical flex/ext x 5 Other Exercises: cervical rotation x 5 Other Exercises: cervical lateral flex x 5 Other Exercises: SCM stretch x 5    Shoulder Instructions       General Comments VSS on RA    Pertinent Vitals/ Pain       Pain Assessment Pain Assessment: Faces Pain Score: 3  Faces Pain Scale: Hurts a little bit Pain Location: R shoulder and face 2/2 spasms Pain Descriptors / Indicators: Spasm Pain Intervention(s): Limited activity within patient's tolerance, Monitored during session, Repositioned  Home Living Family/patient expects to be discharged to:: Private residence Living Arrangements: Spouse/significant other  Prior Functioning/Environment              Frequency  Min 1X/week        Progress Toward Goals  OT Goals(current goals can now be found in the care plan section)  Progress towards OT goals: Progressing toward goals  Acute Rehab OT Goals Patient Stated Goal: none stated OT Goal Formulation: With patient Time For Goal Achievement: 12/04/22 Potential to Achieve Goals: Good ADL Goals Pt/caregiver will Perform Home Exercise Program: Right Upper extremity;Both right and left upper extremity;Increased strength Additional ADL Goal #1: Pt will identify and implement 2+ energy conservation strategies for use in the home setting.  Plan      Co-evaluation                 AM-PAC OT "6 Clicks" Daily Activity     Outcome Measure   Help from another person eating meals?: None Help from another person taking care of personal grooming?: None Help from another person toileting, which includes using toliet,  bedpan, or urinal?: None Help from another person bathing (including washing, rinsing, drying)?: None Help from another person to put on and taking off regular upper body clothing?: None Help from another person to put on and taking off regular lower body clothing?: None 6 Click Score: 24    End of Session    OT Visit Diagnosis: Unsteadiness on feet (R26.81);Muscle weakness (generalized) (M62.81)   Activity Tolerance Patient tolerated treatment well   Patient Left in bed;with call bell/phone within reach   Nurse Communication Mobility status        Time: 4782-9562 OT Time Calculation (min): 22 min  Charges: OT General Charges $OT Visit: 1 Visit OT Treatments $Therapeutic Activity: 8-22 mins  Carver Fila, OTD, OTR/L SecureChat Preferred Acute Rehab [phone]   Dalphine Handing 11/24/2022, 4:37 PM

## 2022-11-24 NOTE — Progress Notes (Signed)
Patient ID: Isaac Valdez, male   DOB: [birth year], 62 y.o.   MRN: [id number]    Progress Note   Subjective   Day # 6 CC;nausea, abdominal  pain poor intake prior to admit- anemia/ heme +  Acute congestive heart failure/cardiomyopathy-EF 25 to 30% Right heart cath yesterday consistent with severe pulmonary hypertension  Cardiac MRI pending  Diuresing with IV Lasix 80 mg twice daily  Says he is feeling better he is able to eat at this point, says he had been feeling very bloated and uncomfortable and did not have any appetite prior to admission no current complaints of abdominal pain Feels his breathing is improved as well  Labs-WBC 4.1/hemoglobin 10.8/hematocrit 33.2-down from hemoglobin 12.4  4 days ago BUN/creatinine 1.58   Objective   Vital signs in last 24 hours: Temp:  [97.6 F (36.4 C)-98.3 F (36.8 C)] 98.1 F (36.7 C) (09/17 1214) Pulse Rate:  [55-66] 62 (09/17 1214) Resp:  [19-20] 20 (09/17 1214) BP: (127-157)/(62-89) 155/89 (09/17 1214) SpO2:  [100 %] 100 % (09/17 1214) Last BM Date : 11/22/22 General:    [name] in NAD-wearing CPAP Heart:  Regular rate and rhythm; no murmurs Lungs: Clear Abdomen:  Soft, nontender and nondistended. Normal bowel sounds.  No fluid wave Extremities:  Without edema. Neurologic:  Alert and oriented,  grossly normal neurologically. Psych:  Cooperative. Normal mood and affect.  Intake/Output from previous day: 09/16 0701 - 09/17 0700 In: 240 [P.O.:240] Out: 900 [Urine:900] Intake/Output this shift: Total I/O In: -  Out: 1000 [Urine:1000]  Lab Results: Recent Labs    11/22/22 0648 11/23/22 0415 11/24/22 0525  WBC 4.7 4.6 4.1  HGB 11.8* 11.3* 10.8*  HCT 37.0* 33.9* 33.2*  PLT 270 242 241   BMET Recent Labs    11/22/22 0648 11/23/22 0415 11/24/22 0525  NA 137 139 139  K 3.9 3.8 3.8  CL 107 108 108  CO2 20* 21* 25  GLUCOSE 128* 103* 104*  BUN 50* 46* 41*  CREATININE 2.34* 2.02* 1.58*   CALCIUM 8.8* 8.7* 8.6*   LFT Recent Labs    11/23/22 0415  PROT 6.3*  ALBUMIN 3.2*  AST 28  ALT 39  ALKPHOS 52  BILITOT 0.6   PT/INR No results for input(s): "LABPROT", "INR" in the last 72 hours.  Studies/Results: [place] EKG SITE RITE  Result Date: 11/23/2022 If Site Rite image not attached, placement could not be confirmed due to current cardiac rhythm.  CARDIAC CATHETERIZATION  Result Date: 11/23/2022   Hemodynamic findings consistent with severe pulmonary hypertension. Severely elevated right heart catheterization findings with severe pulmonary hypertension with PA pressure 99/31 and mean pressure 51 mmHg. PW pressure 29 ,mm Hg, with V wave increased to 52 consistent with the patient's moderately severe mitral regurgitation.       Assessment / Plan:    #75 62 year old African-American male admitted with complaints of abdominal discomfort nausea and poor intake, and found to have acute on chronic congestive heart failure With EF 25 to 30%, moderate RV dysfunction, severe LAE, dilated IVC and moderate to severe mitral regurg Right heart cath yesterday consistent with severe pulmonary hypertension Etiology of the cardiomyopathy is not entirely clear  Regurgitation moderate to severe  He is being diuresed with IV Lasix Cardiac MRI pending  #2 history of atrial flutter status post ablation #3 hypertension #4.  Sleep apnea with BiPAP use #5 acute kidney injury ? Cardiorenal  #6 anemia, heme positive stool-patient was to have a workup as  an outpatient per [facility], drink the bowel prep and then was told that they could not do the procedure due to his congestive heart failure. Plan per GI here was for EGD and colonoscopy after cardiac clearance  HGB relatively stable On Protonix  Plan; GI will follow along, can plan for EGD and colonoscopy when he is optimized from a cardiac standpoint and prior to discharge from hospital.  Patient is in agreement with this  plan.         Principal Problem:   AKI (acute kidney injury) (HCC) Active Problems:   Type 2 diabetes mellitus with other specified complication (HCC)   Chronic diastolic congestive heart failure (HCC)   Acute HFrEF (heart failure with reduced ejection fraction) (HCC)   Unintentional weight loss   Chronic diarrhea   Heme positive stool   Ventricular tachycardia, non-sustained (HCC)     LOS: 0 days   Isaac Jost PA-C 11/24/2022, 2:51 PM

## 2022-11-24 NOTE — Assessment & Plan Note (Addendum)
Well-controlled. Has not required significant insulin dosing. - SSI - Hold home glipizide

## 2022-11-25 DIAGNOSIS — I5021 Acute systolic (congestive) heart failure: Secondary | ICD-10-CM | POA: Diagnosis not present

## 2022-11-25 DIAGNOSIS — N179 Acute kidney failure, unspecified: Secondary | ICD-10-CM | POA: Diagnosis not present

## 2022-11-25 LAB — BASIC METABOLIC PANEL WITH GFR
Anion gap: 13 (ref 5–15)
BUN: 44 mg/dL — ABNORMAL HIGH (ref 6–20)
CO2: 25 mmol/L (ref 22–32)
Calcium: 9.3 mg/dL (ref 8.9–10.3)
Chloride: 101 mmol/L (ref 98–111)
Creatinine, Ser: 1.65 mg/dL — ABNORMAL HIGH (ref 0.61–1.24)
GFR, Estimated: 47 mL/min — ABNORMAL LOW (ref >60)
Glucose, Bld: 103 mg/dL — ABNORMAL HIGH (ref 70–99)
Potassium: 4.2 mmol/L (ref 3.5–5.1)
Sodium: 139 mmol/L (ref 135–145)

## 2022-11-25 LAB — GLUCOSE, CAPILLARY
Glucose-Capillary: 90 mg/dL (ref 70–99)
Glucose-Capillary: 92 mg/dL (ref 70–99)
Glucose-Capillary: 84 mg/dL (ref 70–99)

## 2022-11-25 LAB — MAGNESIUM: Magnesium: 2 mg/dL (ref 1.7–2.4)

## 2022-11-25 MED ORDER — MAGNESIUM SULFATE 2 GM/50ML IV SOLN
2.0000 g | Freq: Once | INTRAVENOUS | Status: AC
  Administered 2022-11-25: 2 g via INTRAVENOUS
  Filled 2022-11-25: qty 50

## 2022-11-25 MED ORDER — CARVEDILOL 6.25 MG PO TABS: 18.7500 mg | ORAL_TABLET | Freq: Two times a day (BID) | ORAL | Status: DC

## 2022-11-25 MED ORDER — CARVEDILOL 12.5 MG PO TABS
12.5000 mg | ORAL_TABLET | Freq: Two times a day (BID) | ORAL | Status: DC
  Administered 2022-11-25 – 2022-11-28 (×6): 12.5 mg via ORAL
  Filled 2022-11-25 (×6): qty 1

## 2022-11-25 MED ORDER — POTASSIUM CHLORIDE CRYS ER 20 MEQ PO TBCR
40.0000 meq | EXTENDED_RELEASE_TABLET | Freq: Once | ORAL | Status: AC
  Administered 2022-11-25: 40 meq via ORAL
  Filled 2022-11-25: qty 2

## 2022-11-25 MED ORDER — HYDRALAZINE HCL 50 MG PO TABS
100.0000 mg | ORAL_TABLET | Freq: Three times a day (TID) | ORAL | Status: DC
  Administered 2022-11-25 – 2022-11-28 (×9): 100 mg via ORAL
  Filled 2022-11-25 (×9): qty 2

## 2022-11-25 MED ORDER — SPIRONOLACTONE 25 MG PO TABS
25.0000 mg | ORAL_TABLET | Freq: Every day | ORAL | Status: DC
  Administered 2022-11-26 – 2022-11-28 (×3): 25 mg via ORAL
  Filled 2022-11-25 (×3): qty 1

## 2022-11-25 NOTE — Progress Notes (Deleted)
   11/25/22 2100  BiPAP/CPAP/SIPAP  Reason BIPAP/CPAP not in use Non-compliant (Pt could not tolerate CPAP at 5. Removed from room.)

## 2022-11-25 NOTE — Progress Notes (Signed)
Pt self administering home CPAP.

## 2022-11-25 NOTE — TOC Progression Note (Signed)
Transition of Care [facility]) - Progression Note    Patient Details  Name: Isaac Valdez MRN: [id number] Date of Birth: [birth year]  Transition of Care [facility]) CM/SW Contact  Nicanor Bake Phone Number: [phone] 11/25/2022, 12:49 PM  Clinical Narrative:   HF CSW met with pt at bedside. Pt stated that he was feeling better and just waiting to be seen since the heart doctor. Pt stated that he was ready to go home. CSW explained closer to dc a follow up hospital appointment would be scheduled with PCP.   TOC will continue following.          Expected Discharge Plan and Services                                               Social Determinants of Health (SDOH) Interventions SDOH Screenings   Food Insecurity: No Food Insecurity (11/19/2022)  Housing: Low Risk  (11/19/2022)  Transportation Needs: No Transportation Needs (11/19/2022)  Utilities: Not At Risk (11/19/2022)  Financial Resource Strain: Low Risk  (07/21/2022)   Received from [facility]  Social Connections: Unknown (07/20/2021)   Received from [facility], [facility]  Stress: No Stress Concern Present (08/11/2022)   Received from [facility]  Tobacco Use: High Risk (11/19/2022)    Readmission Risk Interventions    11/20/2022    4:50 PM  Readmission Risk Prevention Plan  Post Dischage Appt Complete  Medication Screening Complete  Transportation Screening Complete

## 2022-11-25 NOTE — Progress Notes (Signed)
Daily Progress Note Intern Pager: [phone]  Patient name: Isaac Valdez Medical record number: [id number] Date of birth: Jul 03, 1960 Age: 62 y.o. Gender: male  Primary Care Provider: Jerrell Mylar, PA-C Consultants: Cardiology, Advanced HF, GI Code Status: Full   Pt Overview and Major Events to Date:  9/12: Admitted to FMTS 9/16: Right heart catheterization   Assessment and Plan: Isaac Valdez is a 62 y.o. male with pmh CHF, T2DM who presented with generalized abdominal pain, n/v/d and AKI with new reduced EF (25-30%) now s/p right heart cath as workup prior to EGD/colonoscopy. RHC demonstrated severe pulmonary hypertension, hold off on GI scoping until pressures better controlled. Advanced heart failure team and GI team following.  Assessment & Plan AKI (acute kidney injury) (HCC) Most recent creatinine 1.61 this morning (prior baseline  ~1.2-1.4), likely improved with diuresis. Renal ultrasound unremarkable. - Avoid additional fluids  - Avoid NSAIDs and nephrotoxic drugs  - Pending Urine microalbumin creatinine ratio - Continue to hold Entresto at this time - Morning BMP, CBC, Mg Acute on chronic systolic and diastolic  (congestive) heart failure (HCC) CHF (stage 3 or 4 DD, EF 55-60% on 07/2022 echo). Inpatient 9/13 echo reflects worsened LVEF of 25 to 30% with global hypokinesis and severely dilated LA. BNP 3,209 on 9/14. Now s/p RHC which demonstrated severe pulmonary hypertension. Cardiac MRI consistent with echo.  - Cardiology and advanced heart failure team following, appreciate recommendations - Euvolemic at this time - Stop lasix. Plan to start torsemide 40 daily tomorrow   - PICC line to monitor cardiac pressures, CVP 1-2 - Cooxemetry stable at 65% today  - Add Imdur 60 and increase Hydral to 75 TID  - Continue reduced dose of Coreg 12.5 BID - CTM renal function as above   Ventricular tachycardia, non-sustained (HCC) Patient has had multiple episodes of  NSVT during admission, largely asympotmatic. 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 Most recent K 3.7, Mg 1.9. - This morning, received Kcl 40 mEq and 2g Mg sulfate - Afternoon BMP  - CTM, replete for K<4, Mg<2 Type 2 diabetes mellitus with other specified complication (HCC) Well-controlled. Has not required significant insulin dosing. - SSI - Hold home glipizide  Heme positive stool Cardiology and GI team to assess patient stability and coordinate for further GI EGD/colonoscopy workup.  Chronic and Stable Issues: HTN  Aflutter: Continue reduced Coreg 12.5 mg BID, not anticoagulated due to low chadvasc score. HLD: Continue home Lipitor 20 Muscle Spasms: Continue home clonazepam 0.5 BID PRN OSA: Bipap at night    FEN/GI: Heart diet PPx: Lovenox Dispo: Home pending clinical improvement   Subjective:  Patient feeling okay this morning. Did not feel any heart fluttering overnight. Breathing mostly normal. No CP, SOB, VC, or HA. Tolerating PO well, no N/V/D. Ambulating independently.    Objective: Temp:  [97.6 F (36.4 C)-98.3 F (36.8 C)] 98 F (36.7 C) (09/17 0900) Pulse Rate:  [51-66] 66 (09/17 0900) Resp:  [18-20] 20 (09/17 0721) BP: (127-157)/(62-83) 156/78 (09/17 0900) SpO2:  [100 %] 100 % (09/16 1124) Physical Exam: General: Well-appearing, no acute distress, resting in bed Cardiovascular: Distant heart sounds. Soft systolic murmur. No S3/S4. Warm and well-perfused. Respiratory: Bipap in place. Clear breath sounds heard bilaterally. No increased WOB. Abdomen: Soft. Improving distention, non-tender. BS present.  Extremities: No LE edema. No pain to palpation of LE.   Laboratory: Most recent CBC Lab Results  Component Value Date   WBC 4.8 11/25/2022   HGB 11.6 (  L) 11/25/2022   HCT 36.4 (L) 11/25/2022   MCV 92.9 11/25/2022   PLT 272 11/25/2022   Most recent BMP    Latest Ref Rng & Units 11/25/2022    5:10 AM  BMP  Glucose 70 - 99 mg/dL 99   BUN 6 - 20  mg/dL 40   Creatinine 1.61 - 1.24 mg/dL 0.96   Sodium 045 - 409 mmol/L 139   Potassium 3.5 - 5.1 mmol/L 3.7   Chloride 98 - 111 mmol/L 104   CO2 22 - 32 mmol/L 24   Calcium 8.9 - 10.3 mg/dL 9.2    Total hemoglobin 12.4, Carboxyhgb 1.8, Methemoglobin <0.7  Imaging/Diagnostic Tests: RHC (9/16): Severely elevated right heart catheterization findings with severe pulmonary hypertension with PA pressure 99/31 and mean pressure 51 mmHg. PW pressure 29 ,mm Hg, with V wave increased to 52 consistent with the patient's moderately severe mitral regurgitation    Isaac Quale, MD 11/25/2022, 2:14 PM  PGY-1, [facility] FPTS Intern pager: [phone], text pages welcome Secure chat group [facility] Teaching Service

## 2022-11-25 NOTE — Progress Notes (Addendum)
Advanced Heart Failure Rounding Note  PCP-Cardiologist: Nanetta Batty, MD   Subjective:   Yesterday diuresed with IV lasix. Brisk diuresis noted. Negative 6075.   CMRI 1. Severely dilated left ventricle with mild concentric LV hypertrophy. EF 28%, diffuse hypokinesis.  2.  Moderately dilated right ventricle with RV EF 30%.  3. Non-coronary pattern LGE involving the basal RV insertion sites (nonspecific, can be seen with pressure/volume overload) and also a diffuse stripe of mid-wall LGE in the basal septal wall. This is also somewhat nonspecific and can be seen with dilated cardiomyopathies.   Feeling better. Denies SOB.    Objective:   Weight Range: 107.5 kg Body mass index is 33.07 kg/m.   Vital Signs:   Temp:  [97.9 F (36.6 C)-98 F (36.7 C)] 97.9 F (36.6 C) (09/17 1937) Pulse Rate:  [57-125] 125 (09/18 0441) Resp:  [20] 20 (09/18 0756) BP: (132-161)/(63-91) 161/89 (09/18 0756) SpO2:  [90 %-100 %] 90 % (09/18 0441) Weight:  [107.5 kg] 107.5 kg (09/18 0628) Last BM Date : 11/23/22  Weight change: Filed Weights   11/19/22 2129 11/23/22 0500 11/25/22 0628  Weight: 114.7 kg 114.5 kg 107.5 kg    Intake/Output:   Intake/Output Summary (Last 24 hours) at 11/25/2022 1231 Last data filed at 11/25/2022 1100 Gross per 24 hour  Intake 40.89 ml  Output 6300 ml  Net -6259.11 ml  CVP 1-2    Physical Exam  General:   No resp difficulty HEENT: normal Neck: supple. no JVD. Carotids 2+ bilat; no bruits. No lymphadenopathy or thryomegaly appreciated. Cor: PMI nondisplaced. Regular rate & rhythm. No rubs, gallops or murmurs. Lungs: clear Abdomen: soft, nontender, nondistended. No hepatosplenomegaly. No bruits or masses. Good bowel sounds. Extremities: no cyanosis, clubbing, rash, edema. RUE PICC  Neuro: alert & orientedx3, cranial nerves grossly intact. moves all 4 extremities w/o difficulty. Affect pleasant  Telemetry   SR 60s   EKG  No new EKG to  review  Labs    CBC Recent Labs    11/24/22 0525 11/25/22 0510  WBC 4.1 4.8  HGB 10.8* 11.6*  HCT 33.2* 36.4*  MCV 92.7 92.9  PLT 241 272   Basic Metabolic Panel Recent Labs    62/13/08 0525 11/24/22 1405 11/25/22 0510  NA 139 139 139  K 3.8 3.9 3.7  CL 108 104 104  CO2 25 24 24   GLUCOSE 104* 78 99  BUN 41* 39* 40*  CREATININE 1.58* 1.48* 1.61*  CALCIUM 8.6* 9.1 9.2  MG 1.7  --  1.9   Liver Function Tests Recent Labs    11/23/22 0415  AST 28  ALT 39  ALKPHOS 52  BILITOT 0.6  PROT 6.3*  ALBUMIN 3.2*   No results for input(s): "LIPASE", "AMYLASE" in the last 72 hours. Cardiac Enzymes No results for input(s): "CKTOTAL", "CKMB", "CKMBINDEX", "TROPONINI" in the last 72 hours.  BNP: BNP (last 3 results) Recent Labs    11/21/22 0220  BNP 3,209.1*    ProBNP (last 3 results) No results for input(s): "PROBNP" in the last 8760 hours.   D-Dimer No results for input(s): "DDIMER" in the last 72 hours. Hemoglobin A1C No results for input(s): "HGBA1C" in the last 72 hours. Fasting Lipid Panel No results for input(s): "CHOL", "HDL", "LDLCALC", "TRIG", "CHOLHDL", "LDLDIRECT" in the last 72 hours. Thyroid Function Tests No results for input(s): "TSH", "T4TOTAL", "T3FREE", "THYROIDAB" in the last 72 hours.  Invalid input(s): "FREET3"  Other results:   Imaging    No results  found.   Medications:     Scheduled Medications:  carvedilol  12.5 mg Oral BID   Chlorhexidine Gluconate Cloth  6 each Topical Daily   dapagliflozin propanediol  10 mg Oral Daily   diphenhydrAMINE  25 mg Oral BID   enoxaparin (LOVENOX) injection  40 mg Subcutaneous Q24H   feeding supplement  237 mL Oral BID BM   furosemide  80 mg Intravenous BID   hydrALAZINE  75 mg Oral TID   isosorbide mononitrate  60 mg Oral Daily   pantoprazole  40 mg Oral Daily   sodium chloride flush  10-40 mL Intracatheter Q12H   sodium chloride flush  3 mL Intravenous Q12H   spironolactone  12.5 mg  Oral Daily    Infusions:  sodium chloride      PRN Medications: sodium chloride, acetaminophen **OR** acetaminophen, clonazePAM, melatonin, ondansetron (ZOFRAN) IV, ondansetron, polyethylene glycol, senna, sodium chloride flush, sodium chloride flush    Patient Profile   Isaac Valdez is a 62 year old with a history of DMII, PAF, A flutter ablation, OSA, HTN, HLD, GERD, mitral regurgitation, movement disorder, gout, ETOH abuse, cocaine last used 2009, and new biventricular heart failure.   Assessment/Plan  1. Acute systolic CHF: Echo this admission with EF 25-30%, moderate RV dysfunction with moderate RV enlargement, severe LAE, dilated IVC, moderate-severe Isaac.  Recent cath in 6/24 with minimal CAD (though echo in 5/24 with EF 55-60%).  RHC with mean RA 18, PA 99/31 mean 51, mean PCWP 29, CI 2.8, PVR 3.4 WU.  Uncertain etiology of cardiomyopathy, significant drop in the last 4 months based on 5/24 and 9/24 echoes.  Think coronary disease is unlikely based on 6/24 cath.  He has a history of moderate-heavy ETOH but this has been chronic and would not cause acute drop.  Remote cocaine use. His son had a cardiomyopathy with sudden death in his 25s, so cannot rule out some form of familial cardiomyopathy.  Myocarditis remains a possibility.  Situation complicated by cardiorenal syndrome with creatinine 2 (Ibuprofen use may have played a role in this).  He was volume overloaded on exam and by RHC.  Now euvolemic. CVP 1-2. Stop lasix. Anticipate starting torsemide 40 mg daily tomorrow.  -CO-OX stable.  - No Entresto with elevated creatinine.  I - Increase hydralazine 100 mg three times mg tid + Imdur 60 daily. If renal function continues to improve can add losartan.  - Continue coreg 12.5 mg twice a day.  Heart rate in the 60s. At home he was taking 25 mg coreg twice a day.  - cMRI -  NICM. LVEF 28%  RVEF 30%  - Needs to cut back on ETOH.  - Consider [employer] gene testing as outpatient.  2. Mitral  regurgitation: Suspect functional Isaac with dilated annulus.  No more than moderate-severe Isaac, not severe.  Mitraclip may be a consideration down the road.  3. Atrial flutter: S/p ablation, he has been in NSR here.  4. HTN: Increase hydralazine today.  5. OSA: On Bipap at home. 6. Pulmonary hypertension: Severe mixed PAH/PVH on RHC today.  OSA likely contributes.  V/Q scan was not suggestive of chronic PEs.  - Start with diuresis, reassess down the road.  7. AKI: Baseline creatinine appears to have been around 1.2.  Peaked at 2.  ?Cardiorenal, ibuprofen use likely contributes. - Stop NSAIDs.   - Creatinine 1.6 today. Stopping IV lasix.   - avoid hypotension - Repeat BMET in am.  8. Movement disorder: dystonia,  [facility].  9. Abdominal discomfort/nausea: CT abdomen/pelvis was unremarkable. LFTs normal.  Possible abdominal discomfort and nausea due to GI congestion from CHF.  GI is following.  10. NSVT    Will discuss timing of EGD/Colonoscopy with Dr Shirlee Latch.   Length of Stay: 1  Isaac Clegg, NP  11/25/2022, 12:31 PM  Advanced Heart Failure Team Pager [phone] (M-F; 7a - 5p)  Please contact [facility] for night-coverage after hours (5p -7a ) and weekends on [email or web address]  Patient seen with NP, agree with the above note.   Excellent diuresis, CVP down to 2.  Creatinine stable at 1.6.  Co-ox 80%.  He feels better, no longer having abdominal pain.   General: NAD Neck: No JVD, no thyromegaly or thyroid nodule.  Lungs: Clear to auscultation bilaterally with normal respiratory effort. CV: Nondisplaced PMI.  Heart regular S1/S2, no S3/S4, no murmur.  No peripheral edema.  Abdomen: Soft, nontender, no hepatosplenomegaly, no distention.  Skin: Intact without lesions or rashes.  Neurologic: Alert and oriented x 3.  Psych: Normal affect. Extremities: No clubbing or cyanosis.  HEENT: Normal.   Euvolemic today, overall has diuresed well. CVP low, can stop IV Lasix. Cardiac MRI with  mid-wall LGE in the basal septum, nonspecific versus possible prior myocarditis.  - Increase spironolactone to 25 mg daily. - Continue Farxiga 10 mg daily.  - Increase hydralazine to 100 tid, continue Imdur 60 daily.  - Continue current Coreg.  - Follow BP and creatinine, may start Entresto tomorrow.   Stable for GI procedures whenever GI wants to schedule.   Isaac Valdez 11/25/2022 4:25 PM

## 2022-11-25 NOTE — Assessment & Plan Note (Signed)
Most recent creatinine 1.61 this morning (prior baseline  ~1.2-1.4), likely improved with diuresis. Renal ultrasound unremarkable. - Avoid additional fluids  - Avoid NSAIDs and nephrotoxic drugs  - Pending Urine microalbumin creatinine ratio - Continue to hold Entresto at this time - Morning BMP, CBC, Mg

## 2022-11-25 NOTE — Assessment & Plan Note (Signed)
CHF (stage 3 or 4 DD, EF 55-60% on 07/2022 echo). Inpatient 9/13 echo reflects worsened LVEF of 25 to 30% with global hypokinesis and severely dilated LA. BNP 3,209 on 9/14. Now s/p RHC which demonstrated severe pulmonary hypertension. Cardiac MRI consistent with echo.  - Cardiology and advanced heart failure team following, appreciate recommendations - Euvolemic at this time - Stop lasix. Plan to start torsemide 40 daily tomorrow   - PICC line to monitor cardiac pressures, CVP 1-2 - Cooxemetry stable at 65% today  - Add Imdur 60 and increase Hydral to 75 TID  - Continue reduced dose of Coreg 12.5 BID - CTM renal function as above

## 2022-11-25 NOTE — Assessment & Plan Note (Signed)
Patient has had multiple episodes of NSVT during admission, largely asympotmatic. 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 Most recent K 3.7, Mg 1.9. - This morning, received Kcl 40 mEq and 2g Mg sulfate - Afternoon BMP  - CTM, replete for K<4, Mg<2

## 2022-11-25 NOTE — Assessment & Plan Note (Signed)
Cardiology and GI team to assess patient stability and coordinate for further GI EGD/colonoscopy workup.

## 2022-11-25 NOTE — Assessment & Plan Note (Addendum)
Well-controlled. Has not required significant insulin dosing. - SSI - Hold home glipizide

## 2022-11-26 DIAGNOSIS — R109 Unspecified abdominal pain: Secondary | ICD-10-CM | POA: Diagnosis not present

## 2022-11-26 DIAGNOSIS — R195 Other fecal abnormalities: Secondary | ICD-10-CM | POA: Diagnosis not present

## 2022-11-26 DIAGNOSIS — D509 Iron deficiency anemia, unspecified: Secondary | ICD-10-CM | POA: Diagnosis not present

## 2022-11-26 DIAGNOSIS — I5023 Acute on chronic systolic (congestive) heart failure: Secondary | ICD-10-CM

## 2022-11-26 DIAGNOSIS — I5021 Acute systolic (congestive) heart failure: Secondary | ICD-10-CM | POA: Diagnosis not present

## 2022-11-26 DIAGNOSIS — I5032 Chronic diastolic (congestive) heart failure: Secondary | ICD-10-CM

## 2022-11-26 DIAGNOSIS — M109 Gout, unspecified: Secondary | ICD-10-CM

## 2022-11-26 DIAGNOSIS — K529 Noninfective gastroenteritis and colitis, unspecified: Secondary | ICD-10-CM | POA: Diagnosis not present

## 2022-11-26 DIAGNOSIS — I4729 Other ventricular tachycardia: Secondary | ICD-10-CM

## 2022-11-26 LAB — COOXEMETRY PANEL
Carboxyhemoglobin: 2.5 % — ABNORMAL HIGH (ref 0.5–1.5)
Methemoglobin: <0.7 % (ref 0.0–1.5)
O2 Saturation: 83 %
Total hemoglobin: 11 g/dL — ABNORMAL LOW (ref 12.0–16.0)

## 2022-11-26 LAB — CBC
HCT: 36.8 % — ABNORMAL LOW (ref 39.0–52.0)
Hemoglobin: 11.9 g/dL — ABNORMAL LOW (ref 13.0–17.0)
MCH: 30.4 pg (ref 26.0–34.0)
MCHC: 32.3 g/dL (ref 30.0–36.0)
MCV: 93.9 fL (ref 80.0–100.0)
Platelets: 249 10*3/uL (ref 150–400)
RBC: 3.92 MIL/uL — ABNORMAL LOW (ref 4.22–5.81)
RDW: 17.2 % — ABNORMAL HIGH (ref 11.5–15.5)
WBC: 5.4 10*3/uL (ref 4.0–10.5)
nRBC: 0 % (ref 0.0–0.2)

## 2022-11-26 LAB — BASIC METABOLIC PANEL
Anion gap: 9 (ref 5–15)
BUN: 40 mg/dL — ABNORMAL HIGH (ref 6–20)
CO2: 25 mmol/L (ref 22–32)
Calcium: 9 mg/dL (ref 8.9–10.3)
Chloride: 104 mmol/L (ref 98–111)
Creatinine, Ser: 1.42 mg/dL — ABNORMAL HIGH (ref 0.61–1.24)
GFR, Estimated: 57 mL/min — ABNORMAL LOW (ref >60)
Glucose, Bld: 101 mg/dL — ABNORMAL HIGH (ref 70–99)
Potassium: 3.7 mmol/L (ref 3.5–5.1)
Sodium: 138 mmol/L (ref 135–145)

## 2022-11-26 LAB — MAGNESIUM: Magnesium: 2.2 mg/dL (ref 1.7–2.4)

## 2022-11-26 LAB — GLUCOSE, CAPILLARY
Glucose-Capillary: 89 mg/dL (ref 70–99)
Glucose-Capillary: 125 mg/dL — ABNORMAL HIGH (ref 70–99)
Glucose-Capillary: 127 mg/dL — ABNORMAL HIGH (ref 70–99)

## 2022-11-26 MED ORDER — PEG-KCL-NACL-NASULF-NA ASC-C 100 G PO SOLR
0.5000 | Freq: Once | ORAL | Status: AC
  Administered 2022-11-26: 100 g via ORAL
  Filled 2022-11-26: qty 1

## 2022-11-26 MED ORDER — POTASSIUM CHLORIDE CRYS ER 20 MEQ PO TBCR
40.0000 meq | EXTENDED_RELEASE_TABLET | Freq: Once | ORAL | Status: AC
  Administered 2022-11-26: 40 meq via ORAL
  Filled 2022-11-26: qty 2

## 2022-11-26 MED ORDER — PEG-KCL-NACL-NASULF-NA ASC-C 100 G PO SOLR: 1.0000 | Freq: Once | ORAL | Status: DC

## 2022-11-26 MED ORDER — PEG-KCL-NACL-NASULF-NA ASC-C 100 G PO SOLR
0.5000 | Freq: Once | ORAL | Status: AC
  Administered 2022-11-26: 100 g via ORAL
  Filled 2022-11-26 (×2): qty 1

## 2022-11-26 MED ORDER — COLCHICINE 0.6 MG PO TABS
0.6000 mg | ORAL_TABLET | Freq: Every day | ORAL | Status: AC
  Administered 2022-11-26 – 2022-11-28 (×3): 0.6 mg via ORAL
  Filled 2022-11-26 (×3): qty 1

## 2022-11-26 MED ORDER — LOSARTAN POTASSIUM 25 MG PO TABS
12.5000 mg | ORAL_TABLET | Freq: Every day | ORAL | Status: DC
  Administered 2022-11-26 – 2022-11-27 (×2): 12.5 mg via ORAL
  Filled 2022-11-26 (×2): qty 1

## 2022-11-26 NOTE — Assessment & Plan Note (Addendum)
Patient experiencing joint pain in his elbows that feels consistent to gout flare.

## 2022-11-26 NOTE — Progress Notes (Signed)
Mobility Specialist Progress Note:   11/26/22 1100  Mobility  Activity Ambulated with assistance in hallway  Level of Assistance Standby assist, set-up cues, supervision of patient - no hands on  Assistive Device None  Distance Ambulated (ft) 400 ft  Activity Response Tolerated well  Mobility Referral Yes  $Mobility charge 1 Mobility  Mobility Specialist Start Time (ACUTE ONLY) 1055  Mobility Specialist Stop Time (ACUTE ONLY) 1108  Mobility Specialist Time Calculation (min) (ACUTE ONLY) 13 min    Pre Mobility: 69 HR During Mobility: 81 HR Post Mobility:  75 HR  Pt received in bed, agreeable to mobility. C/o R shoulder tightness and fatigue, taking x1 standing rest break. VSS throughout. Pt left on EOB with call bell and all needs met.  D'Vante Earlene Plater Mobility Specialist Please contact via Special educational needs teacher or Rehab office at [phone]

## 2022-11-26 NOTE — Assessment & Plan Note (Addendum)
CHF (stage 3 or 4 DD, EF 55-60% on 07/2022 echo). Inpatient 9/13 echo reflects worsened LVEF of 25 to 30% with global hypokinesis and severely dilated LA. BNP 3,209 on 9/14. Now s/p RHC which demonstrated severe pulmonary hypertension. Cardiac MRI consistent with echo.  - Cardiology and advanced heart failure team following, appreciate recommendations - Euvolemic at this time - Stop lasix. Plan to start torsemide 40 daily today  - PICC line to monitor cardiac pressures - Cooxemetry monitoring  - Continue Imdur 60 and increased Hydral of 75 TID  - Continue reduced dose of Coreg 12.5 BID  - Cleared for GI scoping at this time - CTM renal function as above

## 2022-11-26 NOTE — Care Management Important Message (Signed)
Important Message  Patient Details  Name: Isaac Valdez MRN: [id number] Date of Birth: [birth year]   Medicare Important Message Given:  Yes     Sherilyn Banker 11/26/2022, 3:32 PM

## 2022-11-26 NOTE — Plan of Care (Signed)

## 2022-11-26 NOTE — Assessment & Plan Note (Addendum)
Patient has had multiple episodes of NSVT during admission, largely asympotmatic. 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 Most recent K 3.7, Mg 2.2 this morning - This morning, received Kcl 40 mEq  - AM BMP  - CTM, replete for K<4, Mg<2

## 2022-11-26 NOTE — H&P (View-Only) (Signed)
Patient ID: Isaac Valdez, male   DOB: September 09, 1960, 62 y.o.   MRN: [id number]    Progress Note   Subjective   Day # 7 CC; nausea, abdominal pain, poor oral intake, anemia and heme positive stool  Found to have acute congestive heart failure/cardiomyopathy with EF 25 to 30%, and on right heart cath also with severe pulmonary hypertension  He has been diuresing with IV Lasix  Says he feels a lot better today than he did on admission, his bloated sensation and abdominal discomfort has resolved, appetite has improved, denies shortness of breath currently and had been able to get up and walk in the hall with help earlier today.  Labs-WBC 5.4/hemoglobin 11.9/crit 36.8/platelets 249 Potassium 3.7/BUN 40/creatinine 1.42  Cardiology has cleared patient to undergo endoscopic evaluation    Objective   Vital signs in last 24 hours: Temp:  [98.4 F (36.9 C)-98.6 F (37 C)] 98.4 F (36.9 C) (09/19 1133) Pulse Rate:  [58-69] 66 (09/19 1133) Resp:  [18-19] 18 (09/19 1133) BP: (113-136)/(63-70) 113/63 (09/19 1133) SpO2:  [98 %-100 %] 98 % (09/19 1133) Weight:  [109.3 kg] 109.3 kg (09/19 0423) Last BM Date : 11/25/22 General:   [name] in NAD, wearing CPAP, denies shortness of breath Heart:  Regular rate and rhythm; no murmurs Lungs: Respirations even and unlabored, lungs CTA bilaterally Abdomen:  Soft, nontender and nondistended. Normal bowel sounds. Extremities:  Without edema. Right upper extremity Neurologic:  Alert and oriented,  grossly normal neurologically. Psych:  Cooperative. Normal mood and affect.  Intake/Output from previous day: 09/18 0701 - 09/19 0700 In: 40.9 [IV Piggyback:40.9] Out: 1325 [Urine:1325] Intake/Output this shift: Total I/O In: -  Out: 480 [Urine:480]  Lab Results: Recent Labs    11/24/22 0525 11/25/22 0510 11/26/22 0437  WBC 4.1 4.8 5.4  HGB 10.8* 11.6* 11.9*  HCT 33.2* 36.4* 36.8*  PLT 241 272 249   BMET Recent Labs     11/25/22 0510 11/25/22 2153 11/26/22 0437  NA 139 139 138  K 3.7 4.2 3.7  CL 104 101 104  CO2 24 25 25   GLUCOSE 99 103* 101*  BUN 40* 44* 40*  CREATININE 1.61* 1.65* 1.42*  CALCIUM 9.2 9.3 9.0   LFT No results for input(s): "PROT", "ALBUMIN", "AST", "ALT", "ALKPHOS", "BILITOT", "BILIDIR", "IBILI" in the last 72 hours. PT/INR No results for input(s): "LABPROT", "INR" in the last 72 hours.      Assessment / Plan:     #75 62 year old African-American male admitted with complaints of abdominal discomfort, poor oral intake nausea and found to have acute on chronic congestive heart failure EF 25 to 30%, moderate RV dysfunction severe LAE, moderate to severe mitral regurgitation Right heart cath consistent with severe pulmonary hypertension Etiology of the cardiomyopathy not entirely clear  He continues to be diuresed and clinically feels much better  #2 history of atrial flutter status post ablation #3.  Hypertension #4.  Sleep apnea with bipap #5 acute kidney injury-gradually improving  #6 anemia, heme positive stool-etiology not clear, was supposed to have GI workup as an outpatient with Isaac Valdez very recently, actually drank a bowel prep and was then told he could not do the procedure due to his cardiac issues.   Plan; start clear liquid diet Bowel prep this evening N.p.o. after midnight Plan is for EGD and colonoscopy with Dr. Tomasa Rand tomorrow. 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

## 2022-11-26 NOTE — Progress Notes (Signed)
Patient ID: Isaac Valdez, male   DOB: September 09, 1960, 62 y.o.   MRN: [id number]    Progress Note   Subjective   Day # 7 CC; nausea, abdominal pain, poor oral intake, anemia and heme positive stool  Found to have acute congestive heart failure/cardiomyopathy with EF 25 to 30%, and on right heart cath also with severe pulmonary hypertension  He has been diuresing with IV Lasix  Says he feels a lot better today than he did on admission, his bloated sensation and abdominal discomfort has resolved, appetite has improved, denies shortness of breath currently and had been able to get up and walk in the hall with help earlier today.  Labs-WBC 5.4/hemoglobin 11.9/crit 36.8/platelets 249 Potassium 3.7/BUN 40/creatinine 1.42  Cardiology has cleared patient to undergo endoscopic evaluation    Objective   Vital signs in last 24 hours: Temp:  [98.4 F (36.9 C)-98.6 F (37 C)] 98.4 F (36.9 C) (09/19 1133) Pulse Rate:  [58-69] 66 (09/19 1133) Resp:  [18-19] 18 (09/19 1133) BP: (113-136)/(63-70) 113/63 (09/19 1133) SpO2:  [98 %-100 %] 98 % (09/19 1133) Weight:  [109.3 kg] 109.3 kg (09/19 0423) Last BM Date : 11/25/22 General:   [name] in NAD, wearing CPAP, denies shortness of breath Heart:  Regular rate and rhythm; no murmurs Lungs: Respirations even and unlabored, lungs CTA bilaterally Abdomen:  Soft, nontender and nondistended. Normal bowel sounds. Extremities:  Without edema. Right upper extremity Neurologic:  Alert and oriented,  grossly normal neurologically. Psych:  Cooperative. Normal mood and affect.  Intake/Output from previous day: 09/18 0701 - 09/19 0700 In: 40.9 [IV Piggyback:40.9] Out: 1325 [Urine:1325] Intake/Output this shift: Total I/O In: -  Out: 480 [Urine:480]  Lab Results: Recent Labs    11/24/22 0525 11/25/22 0510 11/26/22 0437  WBC 4.1 4.8 5.4  HGB 10.8* 11.6* 11.9*  HCT 33.2* 36.4* 36.8*  PLT 241 272 249   BMET Recent Labs     11/25/22 0510 11/25/22 2153 11/26/22 0437  NA 139 139 138  K 3.7 4.2 3.7  CL 104 101 104  CO2 24 25 25   GLUCOSE 99 103* 101*  BUN 40* 44* 40*  CREATININE 1.61* 1.65* 1.42*  CALCIUM 9.2 9.3 9.0   LFT No results for input(s): "PROT", "ALBUMIN", "AST", "ALT", "ALKPHOS", "BILITOT", "BILIDIR", "IBILI" in the last 72 hours. PT/INR No results for input(s): "LABPROT", "INR" in the last 72 hours.      Assessment / Plan:     #75 62 year old African-American male admitted with complaints of abdominal discomfort, poor oral intake nausea and found to have acute on chronic congestive heart failure EF 25 to 30%, moderate RV dysfunction severe LAE, moderate to severe mitral regurgitation Right heart cath consistent with severe pulmonary hypertension Etiology of the cardiomyopathy not entirely clear  He continues to be diuresed and clinically feels much better  #2 history of atrial flutter status post ablation #3.  Hypertension #4.  Sleep apnea with bipap #5 acute kidney injury-gradually improving  #6 anemia, heme positive stool-etiology not clear, was supposed to have GI workup as an outpatient with Toma Copier very recently, actually drank a bowel prep and was then told he could not do the procedure due to his cardiac issues.   Plan; start clear liquid diet Bowel prep this evening N.p.o. after midnight Plan is for EGD and colonoscopy with Dr. Tomasa Rand tomorrow. 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

## 2022-11-26 NOTE — Assessment & Plan Note (Addendum)
Cardiology has cleared patient for further GI workup at this time. GI following, greatly appreciate their recs.

## 2022-11-26 NOTE — Progress Notes (Signed)
Occupational Therapy Treatment Patient Details Name: Isaac Valdez MRN: [id number] DOB: 10-15-1960 Today's Date: 11/26/2022   History of present illness 62 y.o. male presents to [facility] hospital on 11/19/2022 with abdominal pain, nausea and vomiting. CT and ultrasound abdomen unremarkable. Pt found with AKI and hypokalemia. PMH includes aflutter, HLD, HTN, CHF, DMII, GERD.   OT comments  OT instructed pt in and provided handout related to energy conservation strategies for increased safety and independence with functional tasks in the home. Pt verbalized and demonstrated understanding of education through teach back. Pt declined participation in cervical exercises this session secondary to significant pain and spasms in neck and R shoulder with pt also expressing frustration that the cause of the pain has not yet been identified and medication has not lowered pain this day. OT educated pt on potential benefits of ongoing participation in cervical HEP with pt reporting understanding but continuing to decline participation in exercises this day. Pt has met goal related to energy conservation strategies. Pt will benefit from continued acute skilled OT services to address deficits outlined below. Post acute discharge, no OT follow up is indicated at this time.       If plan is discharge home, recommend the following:  Assistance with cooking/housework;Assist for transportation   Equipment Recommendations  None recommended by OT    Recommendations for Other Services      Precautions / Restrictions Restrictions Weight Bearing Restrictions: No       Mobility Bed Mobility Overal bed mobility: Independent                  Transfers Overall transfer level: Independent Equipment used: None               General transfer comment: Pt Independently transfering sit/stand multiple times during session.     Balance Overall balance assessment: Independent                                          ADL either performed or assessed with clinical judgement   ADL Overall ADL's : Modified independent                                       General ADL Comments: OT educated pt in and provided a handout regarding energy conservation strategies to increase pt safety and independence with functional tasks in thehome. Pt verbalized understanding of all training and demonstrated understanding through teach back.    Extremity/Trunk Assessment Upper Extremity Assessment Upper Extremity Assessment: RUE deficits/detail;Overall [facility] for tasks assessed RUE Deficits / Details: Pt with spasms and pain in R shoulder which are chronic but which pt states have been worse over past few days. Strength, ROM, and coordination WFL.   Lower Extremity Assessment Lower Extremity Assessment: Defer to PT evaluation        Vision       Perception     Praxis      Cognition Arousal: Alert Behavior During Therapy: Restless, Agitated Overall Cognitive Status: Within Functional Limits for tasks assessed                                 General Comments: Pt appeared restless and agitated throughout session with pt moving and changing positions constantly  and expressing frustration that the root cause of his neck and R shoulder pain has not been identified and that the pain medication he has received while in the hospital is not lowering his pain level. OT offered active listening throughout session.        Exercises      Shoulder Instructions       General Comments VSS on RA throughout session. Pt declined participation in cervical exercises this day secondary to pain. Pt also expressed frustration that pain in his neck and R shoulder is continuing. Pt reports cervical exercises did not decrease pain after last OT session but also did not make pain worse. OT educated pt on potential benefits of ongoing participation in exercises over time with pt  reporting understanding but contiuing to decline participation in cervical exercises this day. Pt's wife present throughout session.    Pertinent Vitals/ Pain       Pain Assessment Pain Assessment: Faces Faces Pain Scale: Hurts whole lot Pain Location: R shoulder, neck Pain Descriptors / Indicators: Spasm, Grimacing, Guarding, Discomfort, Aching Pain Intervention(s): Limited activity within patient's tolerance, Monitored during session  Home Living                                          Prior Functioning/Environment              Frequency  Min 1X/week        Progress Toward Goals  OT Goals(current goals can now be found in the care plan section)  Progress towards OT goals: Progressing toward goals  Acute Rehab OT Goals Patient Stated Goal: to have less pain  Plan      Co-evaluation                 AM-PAC OT "6 Clicks" Daily Activity     Outcome Measure   Help from another person eating meals?: None Help from another person taking care of personal grooming?: None Help from another person toileting, which includes using toliet, bedpan, or urinal?: None Help from another person bathing (including washing, rinsing, drying)?: None Help from another person to put on and taking off regular upper body clothing?: None Help from another person to put on and taking off regular lower body clothing?: None 6 Click Score: 24    End of Session    OT Visit Diagnosis: Pain   Activity Tolerance Patient limited by pain   Patient Left in bed;with call bell/phone within reach;with family/visitor present   Nurse Communication Mobility status;Other (comment) (Pain)        Time: 4098-1191 OT Time Calculation (min): 17 min  Charges: OT General Charges $OT Visit: 1 Visit OT Treatments $Self Care/Home Management : 8-22 mins  Garwood Wentzell "Orson Eva., OTR/L, MA Acute Rehab [phone]   Lendon Colonel 11/26/2022, 5:23 PM

## 2022-11-26 NOTE — Assessment & Plan Note (Signed)
CHF (stage 3 or 4 DD, EF 55-60% on 07/2022 echo) - Cont home Coreg - Holding Entresto and torsemide given AKI

## 2022-11-26 NOTE — Plan of Care (Signed)

## 2022-11-26 NOTE — Assessment & Plan Note (Addendum)
Well-controlled. Has not required significant insulin dosing. CBG generally stable in the 90-100s.  - SSI - Hold home glipizide

## 2022-11-26 NOTE — Progress Notes (Addendum)
Daily Progress Note Intern Pager: [phone]  Patient name: Isaac Valdez Medical record number: [id number] Date of birth: 05/14/1960 Age: 62 y.o. Gender: male  Primary Care Provider: Jerrell Mylar, PA-C Consultants: Cardiology, Advanced HF, GI Code Status: Full  Pt Overview and Major Events to Date:  9/12: Admitted to FMTS 9/16: Right heart catheterization   Assessment and Plan: Isaac Valdez is a 62 y.o. male with pmh CHF, T2DM who presented with generalized abdominal pain, n/v/d and AKI with new reduced EF (25-30%) now s/p right heart cath as workup prior to EGD/colonoscopy. RHC demonstrated severe pulmonary hypertension. Patient responded well to diuresis, and advanced heart failure team has cleared patient for GI scoping at this time. GI team following.   Assessment & Plan AKI (acute kidney injury) (HCC) Most recent creatinine 1.42 this morning (prior baseline  ~1.2-1.4), likely improved with previous diuresis. Renal ultrasound unremarkable. - Avoid additional fluids  - Avoid NSAIDs and nephrotoxic drugs  - Pending Urine microalbumin creatinine ratio (has been collected) - Continue to hold Entresto at this time - Morning BMP, CBC, Mg Acute on chronic systolic and diastolic  (congestive) heart failure (HCC) CHF (stage 3 or 4 DD, EF 55-60% on 07/2022 echo). Inpatient 9/13 echo reflects worsened LVEF of 25 to 30% with global hypokinesis and severely dilated LA. BNP 3,209 on 9/14. Now s/p RHC which demonstrated severe pulmonary hypertension. Cardiac MRI consistent with echo.  - Cardiology and advanced heart failure team following, appreciate recommendations - Euvolemic at this time - Stop lasix. Plan to start torsemide 40 daily today  - PICC line to monitor cardiac pressures - Cooxemetry monitoring  - Continue Imdur 60 and increased Hydral of 75 TID  - Continue reduced dose of Coreg 12.5 BID  - Cleared for GI scoping at this time - CTM renal function as  above   Ventricular tachycardia, non-sustained (HCC) Patient has had multiple episodes of NSVT during admission, largely asympotmatic. 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 Most recent K 3.7, Mg 2.2 this morning - This morning, received Kcl 40 mEq  - AM BMP  - CTM, replete for K<4, Mg<2 Type 2 diabetes mellitus with other specified complication (HCC) Well-controlled. Has not required significant insulin dosing. CBG generally stable in the 90-100s.  - SSI - Hold home glipizide  Heme positive stool Cardiology has cleared patient for further GI workup at this time. GI following, greatly appreciate their recs. Gout Patient experiencing joint pain in his elbows that feels consistent to gout flare. - Start colchicine 0.6 mg daily - Avoid NSAIDs and nephrotoxic drugs - CTM symptoms   Chronic and Stable Issues: HTN  Aflutter: Continue reduced Coreg 12.5 mg BID, not anticoagulated due to low chadvasc score. HLD: Continue home Lipitor 20 Muscle Spasms: Continue home clonazepam 0.5 BID PRN, benadryl 25 BID OSA: Bipap at night    FEN/GI: Heart diet PPx: Lovenox Dispo: Home pending clinical improvement  Subjective:  Patient feeling unchanged this morning. No palpitations, chest pain, or difficulty breathing. He does note that he has had increased elbow pain, left worse than right, which feels like a gout flare up. He has been eating and drinking without issue. He had a bowel movement yesterday that was normal in appearance.   Objective: Temp:  [98.4 F (36.9 C)-98.6 F (37 C)] 98.4 F (36.9 C) (09/19 1133) Pulse Rate:  [58-69] 66 (09/19 1133) Resp:  [18-19] 18 (09/19 1133) BP: (113-136)/(63-70) 113/63 (09/19 1133) SpO2:  [98 %-100 %] 98 % (  09/19 1133) Weight:  [109.3 kg] 109.3 kg (09/19 0423) Physical Exam: General: Well-appearing, no acute distress, resting in chair Cardiovascular: Distant heart sounds. Soft systolic murmur. No S3/S4. Warm and well-perfused.  Respiratory:  Breathing comfortably on RA. Clear breath sounds heard bilaterally. No increased WOB. Abdomen: Soft. Mild distention, may be secondary to body habitus. Non-tender. BS present.  Extremities: No LE edema. No pain to palpation of LE. Elbows tender to palpation bilaterally. Bilateral arm extension limited by pain. No appreciable edema of elbow joints.   Laboratory: Most recent CBC Lab Results  Component Value Date   WBC 5.4 11/26/2022   HGB 11.9 (L) 11/26/2022   HCT 36.8 (L) 11/26/2022   MCV 93.9 11/26/2022   PLT 249 11/26/2022   Most recent BMP    Latest Ref Rng & Units 11/26/2022    4:37 AM  BMP  Glucose 70 - 99 mg/dL 956   BUN 6 - 20 mg/dL 40   Creatinine 3.87 - 1.24 mg/dL 5.64   Sodium 332 - 951 mmol/L 138   Potassium 3.5 - 5.1 mmol/L 3.7   Chloride 98 - 111 mmol/L 104   CO2 22 - 32 mmol/L 25   Calcium 8.9 - 10.3 mg/dL 9.0    Imaging/Diagnostic Tests: RHC (9/16): Severely elevated right heart catheterization findings with severe pulmonary hypertension with PA pressure 99/31 and mean pressure 51 mmHg. PW pressure 29 ,mm Hg, with V wave increased to 52 consistent with the patient's moderately severe mitral regurgitation    Westley Chandler, MD 11/26/2022, 3:26 PM  PGY-1, [facility] FPTS Intern pager: [phone], text pages welcome Secure chat group [facility] Teaching Service

## 2022-11-26 NOTE — Plan of Care (Signed)
  Problem: Metabolic: Goal: Ability to maintain appropriate glucose levels will improve Outcome: Progressing   Problem: Nutritional: Goal: Maintenance of adequate nutrition will improve Outcome: Progressing   Problem: Education: Goal: Knowledge of General Education information will improve Description: Including pain rating scale, medication(s)/side effects and non-pharmacologic comfort measures Outcome: Progressing   Problem: Elimination: Goal: Will not experience complications related to bowel motility Outcome: Progressing Goal: Will not experience complications related to urinary retention Outcome: Progressing   Problem: Health Behavior/Discharge Planning: Goal: Ability to manage health-related needs will improve Outcome: Not Progressing   Problem: Nutritional: Goal: Progress toward achieving an optimal weight will improve Outcome: Not Progressing   Problem: Clinical Measurements: Goal: Respiratory complications will improve Outcome: Not Progressing   Problem: Activity: Goal: Risk for activity intolerance will decrease Outcome: Not Progressing   Problem: Coping: Goal: Level of anxiety will decrease Outcome: Not Progressing   Problem: Pain Managment: Goal: General experience of comfort will improve Outcome: Not Progressing

## 2022-11-26 NOTE — Progress Notes (Addendum)
Advanced Heart Failure Rounding Note  PCP-Cardiologist: Nanetta Batty, MD   Subjective:    CMRI 1. Severely dilated left ventricle with mild concentric LV hypertrophy. EF 28%, diffuse hypokinesis.  2.  Moderately dilated right ventricle with RV EF 30%.  3. Non-coronary pattern LGE involving the basal RV insertion sites (nonspecific, can be seen with pressure/volume overload) and also a diffuse stripe of mid-wall LGE in the basal septal wall. This is also somewhat nonspecific and can be seen with dilated cardiomyopathies.   Yesterday diuretics stopped and hydralazine increased.   Complaining of elbow pain.     Objective:   Weight Range: 109.3 kg Body mass index is 33.61 kg/m.   Vital Signs:   Temp:  [98.4 F (36.9 C)-98.6 F (37 C)] 98.4 F (36.9 C) (09/19 1133) Pulse Rate:  [58-69] 66 (09/19 1133) Resp:  [18-19] 18 (09/19 1133) BP: (113-136)/(63-70) 113/63 (09/19 1133) SpO2:  [98 %-100 %] 98 % (09/19 1133) Weight:  [109.3 kg] 109.3 kg (09/19 0423) Last BM Date : 11/25/22  Weight change: Filed Weights   11/23/22 0500 11/25/22 0628 11/26/22 0423  Weight: 114.5 kg 107.5 kg 109.3 kg    Intake/Output:   Intake/Output Summary (Last 24 hours) at 11/26/2022 1521 Last data filed at 11/26/2022 1000 Gross per 24 hour  Intake --  Output 780 ml  Net -780 ml  CVP 1   Physical Exam  General:   No resp difficulty HEENT: normal Neck: supple. no JVD. Carotids 2+ bilat; no bruits. No lymphadenopathy or thryomegaly appreciated. Cor: PMI nondisplaced. Regular rate & rhythm. No rubs, gallops or murmurs. Lungs: clear Abdomen: soft, nontender, nondistended. No hepatosplenomegaly. No bruits or masses. Good bowel sounds. Extremities: no cyanosis, clubbing, rash, edema. LUE PICC  Neuro: alert & orientedx3, cranial nerves grossly intact. moves all 4 extremities w/o difficulty. Affect pleasant  Telemetry   SR 50-60s   EKG  No new EKG to review  Labs    CBC Recent  Labs    11/25/22 0510 11/26/22 0437  WBC 4.8 5.4  HGB 11.6* 11.9*  HCT 36.4* 36.8*  MCV 92.9 93.9  PLT 272 249   Basic Metabolic Panel Recent Labs    62/95/28 2153 11/26/22 0437  NA 139 138  K 4.2 3.7  CL 101 104  CO2 25 25  GLUCOSE 103* 101*  BUN 44* 40*  CREATININE 1.65* 1.42*  CALCIUM 9.3 9.0  MG 2.0 2.2   Liver Function Tests No results for input(s): "AST", "ALT", "ALKPHOS", "BILITOT", "PROT", "ALBUMIN" in the last 72 hours.  No results for input(s): "LIPASE", "AMYLASE" in the last 72 hours. Cardiac Enzymes No results for input(s): "CKTOTAL", "CKMB", "CKMBINDEX", "TROPONINI" in the last 72 hours.  BNP: BNP (last 3 results) Recent Labs    11/21/22 0220  BNP 3,209.1*    ProBNP (last 3 results) No results for input(s): "PROBNP" in the last 8760 hours.   D-Dimer No results for input(s): "DDIMER" in the last 72 hours. Hemoglobin A1C No results for input(s): "HGBA1C" in the last 72 hours. Fasting Lipid Panel No results for input(s): "CHOL", "HDL", "LDLCALC", "TRIG", "CHOLHDL", "LDLDIRECT" in the last 72 hours. Thyroid Function Tests No results for input(s): "TSH", "T4TOTAL", "T3FREE", "THYROIDAB" in the last 72 hours.  Invalid input(s): "FREET3"  Other results:   Imaging    No results found.   Medications:     Scheduled Medications:  carvedilol  12.5 mg Oral BID   Chlorhexidine Gluconate Cloth  6 each Topical Daily  colchicine  0.6 mg Oral Daily   dapagliflozin propanediol  10 mg Oral Daily   diphenhydrAMINE  25 mg Oral BID   enoxaparin (LOVENOX) injection  40 mg Subcutaneous Q24H   feeding supplement  237 mL Oral BID BM   hydrALAZINE  100 mg Oral TID   isosorbide mononitrate  60 mg Oral Daily   pantoprazole  40 mg Oral Daily   peg 3350 powder  0.5 kit Oral Once   And   peg 3350 powder  0.5 kit Oral Once   sodium chloride flush  10-40 mL Intracatheter Q12H   sodium chloride flush  3 mL Intravenous Q12H   spironolactone  25 mg Oral  Daily    Infusions:  sodium chloride      PRN Medications: sodium chloride, acetaminophen **OR** acetaminophen, clonazePAM, melatonin, ondansetron (ZOFRAN) IV, ondansetron, polyethylene glycol, senna, sodium chloride flush, sodium chloride flush    Patient Profile   Mr Limes is a 62 year old with a history of DMII, PAF, A flutter ablation, OSA, HTN, HLD, GERD, mitral regurgitation, movement disorder, gout, ETOH abuse, cocaine last used 2009, and new biventricular heart failure.   Assessment/Plan  1. Acute systolic CHF: Echo this admission with EF 25-30%, moderate RV dysfunction with moderate RV enlargement, severe LAE, dilated IVC, moderate-severe MR.  Recent cath in 6/24 with minimal CAD (though echo in 5/24 with EF 55-60%).  RHC with mean RA 18, PA 99/31 mean 51, mean PCWP 29, CI 2.8, PVR 3.4 WU.  Uncertain etiology of cardiomyopathy, significant drop in the last 4 months based on 5/24 and 9/24 echoes.  Think coronary disease is unlikely based on 6/24 cath.  He has a history of moderate-heavy ETOH but this has been chronic and would not cause acute drop.  Remote cocaine use. His son had a cardiomyopathy with sudden death in his 10s, so cannot rule out some form of familial cardiomyopathy.  Myocarditis remains a possibility.  Situation complicated by cardiorenal syndrome with creatinine 2 (Ibuprofen use may have played a role in this).  He was volume overloaded on exam and by RHC.  Now euvolemic. CVP remains low. Hold off on oral diuretics.   -CO-OX stable.  - With low CVP. Start 12.5 mg losartan daily.  - Continue hydralazine 100 mg three times mg tid + Imdur 60 daily.  - Continue coreg 12.5 mg twice a day.  Heart rate 50-60s. At home he was taking 25 mg coreg twice a day.  - cMRI -  NICM. LVEF 28%  RVEF 30%  - Needs to cut back on ETOH.  - Consider [facility] gene testing as outpatient.  2. Mitral regurgitation: Suspect functional MR with dilated annulus.  No more than moderate-severe  MR, not severe.  Mitraclip may be a consideration down the road.  3. Atrial flutter: S/p ablation, he has been in NSR here.  4. HTN: Better controlled.   5. OSA: On Bipap at home. 6. Pulmonary hypertension: Severe mixed PAH/PVH on RHC. OSA likely contributes.  V/Q scan was not suggestive of chronic PEs.  - Now diuresed.  7. AKI: Baseline creatinine appears to have been around 1.2.  Peaked at 2.  ?Cardiorenal, ibuprofen use likely contributes. - Stop NSAIDs.   - Creatinine 1.4 today.  - avoid hypotension - Repeat BMET in am.  8. Movement disorder: dystonia, followed at [facility].  9. Abdominal discomfort/nausea: CT abdomen/pelvis was unremarkable. LFTs normal.  Possible abdominal discomfort and nausea due to GI congestion from CHF.  GI is  following. Plan for scop tomorrow.  10. NSVT  11. Elbow Pain Suspect gout. Per primary team.     Length of Stay: 2  Tonye Becket, NP  11/26/2022, 3:21 PM  Advanced Heart Failure Team Pager [phone] (M-F; 7a - 5p)  Please contact [facility] for night-coverage after hours (5p -7a ) and weekends on [email or web address]  Patient seen with NP, agree with the above note.   CVP still low, < 5.  Co-ox 83%.  Creatinine down to 1.4.    General: NAD Neck: No JVD, no thyromegaly or thyroid nodule.  Lungs: Clear to auscultation bilaterally with normal respiratory effort. CV: Nondisplaced PMI.  Heart regular S1/S2, no S3/S4, no murmur.  No peripheral edema.   Abdomen: Soft, nontender, no hepatosplenomegaly, no distention.  Skin: Intact without lesions or rashes.  Neurologic: Alert and oriented x 3.  Psych: Normal affect. Extremities: No clubbing or cyanosis.  HEENT: Normal.   Euvolemic today, overall has diuresed well. CVP low. Cardiac MRI with mid-wall LGE in the basal septum, nonspecific versus possible prior myocarditis.  - Continue spironolactone to 25 mg daily. - Continue Farxiga 10 mg daily.  - Continue hydralazine 100 tid, continue Imdur 60 daily.  - Continue  current Coreg.  - Started losartan 12.5 daily today.  If creatinine continues to come down, switch to [facility].     Stable for GI procedures whenever GI wants to schedule => sounds like tomorrow.   Marca Ancona 11/26/2022 5:01 PM

## 2022-11-26 NOTE — Assessment & Plan Note (Addendum)
Most recent creatinine 1.42 this morning (prior baseline  ~1.2-1.4), likely improved with previous diuresis. Renal ultrasound unremarkable. - Avoid additional fluids  - Avoid NSAIDs and nephrotoxic drugs  - Pending Urine microalbumin creatinine ratio (has been collected) - Continue to hold Entresto at this time - Morning BMP, CBC, Mg

## 2022-11-27 ENCOUNTER — Encounter (HOSPITAL_COMMUNITY): Payer: Self-pay | Admitting: Family Medicine

## 2022-11-27 ENCOUNTER — Other Ambulatory Visit (HOSPITAL_COMMUNITY): Payer: Self-pay

## 2022-11-27 ENCOUNTER — Inpatient Hospital Stay (HOSPITAL_COMMUNITY): Payer: Medicare Other | Admitting: Registered Nurse

## 2022-11-27 ENCOUNTER — Encounter (HOSPITAL_COMMUNITY)
Admission: EM | Disposition: A | Discharge status: Home / Self Care | Payer: Self-pay | Source: Home / Self Care | Attending: Family Medicine

## 2022-11-27 DIAGNOSIS — I5043 Acute on chronic combined systolic (congestive) and diastolic (congestive) heart failure: Secondary | ICD-10-CM

## 2022-11-27 DIAGNOSIS — D123 Benign neoplasm of transverse colon: Secondary | ICD-10-CM

## 2022-11-27 DIAGNOSIS — I110 Hypertensive heart disease with heart failure: Secondary | ICD-10-CM

## 2022-11-27 DIAGNOSIS — I5023 Acute on chronic systolic (congestive) heart failure: Secondary | ICD-10-CM | POA: Diagnosis not present

## 2022-11-27 DIAGNOSIS — D120 Benign neoplasm of cecum: Secondary | ICD-10-CM

## 2022-11-27 DIAGNOSIS — D509 Iron deficiency anemia, unspecified: Secondary | ICD-10-CM | POA: Diagnosis not present

## 2022-11-27 DIAGNOSIS — K635 Polyp of colon: Secondary | ICD-10-CM

## 2022-11-27 DIAGNOSIS — R195 Other fecal abnormalities: Secondary | ICD-10-CM | POA: Diagnosis not present

## 2022-11-27 DIAGNOSIS — R109 Unspecified abdominal pain: Secondary | ICD-10-CM | POA: Diagnosis not present

## 2022-11-27 HISTORY — PX: POLYPECTOMY: SHX5525

## 2022-11-27 HISTORY — PX: BIOPSY: SHX5522

## 2022-11-27 HISTORY — PX: ESOPHAGOGASTRODUODENOSCOPY (EGD) WITH PROPOFOL: SHX5813

## 2022-11-27 HISTORY — PX: COLONOSCOPY WITH PROPOFOL: SHX5780

## 2022-11-27 LAB — BASIC METABOLIC PANEL
Anion gap: 12 (ref 5–15)
BUN: 37 mg/dL — ABNORMAL HIGH (ref 6–20)
CO2: 23 mmol/L (ref 22–32)
Calcium: 9.5 mg/dL (ref 8.9–10.3)
Chloride: 104 mmol/L (ref 98–111)
Creatinine, Ser: 1.37 mg/dL — ABNORMAL HIGH (ref 0.61–1.24)
GFR, Estimated: 59 mL/min — ABNORMAL LOW (ref >60)
Glucose, Bld: 93 mg/dL (ref 70–99)
Potassium: 4.5 mmol/L (ref 3.5–5.1)
Sodium: 139 mmol/L (ref 135–145)

## 2022-11-27 LAB — CBC
HCT: 38.9 % — ABNORMAL LOW (ref 39.0–52.0)
Hemoglobin: 12.4 g/dL — ABNORMAL LOW (ref 13.0–17.0)
MCH: 30.2 pg (ref 26.0–34.0)
MCHC: 31.9 g/dL (ref 30.0–36.0)
MCV: 94.6 fL (ref 80.0–100.0)
Platelets: 267 10*3/uL (ref 150–400)
RBC: 4.11 MIL/uL — ABNORMAL LOW (ref 4.22–5.81)
RDW: 17.2 % — ABNORMAL HIGH (ref 11.5–15.5)
WBC: 5 10*3/uL (ref 4.0–10.5)
nRBC: 0 % (ref 0.0–0.2)

## 2022-11-27 LAB — IRON AND TIBC
Iron: 56 ug/dL (ref 45–182)
Saturation Ratios: 14 % — ABNORMAL LOW (ref 17.9–39.5)
TIBC: 416 ug/dL (ref 250–450)
UIBC: 360 ug/dL

## 2022-11-27 LAB — COOXEMETRY PANEL
Carboxyhemoglobin: 1.6 % — ABNORMAL HIGH (ref 0.5–1.5)
Methemoglobin: <0.7 % (ref 0.0–1.5)
O2 Saturation: 80.1 %
Total hemoglobin: 13.1 g/dL (ref 12.0–16.0)

## 2022-11-27 LAB — FERRITIN: Ferritin: 66 ng/mL (ref 24–336)

## 2022-11-27 LAB — MAGNESIUM: Magnesium: 2.2 mg/dL (ref 1.7–2.4)

## 2022-11-27 SURGERY — ESOPHAGOGASTRODUODENOSCOPY (EGD) WITH PROPOFOL
Anesthesia: Monitor Anesthesia Care

## 2022-11-27 MED ORDER — ATORVASTATIN CALCIUM 10 MG PO TABS
20.0000 mg | ORAL_TABLET | Freq: Every day | ORAL | Status: DC
  Administered 2022-11-27 – 2022-11-28 (×2): 20 mg via ORAL
  Filled 2022-11-27 (×2): qty 2

## 2022-11-27 MED ORDER — PHENYLEPHRINE HCL (PRESSORS) 10 MG/ML IV SOLN
INTRAVENOUS | Status: DC | PRN
Start: 2022-11-27 — End: 2022-11-27
  Administered 2022-11-27 (×4): 120 ug via INTRAVENOUS

## 2022-11-27 MED ORDER — FERROUS SULFATE 325 (65 FE) MG PO TABS
325.0000 mg | ORAL_TABLET | ORAL | Status: DC
  Administered 2022-11-27: 325 mg via ORAL
  Filled 2022-11-27: qty 1

## 2022-11-27 MED ORDER — PROPOFOL 10 MG/ML IV BOLUS
INTRAVENOUS | Status: DC | PRN
Start: 2022-11-27 — End: 2022-11-27
  Administered 2022-11-27 (×2): 20 mg via INTRAVENOUS
  Administered 2022-11-27: 100 ug/kg/min via INTRAVENOUS
  Administered 2022-11-27: 40 mg via INTRAVENOUS
  Administered 2022-11-27 (×3): 20 mg via INTRAVENOUS

## 2022-11-27 MED ORDER — SACUBITRIL-VALSARTAN 24-26 MG PO TABS
1.0000 | ORAL_TABLET | Freq: Two times a day (BID) | ORAL | Status: DC
  Administered 2022-11-27 – 2022-11-28 (×2): 1 via ORAL
  Filled 2022-11-27 (×2): qty 1

## 2022-11-27 MED ORDER — LACTATED RINGERS IV SOLN: INTRAVENOUS | Status: DC

## 2022-11-27 MED ORDER — GLYCOPYRROLATE PF 0.2 MG/ML IJ SOSY
PREFILLED_SYRINGE | INTRAMUSCULAR | Status: DC | PRN
  Administered 2022-11-27: .1 mg via INTRAVENOUS

## 2022-11-27 MED ORDER — ENOXAPARIN SODIUM 40 MG/0.4ML IJ SOSY
40.0000 mg | PREFILLED_SYRINGE | INTRAMUSCULAR | Status: DC
  Administered 2022-11-27: 40 mg via SUBCUTANEOUS
  Filled 2022-11-27: qty 0.4

## 2022-11-27 SURGICAL SUPPLY — 15 items
BLOCK BITE 60FR ADLT L/F BLUE (MISCELLANEOUS) ×2 IMPLANT
ELECT REM PT RETURN 9FT ADLT (ELECTROSURGICAL) IMPLANT
ELECTRODE REM PT RTRN 9FT ADLT (ELECTROSURGICAL) IMPLANT
FORCEP RJ3 GP 1.8X160 W-NEEDLE (CUTTING FORCEPS) IMPLANT
FORCEPS BIOP RAD 4 LRG CAP 4 (CUTTING FORCEPS) IMPLANT
NDL SCLEROTHERAPY 25GX240 (NEEDLE) IMPLANT
NEEDLE SCLEROTHERAPY 25GX240 (NEEDLE) IMPLANT
PROBE APC STR FIRE (PROBE) IMPLANT
PROBE INJECTION GOLD (MISCELLANEOUS)
PROBE INJECTION GOLD 7FR (MISCELLANEOUS) IMPLANT
SNARE SHORT THROW 13M SML OVAL (MISCELLANEOUS) IMPLANT
SYR 50ML LL SCALE MARK (SYRINGE) IMPLANT
TUBING ENDO SMARTCAP PENTAX (MISCELLANEOUS) ×4 IMPLANT
TUBING IRRIGATION ENDOGATOR (MISCELLANEOUS) ×2 IMPLANT
WATER STERILE IRR 1000ML POUR (IV SOLUTION) IMPLANT

## 2022-11-27 NOTE — Assessment & Plan Note (Addendum)
Well-controlled. Has not required significant insulin dosing. CBG generally stable in the 90-100s. Elevated urine microalbumin 19.2 with Cr ratio 106. - SSI - Hold home glipizide

## 2022-11-27 NOTE — Assessment & Plan Note (Addendum)
Most recent creatinine 1.49 this morning (prior baseline  ~1.2-1.4), slight bump from 1.37 yesterday, could be 2/2 colchicine. Renal ultrasound was unremarkable. Elevated urine microalbumin 19.2 with Cr ratio 106. - Avoid additional fluids  - Caution with NSAIDs and nephrotoxic drugs  - Continue to hold Entresto at this time

## 2022-11-27 NOTE — Progress Notes (Signed)
PT Cancellation Note  Patient Details Name: Isaac Valdez MRN: [id number] DOB: 1960-04-11   Cancelled Treatment:    Reason Eval/Treat Not Completed: PT screened, no needs identified, will sign off - pt sleeping, pt's wife at bedside states pt is at mobility baseline and has no needs prior to d/c home. Of note, PT one-time evaled pt 1 week ago and signed off given pt independence. PT to sign off.  Marye Round, PT DPT Acute Rehabilitation Services Secure Chat Preferred  Office [phone]    Truddie Coco 11/27/2022, 11:16 AM

## 2022-11-27 NOTE — Op Note (Signed)
[facility] Patient Name: Isaac Valdez Procedure Date : 11/27/2022 MRN: [id number] Attending MD: Dub Amis. Tomasa Rand , MD, [id number] Date of Birth: 01-28-1961 CSN: [id number] Age: 62 Admit Type: Inpatient Procedure:                Colonoscopy Indications:              Heme positive stool, Iron deficiency anemia Providers:                Tiajuana Amass, MD, Doristine Mango, RN, Marja Kays, Technician Referring MD:              Medicines:                Monitored Anesthesia Care Complications:            No immediate complications. Estimated Blood Loss:     Estimated blood loss was minimal. Procedure:                Pre-Anesthesia Assessment:                           - Prior to the procedure, a History and Physical                            was performed, and patient medications and                            allergies were reviewed. The patient's tolerance of                            previous anesthesia was also reviewed. The risks                            and benefits of the procedure and the sedation                            options and risks were discussed with the patient.                            All questions were answered, and informed consent                            was obtained. Prior Anticoagulants: The patient has                            taken no anticoagulant or antiplatelet agents                            except for aspirin. ASA Grade Assessment: IV - A                            patient with severe systemic disease that is a  constant threat to life. After reviewing the risks                            and benefits, the patient was deemed in                            satisfactory condition to undergo the procedure.                           After obtaining informed consent, the colonoscope                            was passed under direct vision. Throughout the                             procedure, the patient's blood pressure, pulse, and                            oxygen saturations were monitored continuously. The                            CF-HQ190L ([id number]) Olympus coloscope was                            introduced through the anus and advanced to the the                            cecum, identified by appendiceal orifice and                            ileocecal valve. The colonoscopy was performed                            without difficulty. The patient tolerated the                            procedure well. The quality of the bowel                            preparation was good. The terminal ileum, ileocecal                            valve, appendiceal orifice, and rectum were                            photographed. The bowel preparation used was                            MoviPrep via split dose instruction. Scope In: Scope Out: Findings:      The perianal and digital rectal examinations were normal. Pertinent       negatives include normal sphincter tone and no palpable rectal lesions.      A 5 mm polyp was found in the cecum. The polyp was sessile. The polyp  was removed with a cold snare. Resection and retrieval were complete.       Estimated blood loss was minimal.      A 4 mm polyp was found in the splenic flexure. The polyp was sessile.       The polyp was removed with a cold snare. Resection and retrieval were       complete. Estimated blood loss was minimal.      The exam was otherwise normal throughout the examined colon.      The terminal ileum appeared normal.      Non-bleeding internal hemorrhoids were found during retroflexion. The       hemorrhoids were small.      No additional abnormalities were found on retroflexion. Impression:               - One 5 mm polyp in the cecum, removed with a cold                            snare. Resected and retrieved.                           - One 4 mm polyp at the splenic flexure,  removed                            with a cold snare. Resected and retrieved.                           - The examined portion of the ileum was normal.                           - Non-bleeding internal hemorrhoids.                           - Anemia more likely secondary to renal                            insufficiency. If anemia worsens and                            gastric/duodenal biopsies normal, can consider                            outpatient video capsule endoscopy Moderate Sedation:      N/A Recommendation:           - Return patient to hospital ward for ongoing care.                           - Resume regular diet.                           - Continue present medications.                           - Await pathology results.                           - Repeat colonoscopy (date not  yet determined) for                            surveillance based on pathology results.                           - GI will sign off at this time Procedure Code(s):        --- Professional ---                           [phone], Colonoscopy, flexible; with removal of                            tumor(s), polyp(s), or other lesion(s) by snare                            technique Diagnosis Code(s):        --- Professional ---                           D12.0, Benign neoplasm of cecum                           D12.3, Benign neoplasm of transverse colon (hepatic                            flexure or splenic flexure)                           K64.8, Other hemorrhoids                           R19.5, Other fecal abnormalities                           D50.9, Iron deficiency anemia, unspecified CPT copyright 2022 American Medical Association. All rights reserved. The codes documented in this report are preliminary and upon coder review may  be revised to meet current compliance requirements. Raechell Singleton E. Tomasa Rand, MD 11/27/2022 9:13:23 AM This report has been signed electronically. Number of Addenda: 0

## 2022-11-27 NOTE — Assessment & Plan Note (Deleted)
CHF (stage 3 or 4 DD, EF 55-60% on 07/2022 echo) - Cont home Coreg - Holding Entresto and torsemide given AKI

## 2022-11-27 NOTE — Progress Notes (Signed)
Daily Progress Note Intern Pager: [phone]  Patient name: Isaac Valdez Medical record number: [id number] Date of birth: Feb 17, 1961 Age: 62 y.o. Gender: male  Primary Care Provider: Jerrell Mylar, PA-C Consultants: Cardiology, Advanced HF, GI Code Status: Full   Pt Overview and Major Events to Date:  9/12: Admitted to FMTS 9/16: Right heart catheterization 9/20: Endoscopy and Colonoscopy   Assessment and Plan: Isaac Valdez is a 62 y.o. male with pmh CHF, T2DM who presented with generalized abdominal pain, n/v/d and AKI with new reduced EF (25-30%) now s/p right heart cath and EGD/colonoscopy. RHC demonstrated severe pulmonary hypertension. Patient responded well to diuresis, and advanced heart failure team cleared patient for GI scoping. Patient underwent endoscopy and colonoscopy this morning.  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 Inpatient 9/13 echo reflects worsened LVEF of 25 to 30% with global hypokinesis and severely dilated LA. BNP 3,209 on 9/14. Now s/p RHC which demonstrated severe pulmonary hypertension. Cardiac MRI consistent with echo.  - Cardiology and advanced heart failure team following, appreciate recommendations - Plan to start torsemide 40 daily today, will follow Cardiology's direction  - PICC line to monitor cardiac pressures - Cooxemetry monitoring  - Continue Imdur 60 and increased Hydral of 75 TID  - Continue reduced dose of Coreg 12.5 BID - CTM renal function as above - AM BMP   Ventricular tachycardia, non-sustained (HCC) Patient has had multiple episodes of NSVT during admission, largely asympotmatic. 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 Most recent K 4.5, Mg 2.2 this morning. One brief episode of Vtach (5-6 beats) noted on tele overnight.  - Continue cardiac monitoring - AM BMP, Mg - CTM, replete for K<4, Mg<2 AKI (acute  kidney injury) (HCC) Most recent creatinine 1.37 this morning (prior baseline  ~1.2-1.4), likely improved with previous diuresis. Renal ultrasound was unremarkable. Elevated urine microalbumin 19.2 with Cr ratio 106. - Avoid additional fluids  - Avoid NSAIDs and nephrotoxic drugs  - Continue to hold Entresto at this time - AM BMP, CBC, Mg Type 2 diabetes mellitus with other specified complication (HCC) Well-controlled. Has not required significant insulin dosing. CBG generally stable in the 90-100s. Elevated urine microalbumin 19.2 with Cr ratio 106. - SSI - Hold home glipizide Heme positive stool Patient underwent endoscopy and colonoscopy with GI today. Endoscopy significant for diffuse mildly congested mucosa without active bleeding in the duodenum. Colonoscopy significant for 2 polyps which were removed, otherwise unremarkable exam.  - Post-op check this afternoon - GI following, greatly appreciate their recommendations - Resume heart diet  - AM CBC, BMP Gout Patient experiencing joint pain in his elbows that feels consistent to gout flare. - Colchicine 0.6mg  daily - CTM  Iron deficiency anemia Iron 56, TIBC 416, Ferritin 66. 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  - Ferrous sulfate 325 every other day. Plan to continue outpatient.   Chronic and Stable Issues: HTN  Aflutter: Continue reduced Coreg 12.5 mg BID, not anticoagulated due to low chadvasc score. HLD: Continue home Lipitor 20 Muscle Spasms: Continue home clonazepam 0.5 BID PRN, benadryl 25 BID OSA: Bipap at night   FEN/GI: Resume heart diet PPx: Resume lovenox Dispo: Home pending clinical improvement/stability  Subjective:  Patient not seen at bedside this morning as patient was undergoing GI procedures at the time.   Objective: Temp:  [97 F (36.1 C)-98.4 F (36.9 C)]  97 F (36.1 C) (09/20 0806) Pulse Rate:  [63-76] 64 (09/20 0806) Resp:  [17-29] 29 (09/20 0806) BP: (113-149)/(57-90)  118/57 (09/20 0806) SpO2:  [95 %-100 %] 100 % (09/20 0806) Weight:  [109 kg-109.3 kg] 109.3 kg (09/20 0705) Physical Exam: Physical exam deferred this morning as patient was away undergoing GI procedures at the time.   Laboratory: Most recent CBC Lab Results  Component Value Date   WBC 5.0 11/27/2022   HGB 12.4 (L) 11/27/2022   HCT 38.9 (L) 11/27/2022   MCV 94.6 11/27/2022   PLT 267 11/27/2022   Most recent BMP    Latest Ref Rng & Units 11/27/2022    5:24 AM  BMP  Glucose 70 - 99 mg/dL 93   BUN 6 - 20 mg/dL 37   Creatinine 7.82 - 1.24 mg/dL 9.56   Sodium 213 - 086 mmol/L 139   Potassium 3.5 - 5.1 mmol/L 4.5   Chloride 98 - 111 mmol/L 104   CO2 22 - 32 mmol/L 23   Calcium 8.9 - 10.3 mg/dL 9.5    Lab Results  Component Value Date   IRON 56 11/27/2022   TIBC 416 11/27/2022   FERRITIN 66 11/27/2022    Imaging/Diagnostic Tests: Endoscopy: Diffuse mildly congested mucosa without active bleeding and with no stigmata of bleeding was found in the second portion of the duodenum. Otherwise unremarkable exam.  Colonoscopy: 5 mm polyp in cecum. 4 mm polyp in splenic fixture. Otherwise unremarkable exam.   [name], MD 11/27/2022, 8:10 AM  PGY-1, [facility] FPTS Intern pager: [phone], text pages welcome Secure chat group [facility] Teaching Service

## 2022-11-27 NOTE — Assessment & Plan Note (Addendum)
Patient underwent endoscopy and colonoscopy with GI today. Endoscopy significant for diffuse mildly congested mucosa without active bleeding in the duodenum. Colonoscopy significant for 2 polyps which were removed, otherwise unremarkable exam.  - Post-op check this afternoon - GI following, greatly appreciate their recommendations - Resume heart diet  - AM CBC, BMP

## 2022-11-27 NOTE — Assessment & Plan Note (Addendum)
Patient has had multiple episodes of NSVT during admission, largely asympotmatic. 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 Most recent K 4.5, Mg 2.2 this morning. One brief episode of Vtach (5-6 beats) noted on tele overnight.  - Continue cardiac monitoring - AM BMP, Mg - CTM, replete for K<4, Mg<2

## 2022-11-27 NOTE — Assessment & Plan Note (Signed)
CHF (stage 3 or 4 DD, EF 55-60% on 07/2022 echo). Inpatient 9/13 echo reflects worsened LVEF of 25 to 30% with global hypokinesis and severely dilated LA. BNP 3,209 on 9/14. Now s/p RHC which demonstrated severe pulmonary hypertension. Cardiac MRI consistent with echo.  - Cardiology and advanced heart failure team following, appreciate recommendations - Plan to start torsemide 40 daily today, will follow Cardiology's direction  - PICC line to monitor cardiac pressures - Cooxemetry monitoring  - Continue Imdur 60 and increased Hydral of 75 TID  - Continue reduced dose of Coreg 12.5 BID - CTM renal function as above - AM BMP

## 2022-11-27 NOTE — Anesthesia Postprocedure Evaluation (Signed)
Anesthesia Post Note  Patient: Isaac Valdez  Procedure(s) Performed: ESOPHAGOGASTRODUODENOSCOPY (EGD) WITH PROPOFOL COLONOSCOPY WITH PROPOFOL BIOPSY POLYPECTOMY     Patient location during evaluation: PACU Anesthesia Type: MAC Level of consciousness: awake and alert Pain management: pain level controlled Vital Signs Assessment: post-procedure vital signs reviewed and stable Respiratory status: spontaneous breathing, nonlabored ventilation, respiratory function stable and patient connected to nasal cannula oxygen Cardiovascular status: stable and blood pressure returned to baseline Postop Assessment: no apparent nausea or vomiting Anesthetic complications: no  There were no known notable events for this encounter.  Last Vitals:  Vitals:   11/27/22 0830 11/27/22 0851  BP: 138/74 128/79  Pulse: 60 65  Resp: 18 18  Temp:  36.4 C  SpO2: 100% 100%    Last Pain:  Vitals:   11/27/22 0851  TempSrc: Oral  PainSc: 0-No pain                 Tanay Misuraca S

## 2022-11-27 NOTE — Assessment & Plan Note (Signed)
Patient experiencing joint pain in his elbows that feels consistent to gout flare. - Colchicine 0.6mg  daily - CTM

## 2022-11-27 NOTE — Interval H&P Note (Signed)
History and Physical Interval Note:  11/27/2022 7:16 AM  Isaac Valdez  has presented today for surgery, with the diagnosis of Anemia, heme positive stool.  The various methods of treatment have been discussed with the patient and family. After consideration of risks, benefits and other options for treatment, the patient has consented to  Procedure(s): ESOPHAGOGASTRODUODENOSCOPY (EGD) WITH PROPOFOL (N/A) COLONOSCOPY WITH PROPOFOL (N/A) as a surgical intervention.  The patient's history has been reviewed, patient examined, no change in status, stable for surgery.  I have reviewed the patient's chart and labs.  Questions were answered to the patient's satisfaction.     Jenel Lucks

## 2022-11-27 NOTE — Assessment & Plan Note (Signed)
Most recent creatinine 1.49 this morning (prior baseline  ~1.2-1.4), slight bump from 1.37 yesterday, could be 2/2 colchicine. Renal ultrasound was unremarkable. Elevated urine microalbumin 19.2 with Cr ratio 106. - Avoid additional fluids  - Caution with NSAIDs and nephrotoxic drugs  - Continue to hold Entresto at this time

## 2022-11-27 NOTE — Assessment & Plan Note (Signed)
Patient underwent endoscopy and colonoscopy with GI yesterday. Endoscopy significant for diffuse mildly congested mucosa without active bleeding in the duodenum. Colonoscopy significant for 2 polyps which were removed, otherwise unremarkable exam.  - GI following, greatly appreciate their recommendations - Pathology sent, f/u outpatient

## 2022-11-27 NOTE — Assessment & Plan Note (Signed)
Iron 56, TIBC 416, Ferritin 66. 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  - Ferrous sulfate 325 every other day. Plan to continue outpatient.

## 2022-11-27 NOTE — Progress Notes (Addendum)
Advanced Heart Failure Rounding Note  PCP-Cardiologist: Nanetta Batty, MD   Subjective:    EGD today was normal other than congested duodenal mucosa (GI symptoms likely 2/2 CHF/ congestion)    Has diuresed 11 lb. Breathing much improved. Laying flat in bed when I arrived. No orthopnea. Denies CP.   SCr continue to trend down, 1.65>>1.37 today. Co-ox 80%. CVP set up removed when in endo for EGD.     CMRI 1. Severely dilated left ventricle with mild concentric LV hypertrophy. EF 28%, diffuse hypokinesis.  2.  Moderately dilated right ventricle with RV EF 30%.  3. Non-coronary pattern LGE involving the basal RV insertion sites (nonspecific, can be seen with pressure/volume overload) and also a diffuse stripe of mid-wall LGE in the basal septal wall. This is also somewhat nonspecific and can be seen with dilated cardiomyopathies.  Objective:   Weight Range: 109.3 kg Body mass index is 33.61 kg/m.   Vital Signs:   Temp:  [97 F (36.1 C)-98.4 F (36.9 C)] 97.7 F (36.5 C) (09/20 1130) Pulse Rate:  [60-76] 71 (09/20 1130) Resp:  [17-29] 20 (09/20 1130) BP: (118-149)/(57-90) 143/64 (09/20 1130) SpO2:  [95 %-100 %] 100 % (09/20 1130) Weight:  [109 kg-109.3 kg] 109.3 kg (09/20 0705) Last BM Date : 11/26/22  Weight change: Filed Weights   11/26/22 0423 11/27/22 0407 11/27/22 0705  Weight: 109.3 kg 109 kg 109.3 kg    Intake/Output:   Intake/Output Summary (Last 24 hours) at 11/27/2022 1137 Last data filed at 11/27/2022 0801 Gross per 24 hour  Intake 200 ml  Output 550 ml  Net -350 ml    Physical Exam   General:  Well appearing, mod obese. No respiratory difficulty HEENT: normal Neck: supple. no JVD. Carotids 2+ bilat; no bruits. No lymphadenopathy or thyromegaly appreciated. Cor: PMI nondisplaced. Regular rate & rhythm. No rubs, gallops or murmurs. Lungs: clear Abdomen: soft, nontender, nondistended. No hepatosplenomegaly. No bruits or masses. Good bowel  sounds. Extremities: no cyanosis, clubbing, rash, edema Neuro: alert & oriented x 3, cranial nerves grossly intact. moves all 4 extremities w/o difficulty. Affect pleasant.   Telemetry   NSR 60s   EKG  No new EKG to review  Labs    CBC Recent Labs    11/26/22 0437 11/27/22 0524  WBC 5.4 5.0  HGB 11.9* 12.4*  HCT 36.8* 38.9*  MCV 93.9 94.6  PLT 249 267   Basic Metabolic Panel Recent Labs    01/03/24 0437 11/27/22 0524  NA 138 139  K 3.7 4.5  CL 104 104  CO2 25 23  GLUCOSE 101* 93  BUN 40* 37*  CREATININE 1.42* 1.37*  CALCIUM 9.0 9.5  MG 2.2 2.2   Liver Function Tests No results for input(s): "AST", "ALT", "ALKPHOS", "BILITOT", "PROT", "ALBUMIN" in the last 72 hours.  No results for input(s): "LIPASE", "AMYLASE" in the last 72 hours. Cardiac Enzymes No results for input(s): "CKTOTAL", "CKMB", "CKMBINDEX", "TROPONINI" in the last 72 hours.  BNP: BNP (last 3 results) Recent Labs    11/21/22 0220  BNP 3,209.1*    ProBNP (last 3 results) No results for input(s): "PROBNP" in the last 8760 hours.   D-Dimer No results for input(s): "DDIMER" in the last 72 hours. Hemoglobin A1C No results for input(s): "HGBA1C" in the last 72 hours. Fasting Lipid Panel No results for input(s): "CHOL", "HDL", "LDLCALC", "TRIG", "CHOLHDL", "LDLDIRECT" in the last 72 hours. Thyroid Function Tests No results for input(s): "TSH", "T4TOTAL", "T3FREE", "THYROIDAB" in  the last 72 hours.  Invalid input(s): "FREET3"  Other results:   Imaging    No results found.   Medications:     Scheduled Medications:  atorvastatin  20 mg Oral Daily   carvedilol  12.5 mg Oral BID   Chlorhexidine Gluconate Cloth  6 each Topical Daily   colchicine  0.6 mg Oral Daily   dapagliflozin propanediol  10 mg Oral Daily   diphenhydrAMINE  25 mg Oral BID   enoxaparin (LOVENOX) injection  40 mg Subcutaneous Q24H   feeding supplement  237 mL Oral BID BM   ferrous sulfate  325 mg Oral QODAY    hydrALAZINE  100 mg Oral TID   isosorbide mononitrate  60 mg Oral Daily   losartan  12.5 mg Oral Daily   pantoprazole  40 mg Oral Daily   sodium chloride flush  10-40 mL Intracatheter Q12H   sodium chloride flush  3 mL Intravenous Q12H   spironolactone  25 mg Oral Daily    Infusions:  sodium chloride     lactated ringers 10 mL/hr at 11/27/22 9604    PRN Medications: sodium chloride, acetaminophen **OR** acetaminophen, clonazePAM, melatonin, ondansetron (ZOFRAN) IV, ondansetron, polyethylene glycol, senna, sodium chloride flush, sodium chloride flush    Patient Profile   Mr Cramer is a 62 year old with a history of DMII, PAF, A flutter ablation, OSA, HTN, HLD, GERD, mitral regurgitation, movement disorder, gout, ETOH abuse, cocaine last used 2009, and new biventricular heart failure.   Assessment/Plan  1. Acute systolic CHF: Echo this admission with EF 25-30%, moderate RV dysfunction with moderate RV enlargement, severe LAE, dilated IVC, moderate-severe MR.  Recent cath in 6/24 with minimal CAD (though echo in 5/24 with EF 55-60%).  RHC with mean RA 18, PA 99/31 mean 51, mean PCWP 29, CI 2.8, PVR 3.4 WU.  Uncertain etiology of cardiomyopathy, significant drop in the last 4 months based on 5/24 and 9/24 echoes.  Think coronary disease is unlikely based on 6/24 cath.  He has a history of moderate-heavy ETOH but this has been chronic and would not cause acute drop.  Remote cocaine use. His son had a cardiomyopathy with sudden death in his 7s, so cannot rule out some form of familial cardiomyopathy.  Myocarditis remains a possibility.  Situation complicated by cardiorenal syndrome with creatinine 2 (Ibuprofen use may have played a role in this).  He was volume overloaded on exam and by RHC. Diuresed well w/ IV Lasix. Now euvolemic. CO-OX stable, 80%. No longer w/ CVP set up (removed while getting EGD) but volume still stable on exam. BPs mildly elevated.  - Stop Losartan. Transition to  Entresto 24-26 mg bid.  - Continue Farxiga 10 mg daily - Continue Spiro 25 mg daily  - Continue hydralazine 100 mg three times mg tid + Imdur 60 daily.  - Continue coreg 12.5 mg twice a day.  Heart rate 50-60s. At home he was taking 25 mg coreg twice a day.  - Start PO torsemide 20 mg daily  - cMRI -  NICM. LVEF 28%  RVEF 30%, nonspecific mid-wall basal septal LGE.  - Needs to cut back on ETOH.  - Consider [facility] gene testing as outpatient.  2. Mitral regurgitation: Suspect functional MR with dilated annulus.  No more than moderate-severe MR, not severe.  Mitraclip may be a consideration down the road.  3. Atrial flutter: S/p ablation, he has been in NSR here.  4. HTN: Better controlled.   5. OSA: On  Bipap at home. 6. Pulmonary hypertension: Severe mixed PAH/PVH on RHC. OSA likely contributes.  V/Q scan was not suggestive of chronic PEs.  - Now diuresed.  7. AKI: Baseline creatinine appears to have been around 1.2.  Peaked at 2.  ?Cardiorenal, ibuprofen use likely contributes. - Stop NSAIDs.   - Creatinine improved 1.4 today.  - avoid hypotension 8. Movement disorder: dystonia, followed at [facility].  9. Abdominal discomfort/nausea: CT abdomen/pelvis was unremarkable. LFTs normal.  EGD/colonoscopy today normal other than congested duodenal mucosa. Suspect abdominal discomfort and nausea due to GI congestion from CHF.   - continue diuretics  10. NSVT  11. Elbow Pain - Suspect gout. Per primary team.     Length of Stay: [address] Delmer Islam  11/27/2022, 11:37 AM  Advanced Heart Failure Team Pager [phone] (M-F; 7a - 5p)  Please contact [facility] for night-coverage after hours (5p -7a ) and weekends on [email or web address]  Patient seen with PA, agree with the above note.   Had EGD and colonoscopy today.  Unremarkable other than congested mucosa likely due to CHF/gut congestion.   Creatinine lower at 1.37, BP stable.  No dyspnea.   He had a run of NSVT this afternoon.   General:  NAD Neck: No JVD, no thyromegaly or thyroid nodule.  Lungs: Clear to auscultation bilaterally with normal respiratory effort. CV: Nondisplaced PMI.  Heart regular S1/S2, no S3/S4, no murmur.  No peripheral edema.  Abdomen: Soft, nontender, no hepatosplenomegaly, no distention.  Skin: Intact without lesions or rashes.  Neurologic: Alert and oriented x 3.  Psych: Normal affect. Extremities: No clubbing or cyanosis.  HEENT: Normal.   Euvolemic today, overall has diuresed well.  Cardiac MRI with mid-wall LGE in the basal septum, nonspecific versus possible prior myocarditis.  - Stop losartan, start Entresto 24/26 bid.  - Continue spironolactone to 25 mg daily. - Continue Farxiga 10 mg daily.  - Continue hydralazine 100 tid, continue Imdur 60 daily.  - Continue current Coreg 12.5 mg bid.   Patient had run NSVT today, felt some palpitations but only about 15 beats.  Continue Coreg.   Watch overnight, think he can go home in the morning if remains stable.   Marca Ancona 11/27/2022 1:48 PM

## 2022-11-27 NOTE — Transfer of Care (Signed)
Immediate Anesthesia Transfer of Care Note  Patient: Isaac Valdez  Procedure(s) Performed: ESOPHAGOGASTRODUODENOSCOPY (EGD) WITH PROPOFOL COLONOSCOPY WITH PROPOFOL BIOPSY POLYPECTOMY  Patient Location: PACU  Anesthesia Type:MAC  Level of Consciousness: drowsy and patient cooperative  Airway & Oxygen Therapy: Patient Spontanous Breathing  Post-op Assessment: Report given to RN and Post -op Vital signs reviewed and stable  Post vital signs: Reviewed and stable  Last Vitals:  Vitals Value Taken Time  BP 118/57 11/27/22 0805  Temp    Pulse 60 11/27/22 0806  Resp 29 11/27/22 0806  SpO2 100 % 11/27/22 0806  Vitals shown include unfiled device data.  Last Pain:  Vitals:   11/27/22 0705  TempSrc:   PainSc: 0-No pain         Complications: There were no known notable events for this encounter.

## 2022-11-27 NOTE — Anesthesia Preprocedure Evaluation (Addendum)
Anesthesia Evaluation  Patient identified by MRN, date of birth, ID band Patient awake    Reviewed: Allergy & Precautions, H&P , NPO status , Patient's Chart, lab work & pertinent test results  Airway Mallampati: II  TM Distance: >3 FB Neck ROM: Full    Dental  (+) Missing, Poor Dentition, Chipped   Pulmonary sleep apnea , Current Smoker   Pulmonary exam normal breath sounds clear to auscultation       Cardiovascular hypertension, Pt. on medications +CHF  + dysrhythmias Atrial Fibrillation + Valvular Problems/Murmurs MR  Rhythm:Regular Rate:Normal + Systolic murmurs Left Ventricle: Left ventricular ejection fraction, by estimation, is 25  to 30%. The left ventricle has severely decreased function. The left  ventricle demonstrates global hypokinesis. The left ventricular internal  cavity size was mildly to moderately  dilated. There is no left ventricular hypertrophy. Left ventricular  diastolic parameters are indeterminate.   Right Ventricle: The right ventricular size is severely enlarged. No  increase in right ventricular wall thickness. Right ventricular systolic  function is moderately reduced. There is severely elevated pulmonary  artery systolic pressure. The tricuspid  regurgitant velocity is 3.62 m/s, and with an assumed right atrial  pressure of 15 mmHg, the estimated right ventricular systolic pressure is  67.4 mmHg.   Left Atrium: Left atrial size was severely dilated.   Right Atrium: Right atrial size was normal in size.   Pericardium: Trivial pericardial effusion is present. The pericardial  effusion is circumferential.   Mitral Valve: The mitral valve is abnormal. Moderate to severe mitral  valve regurgitation.   Tricuspid Valve: Eccentric. The tricuspid valve is normal in structure.  Tricuspid valve regurgitation is mild to moderate.   Aortic Valve: The aortic valve is tricuspid. There is mild  calcification  of the aortic valve. Aortic valve regurgitation is mild. Aortic  regurgitation PHT measures 559 msec. Aortic valve mean gradient measures  5.0 mmHg. Aortic valve peak gradient  measures 7.5 mmHg. Aortic valve area, by VTI measures 2.19 cm.      Neuro/Psych negative neurological ROS  negative psych ROS   GI/Hepatic negative GI ROS, Neg liver ROS,,,  Endo/Other  diabetes, Type 2    Renal/GU negative Renal ROS  negative genitourinary   Musculoskeletal negative musculoskeletal ROS (+)    Abdominal   Peds negative pediatric ROS (+)  Hematology negative hematology ROS (+)   Anesthesia Other Findings   Reproductive/Obstetrics negative OB ROS                             Anesthesia Physical Anesthesia Plan  ASA: 4  Anesthesia Plan: MAC   Post-op Pain Management: Minimal or no pain anticipated   Induction: Intravenous  PONV Risk Score and Plan: 1 and Propofol infusion and Treatment may vary due to age or medical condition  Airway Management Planned: Nasal Cannula  Additional Equipment:   Intra-op Plan:   Post-operative Plan:   Informed Consent: I have reviewed the patients History and Physical, chart, labs and discussed the procedure including the risks, benefits and alternatives for the proposed anesthesia with the patient or authorized representative who has indicated his/her understanding and acceptance.     Dental advisory given  Plan Discussed with: CRNA and Surgeon  Anesthesia Plan Comments:        Anesthesia Quick Evaluation

## 2022-11-27 NOTE — Plan of Care (Signed)

## 2022-11-27 NOTE — Assessment & Plan Note (Signed)
Well-controlled. Has not required significant insulin dosing. CBG generally stable in the 90-100s. Elevated urine microalbumin 19.2 with Cr ratio 106. - SSI - Hold home glipizide

## 2022-11-27 NOTE — Op Note (Addendum)
[facility] Patient Name: Isaac Valdez Procedure Date : 11/27/2022 MRN: [id number] Attending MD: Dub Amis. Tomasa Rand , MD, [id number] Date of Birth: Jan 11, 1961 CSN: [id number] Age: 62 Admit Type: Inpatient Procedure:                Upper GI endoscopy Indications:              Generalized abdominal pain (resolved), Iron                            deficiency anemia (mild) Providers:                Dub Amis. Tomasa Rand, MD, Doristine Mango, RN,                            Marja Kays, Technician Referring MD:              Medicines:                Monitored Anesthesia Care Complications:            No immediate complications. Estimated Blood Loss:     Estimated blood loss was minimal. Procedure:                Pre-Anesthesia Assessment:                           - Prior to the procedure, a History and Physical                            was performed, and patient medications and                            allergies were reviewed. The patient's tolerance of                            previous anesthesia was also reviewed. The risks                            and benefits of the procedure and the sedation                            options and risks were discussed with the patient.                            All questions were answered, and informed consent                            was obtained. Prior Anticoagulants: The patient has                            taken no anticoagulant or antiplatelet agents                            except for aspirin. ASA Grade Assessment: IV - A  patient with severe systemic disease that is a                            constant threat to life. After reviewing the risks                            and benefits, the patient was deemed in                            satisfactory condition to undergo the procedure.                           After obtaining informed consent, the endoscope was                             passed under direct vision. Throughout the                            procedure, the patient's blood pressure, pulse, and                            oxygen saturations were monitored continuously. The                            [id number]-[id number] ([id number] Olympus endoscope was introduced                            through the mouth, and advanced to the second part                            of duodenum. The upper GI endoscopy was                            accomplished without difficulty. The patient                            tolerated the procedure well. Scope In: 7:45:14 AM Scope Out: 8:00:07 AM Scope Withdrawal Time: 0 hours 11 minutes 14 seconds  Total Procedure Duration: 0 hours 14 minutes 53 seconds  Findings:      The examined esophagus was normal.      The entire examined stomach was normal. Biopsies were taken with a cold       forceps for Helicobacter pylori testing. Estimated blood loss was       minimal.      Diffuse mildly congested mucosa without active bleeding and with no       stigmata of bleeding was found in the second portion of the duodenum.       Biopsies for histology were taken with a cold forceps for evaluation of       celiac disease. Estimated blood loss was minimal.      The exam of the duodenum was otherwise normal. Impression:               - Normal esophagus.                           -  Normal stomach. Biopsied.                           - Congested duodenal mucosa. Biopsied.                           - Suspect patient's abdominal pain was secondary to                            congestive heart failure Moderate Sedation:      N/A Recommendation:           - Patient has a contact number available for                            emergencies. The signs and symptoms of potential                            delayed complications were discussed with the                            patient. Return to normal activities tomorrow.                            Written  discharge instructions were provided to the                            patient.                           - Resume previous diet.                           - Continue present medications.                           - Await pathology results. Procedure Code(s):        --- Professional ---                           [phone], Esophagogastroduodenoscopy, flexible,                            transoral; with biopsy, single or multiple Diagnosis Code(s):        --- Professional ---                           K31.89, Other diseases of stomach and duodenum                           R10.84, Generalized abdominal pain                           D50.9, Iron deficiency anemia, unspecified CPT copyright 2022 [employer]. All rights reserved. The codes documented in this report are preliminary and upon coder review may  be revised to meet current compliance requirements. Akeel Reffner E. Tomasa Rand, MD 11/27/2022 8:11:44 AM This report has been signed  electronically. Number of Addenda: 0

## 2022-11-27 NOTE — Progress Notes (Addendum)
Notified by CCMD that patient had a 25 beat run of NSVT. Patient stated he felt his heart racing when it happened, but that now he feels fine. BP 128/62. Dr. Shirlee Latch was rounding on floor at the time and was able to view telemetry strip.

## 2022-11-27 NOTE — Assessment & Plan Note (Addendum)
Patient experiencing joint pain in his elbows that feels consistent to gout flare. - Colchicine 0.6mg  daily - CTM

## 2022-11-27 NOTE — Assessment & Plan Note (Addendum)
CHF (stage 3 or 4 DD, EF 55-60% on 07/2022 echo). Inpatient 9/13 echo reflects worsened LVEF of 25 to 30% with global hypokinesis and severely dilated LA. BNP 3,209 on 9/14. Now s/p RHC which demonstrated severe pulmonary hypertension. Cardiac MRI consistent with echo.  - Cardiology and advanced heart failure team following, appreciate recommendations - Plan to start torsemide 40 daily today, will follow Cardiology's direction  - PICC line to monitor cardiac pressures - Cooxemetry monitoring  - Continue Imdur 60 and increased Hydral of 75 TID  - Continue reduced dose of Coreg 12.5 BID - CTM renal function as above - AM BMP

## 2022-11-27 NOTE — Assessment & Plan Note (Signed)
CHF (stage 3 or 4 DD, EF 55-60% on 07/2022 echo) - Cont home Coreg - Holding Entresto and torsemide given AKI

## 2022-11-27 NOTE — Assessment & Plan Note (Signed)
Patient has had multiple episodes of NSVT during admission, largely asympotmatic. 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 Most recent K 4.5, Mg 2.2 this morning. One brief episode of Vtach (5-6 beats) noted on tele overnight.  - Continue cardiac monitoring - AM BMP, Mg - CTM, replete for K<4, Mg<2

## 2022-11-27 NOTE — Progress Notes (Incomplete)
Daily Progress Note Intern Pager: [phone]  Patient name: Isaac Valdez Medical record number: [id number] Date of birth: May 04, 1960 Age: 62 y.o. Gender: male  Primary Care Provider: Jerrell Mylar, PA-C Consultants: *** Code Status: ***  Pt Overview and Major Events to Date:  ***  Assessment and Plan:  ***. Pertinent PMH/PSH includes ***.  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 Inpatient 9/13 echo reflects worsened LVEF of 25 to 30% with global hypokinesis and severely dilated LA. BNP 3,209 on 9/14. Now s/p RHC which demonstrated severe pulmonary hypertension. Cardiac MRI consistent with echo.  - Cardiology and advanced heart failure team following, appreciate recommendations - Plan to start torsemide 40 daily today, will follow Cardiology's direction  - PICC line to monitor cardiac pressures - Cooxemetry monitoring  - Continue Imdur 60 and increased Hydral of 75 TID  - Continue reduced dose of Coreg 12.5 BID - CTM renal function as above - AM BMP   Ventricular tachycardia, non-sustained (HCC) Patient has had multiple episodes of NSVT during admission, largely asympotmatic. 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 Most recent K 4.5, Mg 2.2 this morning. One brief episode of Vtach (5-6 beats) noted on tele overnight.  - Continue cardiac monitoring - AM BMP, Mg - CTM, replete for K<4, Mg<2 AKI (acute kidney injury) (HCC) Most recent creatinine 1.37 this morning (prior baseline  ~1.2-1.4), likely improved with previous diuresis. Renal ultrasound was unremarkable. Elevated urine microalbumin 19.2 with Cr ratio 106. - Avoid additional fluids  - Avoid NSAIDs and nephrotoxic drugs  - Continue to hold Entresto at this time - AM BMP, CBC, Mg Type 2 diabetes mellitus with other specified complication (HCC) Well-controlled. Has not required significant insulin dosing.  CBG generally stable in the 90-100s. Elevated urine microalbumin 19.2 with Cr ratio 106. - SSI - Hold home glipizide Heme positive stool Patient underwent endoscopy and colonoscopy with GI today. Endoscopy significant for diffuse mildly congested mucosa without active bleeding in the duodenum. Colonoscopy significant for 2 polyps which were removed, otherwise unremarkable exam.  - Post-op check this afternoon - GI following, greatly appreciate their recommendations - Resume heart diet  - AM CBC, BMP Gout Patient experiencing joint pain in his elbows that feels consistent to gout flare. - Colchicine 0.6mg  daily - CTM  Iron deficiency anemia Iron 56, TIBC 416, Ferritin 66. 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  - Ferrous sulfate 325 every other day. Plan to continue outpatient.  Acute HFrEF (heart failure with reduced ejection fraction) (HCC)  Unintentional weight loss  Chronic diarrhea  Chronic diastolic congestive heart failure (HCC) CHF (stage 3 or 4 DD, EF 55-60% on 07/2022 echo) - Cont home Coreg - Holding Entresto and torsemide given AKI  Chronic and Stable Problems: ***   FEN/GI: *** PPx: *** Dispo:{FPTSDISOLIST:27587} {FPTSDISOTIME:27588}. Barriers include ***.   Subjective:  ***  Objective: Temp:  [97 F (36.1 C)-98 F (36.7 C)] 98 F (36.7 C) (09/20 2005) Pulse Rate:  [60-71] 71 (09/20 2005) Resp:  [18-29] 19 (09/20 2005) BP: (118-149)/(57-90) 122/76 (09/20 2005) SpO2:  [95 %-100 %] 95 % (09/20 2005) Weight:  [109 kg-109.3 kg] 109.3 kg (09/20 0705) Physical Exam: General: *** Cardiovascular: *** Respiratory: *** Abdomen: *** Extremities: ***  Laboratory: Most recent CBC Lab Results  Component Value Date   WBC 5.0 11/27/2022   HGB 12.4 (L) 11/27/2022  HCT 38.9 (L) 11/27/2022   MCV 94.6 11/27/2022   PLT 267 11/27/2022   Most recent BMP    Latest Ref Rng & Units 11/27/2022    5:24 AM  BMP  Glucose 70 - 99 mg/dL 93    BUN 6 - 20 mg/dL 37   Creatinine 1.61 - 1.24 mg/dL 0.96   Sodium 045 - 409 mmol/L 139   Potassium 3.5 - 5.1 mmol/L 4.5   Chloride 98 - 111 mmol/L 104   CO2 22 - 32 mmol/L 23   Calcium 8.9 - 10.3 mg/dL 9.5     Other pertinent labs ***   Imaging/Diagnostic Tests: Radiologist Impression: *** My interpretation: Para March, DO 11/27/2022, 10:08 PM  PGY-***, [facility] Family Medicine FPTS Intern pager: [phone], text pages welcome Secure chat group [facility] Teaching Service

## 2022-11-27 NOTE — TOC Benefit Eligibility Note (Signed)
Patient Product/process development scientist completed.    The patient is insured through [facility]. Patient has Medicare and is not eligible for a copay card, but may be able to apply for patient assistance, if available.    Ran test claim for Farxiga 10 mg and the current 30 day co-pay is $47.00.   This test claim was processed through [facility]- copay amounts may vary at other pharmacies due to pharmacy/plan contracts, or as the patient moves through the different stages of their insurance plan.     Roland Earl, CPHT Pharmacy Technician III Certified Patient Advocate [facility] Patient Advocate Team Direct Number: [phone]  Fax: [phone]

## 2022-11-28 DIAGNOSIS — I5023 Acute on chronic systolic (congestive) heart failure: Secondary | ICD-10-CM | POA: Diagnosis not present

## 2022-11-28 LAB — COOXEMETRY PANEL
Carboxyhemoglobin: 1.8 % — ABNORMAL HIGH (ref 0.5–1.5)
Methemoglobin: <0.7 % (ref 0.0–1.5)
O2 Saturation: 65.8 %
Total hemoglobin: 12.8 g/dL (ref 12.0–16.0)

## 2022-11-28 LAB — BASIC METABOLIC PANEL
Anion gap: 7 (ref 5–15)
BUN: 34 mg/dL — ABNORMAL HIGH (ref 6–20)
CO2: 22 mmol/L (ref 22–32)
Calcium: 9.1 mg/dL (ref 8.9–10.3)
Chloride: 107 mmol/L (ref 98–111)
Creatinine, Ser: 1.49 mg/dL — ABNORMAL HIGH (ref 0.61–1.24)
GFR, Estimated: 53 mL/min — ABNORMAL LOW (ref >60)
Glucose, Bld: 98 mg/dL (ref 70–99)
Potassium: 4.3 mmol/L (ref 3.5–5.1)
Sodium: 136 mmol/L (ref 135–145)

## 2022-11-28 LAB — CBC
HCT: 37.9 % — ABNORMAL LOW (ref 39.0–52.0)
Hemoglobin: 12 g/dL — ABNORMAL LOW (ref 13.0–17.0)
MCH: 29.6 pg (ref 26.0–34.0)
MCHC: 31.7 g/dL (ref 30.0–36.0)
MCV: 93.3 fL (ref 80.0–100.0)
Platelets: 255 10*3/uL (ref 150–400)
RBC: 4.06 MIL/uL — ABNORMAL LOW (ref 4.22–5.81)
RDW: 17.2 % — ABNORMAL HIGH (ref 11.5–15.5)
WBC: 4.8 10*3/uL (ref 4.0–10.5)
nRBC: 0 % (ref 0.0–0.2)

## 2022-11-28 LAB — MAGNESIUM: Magnesium: 2.4 mg/dL (ref 1.7–2.4)

## 2022-11-28 MED ORDER — ISOSORBIDE MONONITRATE ER 60 MG PO TB24: 60.0000 mg | ORAL_TABLET | Freq: Every day | ORAL | 0 refills | Status: DC

## 2022-11-28 MED ORDER — PANTOPRAZOLE SODIUM 40 MG PO TBEC: 40.0000 mg | DELAYED_RELEASE_TABLET | Freq: Every day | ORAL | 0 refills | Status: AC

## 2022-11-28 MED ORDER — TORSEMIDE 20 MG PO TABS: 20.0000 mg | ORAL_TABLET | Freq: Every day | ORAL | 0 refills | Status: DC

## 2022-11-28 MED ORDER — SENNA 8.6 MG PO TABS: 1.0000 | ORAL_TABLET | Freq: Every day | ORAL | Status: DC | PRN

## 2022-11-28 MED ORDER — HYDRALAZINE HCL 100 MG PO TABS: 100.0000 mg | ORAL_TABLET | Freq: Three times a day (TID) | ORAL | 0 refills | Status: DC

## 2022-11-28 MED ORDER — POLYETHYLENE GLYCOL 3350 17 G PO PACK: 17.0000 g | PACK | Freq: Every day | ORAL | Status: AC | PRN

## 2022-11-28 MED ORDER — FERROUS SULFATE 325 (65 FE) MG PO TABS: 325.0000 mg | ORAL_TABLET | ORAL | 0 refills | Status: DC

## 2022-11-28 MED ORDER — CLONAZEPAM 0.5 MG PO TABS: 0.5000 mg | ORAL_TABLET | Freq: Two times a day (BID) | ORAL | Status: AC | PRN

## 2022-11-28 MED ORDER — CARVEDILOL 12.5 MG PO TABS: 12.5000 mg | ORAL_TABLET | Freq: Two times a day (BID) | ORAL | 0 refills | Status: AC

## 2022-11-28 MED ORDER — SPIRONOLACTONE 25 MG PO TABS: 25.0000 mg | ORAL_TABLET | Freq: Every day | ORAL | 0 refills | Status: DC

## 2022-11-28 MED ORDER — DAPAGLIFLOZIN PROPANEDIOL 10 MG PO TABS: 10.0000 mg | ORAL_TABLET | Freq: Every day | ORAL | 0 refills | Status: DC

## 2022-11-28 MED ORDER — DICLOFENAC SODIUM 1 % EX GEL: 2.0000 g | Freq: Four times a day (QID) | CUTANEOUS | 0 refills | Status: DC

## 2022-11-28 MED ORDER — ENSURE ENLIVE PO LIQD: 237.0000 mL | Freq: Two times a day (BID) | ORAL | Status: DC

## 2022-11-28 NOTE — Discharge Instructions (Addendum)
WEIGH DAILY, every am, wearing the same amount of clothing Record weights, and bring it to office appointments Contact Marca Ancona, MD for weight gain of 3 lbs in a day or 5 lbs in a week Limit sodium to 500 mg per meal, total 2000 mg per day Limit all liquids to 1.5-2 liters/quarts per day   Dear [name],   Thank you so much for allowing [place] to be part of your care!  You were admitted to [facility] for abdominal pain and acute kidney injury. While you were here, you were found to have congestive heart failure and pulmonary hypertension. We gave you medicines to help you get rid of extra fluid in your body. You had an EGD and colonoscopy that were normal, they found two polyps that were sent off. Follow up with your PCP or GI outpatient for results.  POST-HOSPITAL & CARE INSTRUCTIONS You need follow up with your GI doctors please call to schedule appt - [phone] Follow up with you Heart doctors as well.  Schedule a PCP appt in 1 week  Please let PCP/Specialists know of any changes that were made.  Please see medications section of this packet for any medication changes.   DOCTOR'S APPOINTMENT & FOLLOW UP CARE INSTRUCTIONS  See above  RETURN PRECAUTIONS: - Increased swelling in your legs - Increased shortness of breath or chest pain - Sudden weight gain  Take care and be well!  Family Medicine Teaching Service  [place]  [facility]  [address] [place], [place] [address] [phone]

## 2022-11-28 NOTE — Discharge Summary (Cosign Needed Addendum)
Family Medicine Teaching [facility] Discharge Summary  Patient name: Isaac Valdez Medical record number: [id number] Date of birth: Jun 10, 1960 Age: 62 y.o. Gender: male Date of Admission: 11/19/2022  Date of Discharge: 11/28/22 Admitting Physician: Lincoln Brigham, MD  Primary Care Provider: Jerrell Mylar, PA-C Consultants: Cardiology, GI  Indication for Hospitalization: AKI, abdominal pain  Discharge Diagnoses/Problem List:  Principal Problem for Admission: Acute systolic heart failure, AKI Other Problems addressed during stay:  Principal Problem:   Acute on chronic systolic and diastolic  (congestive) heart failure (HCC) Active Problems:   AKI (acute kidney injury) (HCC)   Gout   Type 2 diabetes mellitus with other specified complication (HCC)   Acute HFrEF (heart failure with reduced ejection fraction) (HCC)   Unintentional weight loss   Chronic diarrhea   Heme positive stool   Ventricular tachycardia, non-sustained (HCC)   Iron deficiency anemia   Chronic diastolic congestive heart failure [facility])    Brief Hospital Course:  Isaac Valdez is a 62 y.o.male with a history of CHF, A Flutter, T2DM, GERD, HTN, HLD who was admitted to the [facility] Medicine Teaching Service at [facility] for abdominal pain and AKI. His hospital course is detailed below:  AKI Likely in the setting of his nausea, vomiting along with minimal PO intake as noted on admission. Had been compliant with his medications. Creatinine 2.25 on admission, decreased to 2.06. Renal US unremarkable, micro ACR elevated at 106. Creatinine improved throughout inpatient stay, down to 1.49 on disposition. Recheck on follow up, possibly 2/2 cardiorenal.   Abdominal Pain Nonspecific abdominal pain since June, but worsening over the last 2 to 3 weeks with associated nausea, vomiting, 40 to 50 pound weight loss in the last year, loss of appetite. RUQ [place] circumferential gallbladder wall thickening and edema, CT A/P with  small hiatal hernia, mild nonspecific gallbladder wall thickening as above. Both overall nonspecific. Abdominal pain resolved on 9/16 during admission and tolerated PO well. GI consulted, endoscopy and colonoscopy done, two presumed benign polyps were removed and sent to pathology in addition to biopsies obtained in EGD. Follow up for this outpatient with GI.    Heart Failure Echo EF 25-30%, severely elevated pulmonary artery systolic pressure, BNP elevated into 3000's. R heart cath consistent with severe pulmonary hypertension. PICC line was placed to monitor cardiac pressures. Pt was diuresed with IV Lasix throughout stay which was then transitioned to PO Torsemide. GDMT on disposition per cardiology recommendations: Torsemide 20 Mg daily, Imdur 60 Mg, hydralazine 100 Mg 3 times daily, Coreg 12.5 Mg twice daily, Entresto twice daily, Farxiga 10 Mg daily, spironolactone 25 Mg daily. Diuresed well and follow up with cardiology outpatient, scheduled prior to disposition.   Shortness of Breath Intermittent shortness of breath with exertion x 1 week. V/Q scan negative. Trops trended flat in 20's. EKG non concerning for MI.   Anemia--iron SAT 14%, would benefit from IV iron, noted in follow up recommendations   Other chronic conditions were medically managed with home medications and formulary alternatives as necessary (CHF, HTN, aflutter, HLD)  PCP Follow-up Recommendations: Follow up anemia, placed order for IV iron outpatient, patient to be contacted by transfusion center outpatient, verified with inpatient pharmacy  Polyp pathology f/u and schedule colonoscopy based on results Follow up kidney function and anemia, BMP/CBC  Reconsider using Glipizide PRN, may not need for DM    Disposition: Home, No PT/OT f/u   Discharge Condition: Stable   Discharge Exam:  Vitals:   11/28/22 0729 11/28/22 1340  BP: 126/77 115/63  Pulse:  65  Resp:    Temp:  97.9 F (36.6 C)  SpO2: 91% 99%   Per Dr.  Danford Bad note:  Physical Exam: General: No acute distress, resting comfortably watching TV Cardiovascular: RRR, no murmurs Respiratory: No increased work of breathing, on CPAP Abdomen: Soft, non tender Extremities: no BLE swelling noted. Bilateral elbows with swelling and warmth, no redness and full ROM bilateral elbows.   Significant Procedures:   Cardiac catheterization 11/23/22:  Severely elevated RHC with severe Pulm HTN  EGD 11/27/22: Congested duodenal mucosa. Otherwise nml findings   Colonoscopy 11/27/22:  Per note:  - One 5 mm polyp in the cecum, removed with a cold snare. Resected and retrieved.  - One 4 mm polyp at the splenic flexure, removed with a cold snare. Resected and retrieved.  - The examined portion of the ileum was normal.  - Non- bleeding internal hemorrhoids.   Significant Labs and Imaging:  Recent Labs  Lab 11/27/22 0524 11/28/22 0445  WBC 5.0 4.8  HGB 12.4* 12.0*  HCT 38.9* 37.9*  PLT 267 255   Recent Labs  Lab 11/27/22 0524 11/28/22 0445  NA 139 136  K 4.5 4.3  CL 104 107  CO2 23 22  GLUCOSE 93 98  BUN 37* 34*  CREATININE 1.37* 1.49*  CALCIUM 9.5 9.1  MG 2.2 2.4   11/19/22 CXR:  No active disease   11/19/22 CT AP: Mild nonspecific gallbladder thickening. Otherwise without acute finding  11/19/22: RUQ U/S Gallbladder wall thickening  11/20/22 V/Q Scan Negative for PE   11/21/22 Renal U/S:  Negative   11/24/22 Cardiac MR: Per read: "Severely dilated left ventricle with mild concentric LV hypertrophy. EF 28%, diffuse hypokinesis.   2.  Moderately dilated right ventricle with RV EF 30%.   3. Non-coronary pattern LGE involving the basal RV insertion sites (nonspecific, can be seen with pressure/volume overload) and also a diffuse stripe of mid-wall LGE in the basal septal wall. This is also somewhat nonspecific and can be seen with dilated cardiomyopathies."  Echo 11/19/22:  Left ventricular ejection fraction, by  estimation, is 25 to 30% . The left ventricle has severely decreased function. The left ventricle demonstrates global hypokinesis. The left ventricular internal cavity size was mildly to moderately dilated. RV function moderately reduced.  Left atrial enlargement. Moderate to severe MVR    Results/Tests Pending at Time of Discharge: Biopsy from colonoscopy   Discharge Medications:  Allergies as of 11/28/2022       Reactions   Asa [aspirin] Other (See Comments)   Indigestion Stomach discomfort        Medication List     STOP taking these medications    esomeprazole 40 MG capsule Commonly known as: NEXIUM       TAKE these medications    acetaminophen 500 MG tablet Commonly known as: TYLENOL Take 1,000 mg by mouth 2 (two) times daily as needed for moderate pain.   albuterol 108 (90 Base) MCG/ACT inhaler Commonly known as: VENTOLIN HFA Inhale 1-2 puffs into the lungs every 6 (six) hours as needed for wheezing or shortness of breath.   atorvastatin 20 MG tablet Commonly known as: LIPITOR Take 20 mg by mouth daily.   carvedilol 12.5 MG tablet Commonly known as: COREG Take 1 tablet (12.5 mg total) by mouth 2 (two) times daily. What changed:  medication strength how much to take   clonazePAM 0.5 MG tablet Commonly known as: KLONOPIN Take 1 tablet (0.5 mg  total) by mouth 2 (two) times daily as needed. What changed: when to take this   colchicine 0.6 MG tablet Take as directed for gout flare.   dapagliflozin propanediol 10 MG Tabs tablet Commonly known as: FARXIGA Take 1 tablet (10 mg total) by mouth daily. Start taking on: November 29, 2022   diclofenac Sodium 1 % Gel Commonly known as: Voltaren Apply 2 g topically 4 (four) times daily. To elbows   Entresto 24-26 MG Generic drug: sacubitril-valsartan Take 1 tablet by mouth 2 (two) times daily.   feeding supplement Liqd Take 237 mLs by mouth 2 (two) times daily between meals.   ferrous sulfate 325 (65  FE) MG tablet Take 1 tablet (325 mg total) by mouth every other day. Start taking on: November 29, 2022   glipiZIDE 5 MG tablet Commonly known as: GLUCOTROL Take 5 mg by mouth daily as needed (CBG >120).   hydrALAZINE 100 MG tablet Commonly known as: APRESOLINE Take 1 tablet (100 mg total) by mouth 3 (three) times daily.   isosorbide mononitrate 60 MG 24 hr tablet Commonly known as: IMDUR Take 1 tablet (60 mg total) by mouth daily. Start taking on: November 29, 2022   pantoprazole 40 MG tablet Commonly known as: PROTONIX Take 1 tablet (40 mg total) by mouth daily.   polyethylene glycol 17 g packet Commonly known as: MIRALAX / GLYCOLAX Take 17 g by mouth daily as needed for mild constipation, moderate constipation or severe constipation.   senna 8.6 MG Tabs tablet Commonly known as: SENOKOT Take 1 tablet (8.6 mg total) by mouth daily as needed for mild constipation.   spironolactone 25 MG tablet Commonly known as: ALDACTONE Take 1 tablet (25 mg total) by mouth daily. Start taking on: November 29, 2022   torsemide 20 MG tablet Commonly known as: DEMADEX Take 1 tablet (20 mg total) by mouth daily.        Discharge Instructions: Please refer to Patient Instructions section of EMR for full details.  Patient was counseled important signs and symptoms that should prompt return to medical care, changes in medications, dietary instructions, activity restrictions, and follow up appointments.   Follow-Up Appointments:  Follow-up Information     Jerrell Mylar, PA-C. Schedule an appointment as soon as possible for a visit in 1 week(s).   Specialty: Internal Medicine Contact information: [address] [place] [address] [phone]                Cardiology Appt with Dr. Jeri Cos scheduled  GI appt with Dr. Tomasa Rand, provided patient with phone number to call   Alfredo Martinez, MD 11/28/2022, 3:32 PM PGY-3, [facility]

## 2022-11-28 NOTE — Progress Notes (Signed)
    As per Dr. Alford Highland note - ok to d/c home today. I reviewed telemetry, there are scattered PVC's, but no NSVT was noted. This was mentioned in Dr. Theora Gianotti note from today. I reassured him. He does have  history of NSVT, which would not be unusual given his low EF. Will need PICC removed prior to d/c - this is awaiting IV team removal.  Chrystie Nose, MD, [facility], FACP  [facility]  Medical Director of the Advanced Lipid Disorders &  Cardiovascular Risk Reduction Clinic Diplomate of the American Board of Clinical Lipidology Attending Cardiologist  Direct Dial: [phone]  Fax: [phone]  Website:  [email or web address]

## 2022-11-29 ENCOUNTER — Encounter (HOSPITAL_COMMUNITY): Payer: Self-pay | Admitting: Gastroenterology

## 2022-11-30 ENCOUNTER — Telehealth: Payer: Self-pay | Admitting: Pharmacy Technician

## 2022-11-30 ENCOUNTER — Other Ambulatory Visit: Payer: Self-pay | Admitting: Pharmacy Technician

## 2022-11-30 DIAGNOSIS — D509 Iron deficiency anemia, unspecified: Secondary | ICD-10-CM | POA: Insufficient documentation

## 2022-11-30 LAB — SURGICAL PATHOLOGY

## 2022-11-30 NOTE — Telephone Encounter (Addendum)
Patient referred to infusion pharmacy team for ambulatory infusion of IV iron.   Insurance - [employer] Medicare IV Iron Therapy - Feraheme 510mg  IV x2 doses Infusion appointments - 10/1 and 10/8

## 2022-11-30 NOTE — Telephone Encounter (Signed)
/  Auth Submission: NO AUTH NEEDED Site of care: Site of care: [facility] Payer: UHC MEDICARE Medication & CPT/J Code(s) submitted: Feraheme (ferumoxytol) [id number] Route of submission (phone, fax, portal):  Phone # Fax # Auth type: Buy/Bill PB Units/visits requested: X2 DOSES Reference number:  Approval from:11/30/22 - 02/06/23

## 2022-12-01 NOTE — Progress Notes (Signed)
Isaac Valdez,  The biopsies taken from your stomach were notable for mild reactive gastropathy which is a common finding and often related to use of certain medications (usually NSAIDs), but there was no evidence of Helicobacter pylori infection. This common finding is not felt to necessarily be a cause of any particular symptom or anemia and there is no specific treatment or further evaluation recommended. The biopsies of your duodenum were also unremarkable, with no evidence of celiac disease.  The two polyps which I removed during your colonoscopy were proven to be completely benign but are considered "pre-cancerous" polyps that MAY have grown into cancer if they had not been removed.  Studies shows that at least 20% of women over age 68 and 30% of men over age 41 have pre-cancerous polyps.  Based on current nationally recognized surveillance guidelines, I recommend that you have a repeat colonoscopy in 7 years.

## 2022-12-04 NOTE — Telephone Encounter (Deleted)
Patient referred to infusion pharmacy team for ambulatory infusion of IV iron.   Insurance - [employer] Medicare IV Iron Therapy - Feraheme 510mg  IV x2 doses Infusion appointments - 10/1 and 10/8

## 2022-12-08 MED ORDER — DIPHENHYDRAMINE HCL 25 MG PO CAPS: 25.0000 mg | ORAL_CAPSULE | Freq: Once | ORAL | Status: DC

## 2022-12-08 MED ORDER — ACETAMINOPHEN 325 MG PO TABS: 650.0000 mg | ORAL_TABLET | Freq: Once | ORAL | Status: DC

## 2022-12-08 MED ORDER — SODIUM CHLORIDE 0.9 % IV SOLN
510.0000 mg | Freq: Once | INTRAVENOUS | Status: DC
  Filled 2022-12-08: qty 17

## 2022-12-12 ENCOUNTER — Other Ambulatory Visit: Payer: Self-pay | Admitting: Student

## 2022-12-14 ENCOUNTER — Other Ambulatory Visit: Payer: Self-pay

## 2022-12-14 ENCOUNTER — Encounter (HOSPITAL_COMMUNITY): Payer: Self-pay

## 2022-12-14 ENCOUNTER — Emergency Department (HOSPITAL_COMMUNITY)
Admission: EM | Admit: 2022-12-14 | Discharge: 2022-12-15 | Disposition: A | Discharge status: Home / Self Care | Payer: Medicare Other | Attending: Emergency Medicine | Admitting: Emergency Medicine

## 2022-12-14 DIAGNOSIS — E119 Type 2 diabetes mellitus without complications: Secondary | ICD-10-CM | POA: Insufficient documentation

## 2022-12-14 DIAGNOSIS — Z87891 Personal history of nicotine dependence: Secondary | ICD-10-CM | POA: Diagnosis not present

## 2022-12-14 DIAGNOSIS — N179 Acute kidney failure, unspecified: Secondary | ICD-10-CM | POA: Insufficient documentation

## 2022-12-14 DIAGNOSIS — I110 Hypertensive heart disease with heart failure: Secondary | ICD-10-CM | POA: Insufficient documentation

## 2022-12-14 DIAGNOSIS — I509 Heart failure, unspecified: Secondary | ICD-10-CM | POA: Diagnosis not present

## 2022-12-14 DIAGNOSIS — R7989 Other specified abnormal findings of blood chemistry: Secondary | ICD-10-CM | POA: Diagnosis present

## 2022-12-14 LAB — CBC
HCT: 40.9 % (ref 39.0–52.0)
Hemoglobin: 13.2 g/dL (ref 13.0–17.0)
MCH: 28.9 pg (ref 26.0–34.0)
MCHC: 32.3 g/dL (ref 30.0–36.0)
MCV: 89.7 fL (ref 80.0–100.0)
Platelets: 196 10*3/uL (ref 150–400)
RBC: 4.56 MIL/uL (ref 4.22–5.81)
RDW: 15.5 % (ref 11.5–15.5)
WBC: 4.1 10*3/uL (ref 4.0–10.5)
nRBC: 0 % (ref 0.0–0.2)

## 2022-12-14 LAB — BASIC METABOLIC PANEL
Anion gap: 11 (ref 5–15)
BUN: 53 mg/dL — ABNORMAL HIGH (ref 6–20)
CO2: 22 mmol/L (ref 22–32)
Calcium: 9.4 mg/dL (ref 8.9–10.3)
Chloride: 106 mmol/L (ref 98–111)
Creatinine, Ser: 2.03 mg/dL — ABNORMAL HIGH (ref 0.61–1.24)
GFR, Estimated: 37 mL/min — ABNORMAL LOW (ref >60)
Glucose, Bld: 73 mg/dL (ref 70–99)
Potassium: 4.4 mmol/L (ref 3.5–5.1)
Sodium: 139 mmol/L (ref 135–145)

## 2022-12-14 LAB — URINALYSIS, ROUTINE W REFLEX MICROSCOPIC
Bacteria, UA: NONE SEEN
Bilirubin Urine: NEGATIVE
Glucose, UA: 50 mg/dL — AB
Hgb urine dipstick: NEGATIVE
Ketones, ur: NEGATIVE mg/dL
Leukocytes,Ua: NEGATIVE
Nitrite: NEGATIVE
Protein, ur: NEGATIVE mg/dL
Specific Gravity, Urine: 1.009 (ref 1.005–1.030)
pH: 5 (ref 5.0–8.0)

## 2022-12-14 NOTE — ED Triage Notes (Signed)
Pt sent by PCP for abnormal kidney labs. BUN 34, Cr 1.34. Pt c/o pain in back near kidney area.

## 2022-12-15 ENCOUNTER — Emergency Department (HOSPITAL_COMMUNITY): Payer: Medicare Other

## 2022-12-15 MED ORDER — HYDROMORPHONE HCL 1 MG/ML IJ SOLN
0.5000 mg | Freq: Once | INTRAMUSCULAR | Status: AC
  Administered 2022-12-15: 0.5 mg via INTRAVENOUS
  Filled 2022-12-15: qty 1

## 2022-12-15 MED ORDER — SODIUM CHLORIDE 0.9 % IV BOLUS
500.0000 mL | Freq: Once | INTRAVENOUS | Status: AC
  Administered 2022-12-15: 500 mL via INTRAVENOUS

## 2022-12-15 NOTE — Discharge Instructions (Signed)
You were evaluated in the Emergency Department and after careful evaluation, we did not find any emergent condition requiring admission or further testing in the hospital.  Your exam/testing today is overall reassuring.  Your kidney function is a bit worse than last time we checked it.  Recommend drinking a bit more water than you have been and pausing/stopping your spironolactone until you can follow-up with your primary care doctor.  Recommend recheck of your kidney numbers within the next week or so.  Please return to the Emergency Department if you experience any worsening of your condition.   Thank you for allowing [place] to be a part of your care.

## 2022-12-15 NOTE — ED Notes (Signed)
Assumed care of patient.NAD noted at this time, pt resting in bed, respirations even and unlabored, skin warm and dry, bed in low position, call light within reach. Comfort measures offered. Will continue to monitor.

## 2022-12-15 NOTE — ED Provider Notes (Signed)
[facility] DEPT [facility] Emergency Department Provider Note MRN:  [id number]  Arrival date & time: 12/15/22     Chief Complaint   abnormal labs   History of Present Illness   Isaac Valdez is a 62 y.o. year-old male with a history of CHF, hypertension, diabetes presenting to the ED with chief complaint of abnormal labs.  Sent here after routine labs done at PCP, told his kidney numbers were bad.  Also having some bilateral back/flank pain today.  Recently started on a number of different medications last month including spironolactone.  Has been peeing a lot.  Review of Systems  A thorough review of systems was obtained and all systems are negative except as noted in the HPI and PMH.   Patient's Health History    Past Medical History:  Diagnosis Date   Abdominal pain 11/20/2022   Arthritis    Back pain    CHF (congestive heart failure) (HCC)    Diabetes mellitus    Dyspnea    GERD (gastroesophageal reflux disease)    Gout    High cholesterol    Hypertension    Sleep apnea    uses BIPAP nightly    Past Surgical History:  Procedure Laterality Date   A-FLUTTER ABLATION N/A 04/21/2017   Procedure: A-FLUTTER ABLATION;  Surgeon: Regan Lemming, MD;  [facility]: [facility] INVASIVE CV LAB;  Service: Cardiovascular;  Laterality: N/A;   BACK SURGERY     BIOPSY  11/27/2022   Procedure: BIOPSY;  Surgeon: Jenel Lucks, MD;  Location: [facility] ENDOSCOPY;  Service: Gastroenterology;;   COLONOSCOPY WITH PROPOFOL N/A 11/27/2022   Procedure: COLONOSCOPY WITH PROPOFOL;  Surgeon: Jenel Lucks, MD;  Location: [facility] ENDOSCOPY;  Service: Gastroenterology;  Laterality: N/A;   ESOPHAGOGASTRODUODENOSCOPY (EGD) WITH PROPOFOL N/A 11/27/2022   Procedure: ESOPHAGOGASTRODUODENOSCOPY (EGD) WITH PROPOFOL;  Surgeon: Jenel Lucks, MD;  Location: [facility] ENDOSCOPY;  Service: Gastroenterology;  Laterality: N/A;   MANDIBLE FRACTURE SURGERY     POLYPECTOMY  11/27/2022   Procedure: POLYPECTOMY;   Surgeon: Jenel Lucks, MD;  Location: [facility] ENDOSCOPY;  Service: Gastroenterology;;   RIGHT HEART CATH N/A 11/23/2022   Procedure: RIGHT HEART CATH;  Surgeon: Lennette Bihari, MD;  Location: [facility] INVASIVE CV LAB;  Service: Cardiovascular;  Laterality: N/A;   SHOULDER ARTHROSCOPY WITH BICEPSTENOTOMY Right 08/15/2020   Procedure: SHOULDER ARTHROSCOPY WITH BICEPSTENOTOMY;  Surgeon: Bjorn Pippin, MD;  Location: [facility];  Service: Orthopedics;  Laterality: Right;   SHOULDER ARTHROSCOPY WITH ROTATOR CUFF REPAIR Right 08/15/2020   Procedure: SHOULDER ARTHROSCOPY WITH ARTHROSCOPIC ROTATOR CUFF REPAIR;  Surgeon: Bjorn Pippin, MD;  Location: [facility];  Service: Orthopedics;  Laterality: Right;    Family History  Problem Relation Age of Onset   Heart disease Mother    Hypertension Sister    Other Father        hypothermia   Heart failure Son     Social History   Socioeconomic History   Marital status: Married    Spouse name: Not on file   Number of children: 2   Years of education: 11   Highest education level: 11th grade  Occupational History   Occupation: part time truck driver   Occupation: on disability  Tobacco Use   Smoking status: Former    Current packs/day: 0.50    Types: Cigars, Cigarettes   Smokeless tobacco: Never   Tobacco comments:    occais cigar  Vaping Use   Vaping status: Never Used  Substance and Sexual Activity   Alcohol use: Yes    Comment: social   Drug use: No   Sexual activity: Yes    Birth control/protection: None  Other Topics Concern   Not on file  Social History Narrative   Lives with wife in a one story home.  Has 2 children.     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     Education: 11th grade.    Right handed   Social Determinants of Health   Financial Resource Strain: Low Risk  (07/21/2022)   Received from [facility]   Overall Financial Resource Strain (CARDIA)    Difficulty of Paying  Living Expenses: Not hard at all  Food Insecurity: No Food Insecurity (11/19/2022)   Hunger Vital Sign    Worried About Running Out of Food in the Last Year: Never true    Ran Out of Food in the Last Year: Never true  Transportation Needs: No Transportation Needs (11/19/2022)   PRAPARE - Administrator, Civil Service (Medical): No    Lack of Transportation (Non-Medical): No  Physical Activity: Not on file  Stress: No Stress Concern Present (08/11/2022)   Received from [facility] - Occupational Stress Questionnaire    Feeling of Stress : Only a little  Social Connections: Unknown (07/20/2021)   Received from [facility]   Social Network    Social Network: Not on file  Intimate Partner Violence: Not At Risk (11/19/2022)   Humiliation, Afraid, Rape, and Kick questionnaire    Fear of Current or Ex-Partner: No    Emotionally Abused: No    Physically Abused: No    Sexually Abused: No     Physical Exam   Vitals:   12/14/22 2014 12/15/22 0136  BP: (!) 141/78 (!) 151/81  Pulse: 80 71  Resp: 18 16  Temp: 97.9 F (36.6 C) 98.1 F (36.7 C)  SpO2: 95% 100%    CONSTITUTIONAL: Well-appearing, NAD NEURO/PSYCH:  Alert and oriented x 3, no focal deficits EYES:  eyes equal and reactive ENT/NECK:  no LAD, no JVD CARDIO: Regular rate, well-perfused, normal S1 and S2 PULM:  CTAB no wheezing or rhonchi GI/GU:  non-distended, non-tender MSK/SPINE:  No gross deformities, no edema SKIN:  no rash, atraumatic   *Additional and/or pertinent findings included in MDM below  Diagnostic and Interventional Summary    EKG Interpretation Date/Time:    Ventricular Rate:    PR Interval:    QRS Duration:    QT Interval:    QTC Calculation:   R Axis:      Text Interpretation:         Labs Reviewed  URINALYSIS, ROUTINE W REFLEX MICROSCOPIC - Abnormal; Notable for the following components:      Result Value   Color, Urine  STRAW (*)    Glucose, UA 50 (*)    All other components within normal limits  BASIC METABOLIC PANEL - Abnormal; Notable for the following components:   BUN 53 (*)    Creatinine, Ser 2.03 (*)    GFR, Estimated 37 (*)    All other components within normal limits  CBC    CT Renal Stone Study  Final Result      Medications  sodium chloride 0.9 % bolus 500 mL (0 mLs Intravenous Stopped 12/15/22 0249)  HYDROmorphone (DILAUDID) injection 0.5 mg (0.5 mg Intravenous Given 12/15/22 0111)  Procedures  /  Critical Care Procedures  ED Course and Medical Decision Making  Initial Impression and Ddx Reportedly patient having worsening renal function, possibly over diuresed.  Repeating labs, given the flank pain will obtain CT imaging to exclude kidney stone or obstructive uropathy  Past medical/surgical history that increases complexity of ED encounter: CHF  Interpretation of Diagnostics I personally reviewed the laboratory assessment and my interpretation is as follows: BUN and creatinine elevation compared to prior  CT renal study is normal.  Patient Reassessment and Ultimate Disposition/Management     Patient has good follow-up, suspicion for overdiuresis, will follow spironolactone, increase water intake a bit, follow-up with primary care doctor and cardiology.  Patient management required discussion with the following services or consulting groups:  None  Complexity of Problems Addressed Acute illness or injury that poses threat of life of bodily function  Additional Data Reviewed and Analyzed Further history obtained from: Prior labs/imaging results  Additional Factors Impacting ED Encounter Risk Consideration of hospitalization  Elmer Sow. Pilar Plate, MD [facility] Emergency Medicine [facility] [email or web address]  Final Clinical Impressions(s) / ED Diagnoses     ICD-10-CM   1. AKI (acute kidney injury) (HCC)  N17.9       ED Discharge Orders     None         Discharge Instructions Discussed with and Provided to Patient:     Discharge Instructions      You were evaluated in the Emergency Department and after careful evaluation, we did not find any emergent condition requiring admission or further testing in the hospital.  Your exam/testing today is overall reassuring.  Your kidney function is a bit worse than last time we checked it.  Recommend drinking a bit more water than you have been and pausing/stopping your spironolactone until you can follow-up with your primary care doctor.  Recommend recheck of your kidney numbers within the next week or so.  Please return to the Emergency Department if you experience any worsening of your condition.   Thank you for allowing [place] to be a part of your care.       Sabas Sous, MD 12/15/22 [phone]

## 2022-12-18 ENCOUNTER — Ambulatory Visit: Payer: Medicare Other | Admitting: *Deleted

## 2022-12-18 ENCOUNTER — Ambulatory Visit (HOSPITAL_COMMUNITY)
Admission: RE | Admit: 2022-12-18 | Discharge: 2022-12-18 | Disposition: A | Discharge status: Home / Self Care | Payer: Medicare Other | Source: Ambulatory Visit | Attending: Cardiology | Admitting: Cardiology

## 2022-12-18 VITALS — BP 126/60 | HR 67 | Temp 98.1°F | Resp 18 | Ht 71.0 in | Wt 244.4 lb

## 2022-12-18 VITALS — BP 128/80 | HR 82 | Wt 244.0 lb

## 2022-12-18 DIAGNOSIS — N189 Chronic kidney disease, unspecified: Secondary | ICD-10-CM | POA: Insufficient documentation

## 2022-12-18 DIAGNOSIS — I130 Hypertensive heart and chronic kidney disease with heart failure and stage 1 through stage 4 chronic kidney disease, or unspecified chronic kidney disease: Secondary | ICD-10-CM | POA: Diagnosis not present

## 2022-12-18 DIAGNOSIS — I5032 Chronic diastolic (congestive) heart failure: Secondary | ICD-10-CM | POA: Diagnosis present

## 2022-12-18 DIAGNOSIS — I483 Typical atrial flutter: Secondary | ICD-10-CM

## 2022-12-18 DIAGNOSIS — I272 Pulmonary hypertension, unspecified: Secondary | ICD-10-CM | POA: Diagnosis not present

## 2022-12-18 DIAGNOSIS — I5022 Chronic systolic (congestive) heart failure: Secondary | ICD-10-CM | POA: Diagnosis not present

## 2022-12-18 DIAGNOSIS — I5020 Unspecified systolic (congestive) heart failure: Secondary | ICD-10-CM | POA: Diagnosis not present

## 2022-12-18 DIAGNOSIS — N182 Chronic kidney disease, stage 2 (mild): Secondary | ICD-10-CM | POA: Diagnosis not present

## 2022-12-18 DIAGNOSIS — D509 Iron deficiency anemia, unspecified: Secondary | ICD-10-CM

## 2022-12-18 DIAGNOSIS — I480 Paroxysmal atrial fibrillation: Secondary | ICD-10-CM | POA: Diagnosis present

## 2022-12-18 LAB — BASIC METABOLIC PANEL
Anion gap: 12 (ref 5–15)
BUN: 53 mg/dL — ABNORMAL HIGH (ref 6–20)
CO2: 20 mmol/L — ABNORMAL LOW (ref 22–32)
Calcium: 9.3 mg/dL (ref 8.9–10.3)
Chloride: 107 mmol/L (ref 98–111)
Creatinine, Ser: 1.7 mg/dL — ABNORMAL HIGH (ref 0.61–1.24)
GFR, Estimated: 46 mL/min — ABNORMAL LOW (ref >60)
Glucose, Bld: 97 mg/dL (ref 70–99)
Potassium: 4.3 mmol/L (ref 3.5–5.1)
Sodium: 139 mmol/L (ref 135–145)

## 2022-12-18 LAB — BRAIN NATRIURETIC PEPTIDE: B Natriuretic Peptide: 208.3 pg/mL — ABNORMAL HIGH (ref 0.0–100.0)

## 2022-12-18 MED ORDER — DIPHENHYDRAMINE HCL 25 MG PO CAPS
25.0000 mg | ORAL_CAPSULE | Freq: Once | ORAL | Status: AC
  Administered 2022-12-18: 25 mg via ORAL
  Filled 2022-12-18: qty 1

## 2022-12-18 MED ORDER — SODIUM CHLORIDE 0.9 % IV SOLN
510.0000 mg | Freq: Once | INTRAVENOUS | Status: AC
  Administered 2022-12-18: 510 mg via INTRAVENOUS
  Filled 2022-12-18: qty 17

## 2022-12-18 MED ORDER — ACETAMINOPHEN 325 MG PO TABS
650.0000 mg | ORAL_TABLET | Freq: Once | ORAL | Status: AC
  Administered 2022-12-18: 650 mg via ORAL
  Filled 2022-12-18: qty 2

## 2022-12-18 MED ORDER — ISOSORB DINITRATE-HYDRALAZINE 20-37.5 MG PO TABS: 1.0000 | ORAL_TABLET | Freq: Three times a day (TID) | ORAL | 3 refills | Status: DC

## 2022-12-18 NOTE — Progress Notes (Signed)
Diagnosis: Iron Deficiency Anemia  Provider:  Chilton Greathouse MD  Procedure: IV Infusion  IV Type: Peripheral, IV Location: L Antecubital  Feraheme (Ferumoxytol), Dose: 510 mg  Infusion Start Time: 1427  Infusion Stop Time: 1442  Post Infusion IV Care: Observation period completed and Peripheral IV Discontinued  Discharge: Condition: Good, Destination: Home . AVS Provided  Performed by:  Marlow Baars [place]tt, RN

## 2022-12-18 NOTE — Progress Notes (Signed)
ADVANCED HEART FAILURE NEW PATIENT CLINIC NOTE  Referring Physician: Jerrell Mylar, Demetrius Charity*  Primary Care: Jerrell Mylar, PA-C Primary Cardiologist:  HPI: Isaac Valdez is a 62 y.o. male with a PMH of DMII, PAF, A flutter ablation, OSA, HTN, HLD, GERD, mitral regurgitation, movement disorder, gout, ETOH abuse, cocaine last used 2009, and newly reduced LVEF who presents for initial visit for further evaluation and treatment of heart failure/cardiomyopathy.     Patient with a remote history of cocaine use, though heavy alcohol use starting in 2020. Had an echo in 2018 with EF 45-50%.    In 2019 he underwent aflutter ablation. Eliquis was stopped.    Recently established with Cardiology, Dr Chales Abrahams at [place]. 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 In May he had an abnormal stress test and was set up for cath. LHC showed  minimal disease. LVEDP 16. LVEF 45%.    He was seen at [facility] ED on 11/13/22 with nausea, vomiting, abdominal bloating and increased dyspnea. NT-Pro BNP > 10000. Started on PPI and discharged to home.    He was seen in consultation at [facility] in the Neurology Movement Clinic for RUE jerking/twitching on 11/19/22. Work up concerning for dystonia. Imaging ordered. Says he felt terrible that day and went straight to [name] for admit. He was found to be volume overloaded with AKI and newly reduced EF. He underwent RHC and diuresis and tolerated starting medical therapy well.        SUBJECTIVE: Patient reports that since he was discharged he has had frequent episodes of dizziness, lightheadedness, and nausea.  He reports that his blood pressure is sometimes dropped into the 90s over 50s and he feels poorly after he takes his medications.  He has been urinating frequently, and had to go to the emergency department 3 days ago due to a bump in creatinine.  His spironolactone was held at that time.  He reports overall feeling well from a shortness of  breath standpoint, and has not gained any weight.  He has been compliant with his medications, and has not drank alcohol recently.  He reports that he mainly drinks because it settles his constant tremor/dystonia, for which she is undergoing workup at [facility].  PMH, current medications, allergies, social history, and family history reviewed in epic.  PHYSICAL EXAM: Vitals:   12/18/22 1032  BP: 128/80  Pulse: 82  SpO2: 99%   GENERAL: Well nourished and in no apparent distress at rest.  HEENT: Negative for xanthelasma. There is no scleral icterus.  The mucous membranes are pink and moist.   CHEST: There are no chest wall deformities. There is no chest wall tenderness. Respirations are unlabored.  Lungs- Clear to auscultation bilaterally, normal work of breathing. CARDIAC:  JVP: Not elevated          Normal rate with regular rhythm. No murmurs, rubs or gallops.  Pulses are 2+ and symmetrical in upper and lower extremities.  Trace lower extremity edema.  ABDOMEN: Soft, non-tender, non-distended. Marland Kitchen  EXTREMITIES: Warm and well perfused with no cyanosis, clubbing.  NEUROLOGIC: Patient is oriented x3, persistent movement/dystonia, especially in the right upper extremity PSYCH: Patients affect is appropriate, there is no evidence of anxiety or depression.  SKIN: Warm and dry; no lesions or wounds.   DATA REVIEW  ECG: 11/22/22: Sinus bradycardia with poor R wave progression    ECHO: 11/21/2022: LVEF 25 to 30%, moderately dilated LV, severely enlarged RV with moderately reduced function.  Elevated RVSP.  Moderate to severe mitral valve regurgitation, functional  CATH: 08/2022: No evidence of obstructive CAD   Heart failure review: - Classification: Heart failure with reduced EF - Etiology: Work up ongoing - NYHA Class: II - Volume status: Euvolemic - ACEi/ARB/ARNI: Currently up-titrating - Aldosterone antagonist: Plan to start at a subsequent visit - Beta-blocker: Currently up-titrating -  Digoxin: Not a candidate - Hydralazine/Nitrates: Currently up-titrating - SGLT2i: Maximally tolerated dose - GLP-1: Consider in future - Advanced therapies: Not needed at this time - ICD: Currently uptitrating GDMT   ASSESSMENT & PLAN:  Heart failure with reduced ejection fraction: Patient with a prior history of heart failure, noted to have reduced EF as far back as 2016.  Evidently recovered function and was taken off his heart failure medications.  Recent exacerbation requiring hospitalization.  Patient appears euvolemic and is having hypotension related symptoms.  Plan on decreasing hydralazine/Imdur with plan to titrate off and increase Entresto with pharmacy assistance.  Does have a strong family history of heart failure, genetic testing at next appointment. -Stop hydralazine/Isordil (100/60) -Start BiDil 37.5/20 3 times a day -Continue Entresto 24/26 mg twice a day -Continue dapagliflozin 10 mg daily -Basic metabolic panel today -Continue torsemide 20 mg daily, if creatinine still elevated will decrease to 20 mg every other day -Follow-up in 2 weeks with pharmacy to ideally titrate up Entresto and reduce BiDil if blood pressure and renal function stable -Genetic testing at future appointment  Pulmonary hypertension: Severe, noted on prior right heart cath.  Mixed pre and postcapillary pulmonary hypertension, VQ scan negative.  Will need to evaluate pulmonary function, no history of autoimmune disease. -PFTs, sleep study -Consider autoimmune workup at future appointment -Will likely need repeat right heart cath when euvolemic to consider vasodilator therapy  Atrial flutter: Had previous flutter ablation, off of anticoagulation currently.  CKD: Last creatinine noted to be 2. -May need to back off diuretics while on GDMT   Clearnce Hasten, MD Advanced Heart Failure Mechanical Circulatory Support 12/18/22

## 2022-12-18 NOTE — Patient Instructions (Addendum)
STOP Hydralazine  STOP Imdur   START Bidil 1 tablet Three times a day  Labs done today, your results will be available in MyChart, we will contact you for abnormal readings.  Please follow up with our heart failure pharmacist as scheduled.  Your physician recommends that you schedule a follow-up appointment in: 6 weeks.  If you have any questions or concerns before your next appointment please send [place] a message through [place] or call our office at [phone].    TO LEAVE A MESSAGE FOR THE NURSE SELECT OPTION 2, PLEASE LEAVE A MESSAGE INCLUDING: YOUR NAME DATE OF BIRTH CALL BACK NUMBER REASON FOR CALL**this is important as we prioritize the call backs  YOU WILL RECEIVE A CALL BACK THE SAME DAY AS LONG AS YOU CALL BEFORE 4:00 PM  At the Advanced Heart Failure Clinic, you and your health needs are our priority. As part of our continuing mission to provide you with exceptional heart care, we have created designated Provider Care Teams. These Care Teams include your primary Cardiologist (physician) and Advanced Practice Providers (APPs- Physician Assistants and Nurse Practitioners) who all work together to provide you with the care you need, when you need it.   You may see any of the following providers on your designated Care Team at your next follow up: Dr Arvilla Meres Dr Marca Ancona Dr. Dorthula Nettles Dr. Clearnce Hasten Amy Filbert Schilder, NP Robbie Lis, [place] [facility] [place], [place] Brynda Peon, NP [place] Lee, NP Karle Plumber, PharmD   Please be sure to bring in all your medications bottles to every appointment.    Thank you for choosing [facility]

## 2022-12-21 ENCOUNTER — Other Ambulatory Visit: Payer: Self-pay | Admitting: Student

## 2022-12-25 ENCOUNTER — Ambulatory Visit: Payer: Medicare Other

## 2022-12-28 ENCOUNTER — Telehealth: Payer: Self-pay

## 2022-12-28 ENCOUNTER — Ambulatory Visit: Payer: Medicare Other

## 2022-12-28 VITALS — BP 149/75 | HR 66 | Temp 97.6°F | Resp 16 | Ht 71.0 in | Wt 245.2 lb

## 2022-12-28 DIAGNOSIS — D509 Iron deficiency anemia, unspecified: Secondary | ICD-10-CM | POA: Diagnosis not present

## 2022-12-28 MED ORDER — ACETAMINOPHEN 325 MG PO TABS
650.0000 mg | ORAL_TABLET | Freq: Once | ORAL | Status: AC
  Administered 2022-12-28: 650 mg via ORAL
  Filled 2022-12-28: qty 2

## 2022-12-28 MED ORDER — SODIUM CHLORIDE 0.9 % IV SOLN
510.0000 mg | Freq: Once | INTRAVENOUS | Status: AC
  Administered 2022-12-28: 510 mg via INTRAVENOUS
  Filled 2022-12-28: qty 17

## 2022-12-28 MED ORDER — DIPHENHYDRAMINE HCL 25 MG PO CAPS
25.0000 mg | ORAL_CAPSULE | Freq: Once | ORAL | Status: AC
  Administered 2022-12-28: 25 mg via ORAL
  Filled 2022-12-28: qty 1

## 2022-12-28 NOTE — Telephone Encounter (Signed)
Auth Submission: NO AUTH NEEDED Site of care: Site of care: [facility] Payer: [employer] Medicare Medication & CPT/J Code(s) submitted: Feraheme (ferumoxytol) [id number] Route of submission (phone, fax, portal): portal Phone # Fax # Auth type: Buy/Bill PB Units/visits requested: 510mg  x 4 doses Reference number: [id number] Approval from: 12/28/22 to 03/09/23   I confirmed on the [facility] portal that Feraheme does not need a prior authorization.

## 2022-12-28 NOTE — Progress Notes (Cosign Needed Addendum)
Diagnosis: Iron Deficiency Anemia  Provider:  Chilton Greathouse MD  Procedure: IV Infusion  IV Type: Peripheral, IV Location: L Forearm  Feraheme (Ferumoxytol), Dose: 510 mg  Infusion Start Time: 1546  Infusion Stop Time: 1609  Post Infusion IV Care: Patient declined observation and Peripheral IV Discontinued  Discharge: Condition: Stable, Destination: Home . AVS Declined  Performed by:  Fritzi Mandes, RN

## 2022-12-31 ENCOUNTER — Ambulatory Visit (HOSPITAL_COMMUNITY)
Admission: RE | Admit: 2022-12-31 | Discharge: 2022-12-31 | Disposition: A | Discharge status: Home / Self Care | Payer: Medicare Other | Source: Ambulatory Visit | Attending: Cardiology | Admitting: Cardiology

## 2022-12-31 VITALS — BP 128/76 | HR 79 | Wt 247.0 lb

## 2022-12-31 DIAGNOSIS — I4892 Unspecified atrial flutter: Secondary | ICD-10-CM | POA: Insufficient documentation

## 2022-12-31 DIAGNOSIS — I5022 Chronic systolic (congestive) heart failure: Secondary | ICD-10-CM | POA: Diagnosis present

## 2022-12-31 DIAGNOSIS — N189 Chronic kidney disease, unspecified: Secondary | ICD-10-CM | POA: Diagnosis not present

## 2022-12-31 DIAGNOSIS — E785 Hyperlipidemia, unspecified: Secondary | ICD-10-CM | POA: Diagnosis not present

## 2022-12-31 DIAGNOSIS — I340 Nonrheumatic mitral (valve) insufficiency: Secondary | ICD-10-CM | POA: Insufficient documentation

## 2022-12-31 DIAGNOSIS — Z7984 Long term (current) use of oral hypoglycemic drugs: Secondary | ICD-10-CM | POA: Insufficient documentation

## 2022-12-31 DIAGNOSIS — I480 Paroxysmal atrial fibrillation: Secondary | ICD-10-CM | POA: Diagnosis not present

## 2022-12-31 DIAGNOSIS — I272 Pulmonary hypertension, unspecified: Secondary | ICD-10-CM | POA: Insufficient documentation

## 2022-12-31 DIAGNOSIS — E1122 Type 2 diabetes mellitus with diabetic chronic kidney disease: Secondary | ICD-10-CM | POA: Diagnosis not present

## 2022-12-31 DIAGNOSIS — Z79899 Other long term (current) drug therapy: Secondary | ICD-10-CM | POA: Insufficient documentation

## 2022-12-31 DIAGNOSIS — I130 Hypertensive heart and chronic kidney disease with heart failure and stage 1 through stage 4 chronic kidney disease, or unspecified chronic kidney disease: Secondary | ICD-10-CM | POA: Diagnosis not present

## 2022-12-31 LAB — BASIC METABOLIC PANEL
Anion gap: 10 (ref 5–15)
BUN: 30 mg/dL — ABNORMAL HIGH (ref 6–20)
CO2: 22 mmol/L (ref 22–32)
Calcium: 9.6 mg/dL (ref 8.9–10.3)
Chloride: 107 mmol/L (ref 98–111)
Creatinine, Ser: 1.54 mg/dL — ABNORMAL HIGH (ref 0.61–1.24)
GFR, Estimated: 51 mL/min — ABNORMAL LOW (ref >60)
Glucose, Bld: 82 mg/dL (ref 70–99)
Potassium: 4.4 mmol/L (ref 3.5–5.1)
Sodium: 139 mmol/L (ref 135–145)

## 2022-12-31 LAB — BRAIN NATRIURETIC PEPTIDE: B Natriuretic Peptide: 238.1 pg/mL — ABNORMAL HIGH (ref 0.0–100.0)

## 2022-12-31 MED ORDER — ISOSORB DINITRATE-HYDRALAZINE 20-37.5 MG PO TABS: 0.5000 | ORAL_TABLET | Freq: Three times a day (TID) | ORAL | 3 refills | Status: DC

## 2022-12-31 MED ORDER — TORSEMIDE 20 MG PO TABS: 10.0000 mg | ORAL_TABLET | Freq: Every day | ORAL | 5 refills | Status: DC

## 2022-12-31 MED ORDER — ENTRESTO 49-51 MG PO TABS: 1.0000 | ORAL_TABLET | Freq: Two times a day (BID) | ORAL | 11 refills | Status: DC

## 2022-12-31 MED ORDER — DAPAGLIFLOZIN PROPANEDIOL 10 MG PO TABS: 10.0000 mg | ORAL_TABLET | Freq: Every day | ORAL | 11 refills | Status: AC

## 2022-12-31 NOTE — Progress Notes (Signed)
Advanced Heart Failure Clinic Note   Referring Physician: Jerrell Mylar, Demetrius Charity*  Primary Care: Gerarda Fraction HF Cardiologist: Dr. Elwyn Lade  HPI:  Isaac Valdez is a 62 y.o. male with a PMH of DMII, PAF, A flutter ablation, OSA, HTN, HLD, GERD, mitral regurgitation, movement disorder, gout, ETOH abuse, cocaine last used 2009, and newly reduced LVEF.  Patient with a remote history of cocaine use, though heavy alcohol use starting in 2020. Had an echo in 2018 with EF 45-50%.    In 2019 he underwent aflutter ablation. Eliquis was stopped.    Recently established with Cardiology, Dr Chales Abrahams at [place]. 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 In May he had an abnormal stress test and was set up for cath. LHC showed  minimal disease. LVEDP 16. LVEF 45%.    He was seen at [facility] ED on 11/13/22 with nausea, vomiting, abdominal bloating and increased dyspnea. NT-Pro BNP > 10000. Started on PPI and discharged to home.    He was seen in consultation at [facility] in the Neurology Movement Clinic for RUE jerking/twitching on 11/19/22. Work up concerning for dystonia. Imaging ordered. Said he felt terrible that day and went straight to [facility] for admit. He was found to be volume overloaded with AKI and newly reduced EF. He underwent RHC and diuresis and tolerated starting medical therapy well.   ECHO: 11/21/2022: LVEF 25 to 30%, moderately dilated LV, severely enlarged RV with moderately reduced function. Elevated RVSP. Moderate to severe mitral valve regurgitation, functional  Recently seen by Dr. Elwyn Lade on 12/18/22. He reported since being d/c he had frequent episodes of dizziness, lightheadedness, and nausea. He reported that his BP occasionally dropped into the 90s over 50s and he felt poorly after he took his medications. He had been urinating frequently, and had to go to the ED due to a bump in creatinine. His spironolactone was held at that time. He reported  overall feeling well from a SOB standpoint, and had not gained any weight. He was compliant with his medications, and had not drank alcohol recently. He reported that he mainly drank because it settled his constant tremor/dystonia, for which he was undergoing workup at [facility].   Today he returns to HF clinic for pharmacist medication titration. At last visit with MD, hydralazine 100 mg TID was stopped, Imdur 60 mg daily was stopped and BiDil 20/37.5 mg TID was started (with plans to taper off while working to [employer]). Overall feeling pretty good today. No dizziness or lightheadedness since spironolactone was stopped. No fatigue. Home SBP between 104-120 mmHg. In clinic, BP 128/76 mmHg. No CP/palpitations. No SOB. Notes that he sometimes appears to be SOB, but this is not accurate. The appearance is due to his tremor/dystonia. Home weight has been stable between 240-248 lbs. In clinic, 247 lbs. States that he has been taking torsemide differently than what is listed on his medication list, has been taking torsemide 10 mg daily since the last visit. No LEE, PND or orthopnea. He uses BiPAP at night with no issues. Appetite is great. Reports difficulty affording some medications. Able to obtain [employer] grant today to help decrease copays for his HF medications.    HF Medications: Carvedilol 12.5 mg BID Entresto 24/26 mg BID Farxiga 10 mg daily Torsemide 20 mg daily: taking differently: 10 mg daily BiDil 20/37.5 mg 1 tablet TID  Has the patient been experiencing any side effects to the medications prescribed? No  Does the patient have any problems obtaining medications due to transportation or finances? [employer] Medicare, able to obtain [employer] grant today to decrease cost of copays of his HF medications. Can use for Bidil, Entresto and [place].   Understanding of regimen: good Understanding of indications: good Potential of compliance: good Patient understands to avoid NSAIDs. Patient  understands to avoid decongestants.    Pertinent Lab Values: 12/18/22 Serum creatinine 1.7, BUN 53, Potassium 4.3, Sodium 139, BNP 208.3, Magnesium 2.4 (11/28/22) BMET and BNP today pending  Vital Signs: Weight: 247 lbs (last clinic weight: 244 lbs) Blood pressure: 128/76 mmHg  Heart rate: 79 bpm   Assessment/Plan: Heart failure with reduced ejection fraction: Patient with a prior history of heart failure, noted to have reduced EF as far back as 2016.  Evidently recovered function and was taken off his heart failure medications. Recent exacerbation requiring hospitalization.  -NYHA II, Patient appears euvolemic on exam.  - BMET, BNP today pending -Continue torsemide 10 mg daily - Continue carvedilol 12.5 mg BID -Increase Entresto to 49/51 mg BID starting 01/04/23 -Continue Farxiga 10 mg daily -Decrease BiDil to 10/18.75 mg TID (0.5 tablet TID) - Will continue to hold spironolactone for now. Will need to monitor renal function closely when resumed in the future.  -Has a strong family history of heart failure, consider genetic testing at next appointment with MD.   2. Pulmonary hypertension: Severe, noted on prior right heart cath. Mixed pre and postcapillary pulmonary hypertension, VQ scan negative. Would need to evaluate pulmonary function, no history of autoimmune disease. -PFTs, sleep study -Consider autoimmune workup at future appointment -Would likely need repeat right heart cath when euvolemic to consider vasodilator therapy   3. Atrial flutter: Had previous flutter ablation, off of anticoagulation currently.   4. CKD: Recent Scr noted to be 1.7. -BMET pending   Follow up in 2 weeks at pharmacy clinic   Karle Plumber, PharmD, BCPS, BCCP, CPP Heart Failure Clinic Pharmacist [phone]

## 2022-12-31 NOTE — Patient Instructions (Signed)
It was a pleasure seeing you today!  MEDICATIONS: -We are changing your medications today -Decrease isosorbide-hydralazine to 1/2 tablet three times daily -Increase Entresto to 49/51 mg (1 tablet) twice daily starting on Monday. You may take 2 tablets of the 24/26 mg strength twice daily until you receive the new strength.  -Call if you have questions about your medications.  LABS: -We will call you if your labs need attention.  NEXT APPOINTMENT: Return to clinic in 2 weeks with Pharmacy Clinic.  In general, to take care of your heart failure: -Limit your fluid intake to 2 Liters (half-gallon) per day.   -Limit your salt intake to ideally 2-3 grams (2000-3000 mg) per day. -Weigh yourself daily and record, and bring that "weight diary" to your next appointment.  (Weight gain of 2-3 pounds in 1 day typically means fluid weight.) -The medications for your heart are to help your heart and help you live longer.   -Please contact us before stopping any of your heart medications.  Call the clinic at [phone] with questions or to reschedule future appointments.

## 2023-01-01 NOTE — Progress Notes (Signed)
Advanced Heart Failure Clinic Note   Referring Physician: Jerrell Mylar, Demetrius Charity*  Primary Care: Gerarda Fraction HF Cardiologist: Dr. Elwyn Lade  HPI:  Isaac Valdez is a 62 y.o. male with a PMH of DMII, PAF, A flutter ablation, OSA, HTN, HLD, GERD, mitral regurgitation, movement disorder, gout, ETOH abuse, cocaine last used 2009, and newly reduced LVEF.  Patient with a remote history of cocaine use, though heavy alcohol use starting in 2020. Had an echo in 2018 with EF 45-50%.    In 2019 he underwent aflutter ablation. Eliquis was stopped.    Recently established with Cardiology, Dr Chales Abrahams at [place]. 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 In May he had an abnormal stress test and was set up for cath. LHC showed  minimal disease. LVEDP 16. LVEF 45%.    He was seen at [facility] ED on 11/13/22 with nausea, vomiting, abdominal bloating and increased dyspnea. NT-Pro BNP > 10000. Started on PPI and discharged to home.    He was seen in consultation at [facility] in the Neurology Movement Clinic for RUE jerking/twitching on 11/19/22. Work up concerning for dystonia. Imaging ordered. Said he felt terrible that day and went straight to [facility] for admit. He was found to be volume overloaded with AKI and newly reduced EF. He underwent RHC and diuresis and tolerated starting medical therapy well.   ECHO: 11/21/2022: LVEF 25 to 30%, moderately dilated LV, severely enlarged RV with moderately reduced function. Elevated RVSP. Moderate to severe mitral valve regurgitation, functional  Recently seen by Dr. Elwyn Lade on 12/18/22. He reported since being d/c he had frequent episodes of dizziness, lightheadedness, and nausea. He reported that his BP occasionally dropped into the 90s over 50s and he felt poorly after he took his medications. He had been urinating frequently, and had to go to the ED due to a bump in creatinine. His spironolactone was held at that time. He reported  overall feeling well from a SOB standpoint, and had not gained any weight. He was compliant with his medications, and had not drank alcohol recently. He reported that he mainly drank because it settled his constant tremor/dystonia, for which he was undergoing workup at [facility].   Recently returned to HF clinic for pharmacist medication titration 12/31/22. At prevous visit with MD, hydralazine 100 mg TID was stopped, Imdur 60 mg daily was stopped and BiDil 20/37.5 mg TID was started (with plans to taper off while working to [employer]). Overall was feeling pretty good . No dizziness or lightheadedness since spironolactone was stopped. No fatigue. Home SBP was between 104-120 mmHg. In clinic, BP was 128/76 mmHg. No CP/palpitations. No SOB. Noted that he sometimes appears to be SOB, but this is not accurate. The appearance is due to his tremor/dystonia. Home weight had been stable between 240-248 lbs. In clinic, weight was 247 lbs. Stated that he had been taking torsemide differently than what is listed on his medication list, had been taking torsemide 10 mg daily since the last visit with Dr. Elwyn Lade. No LEE, PND or orthopnea. He reported using BiPAP at night with no issues. Appetite was great. Reported difficulty affording some medications. Able to obtain [employer] grant during visit to help decrease copays for his HF medications.   Today he returns to HF clinic for pharmacist medication titration. At last visit with pharmacy clinic, Bidil was decreased to 0.5 tablet TID and Entresto was increased to 49/51 mg BID. Overall he is feeling well  today. Able to make all changes from last visit and is tolerating the medications well. Does note headaches with Bidil but not severe. Since last visit, his PCP started him on allopurinol for gout. No dizziness unless SBP drops <110. This is usually mild and he does not worry unless it drops to the 90s. BP at home since the most recent medication changes has been 110-130  mmHg after medications. Notes he will run on the higher side if his salt intake increases. BP in clinic today is 142/76. No CP or palpitations. No SOB/DOE. Home weight in the mornings has been stable at 246-248 lbs. He takes torsemide 10 mg daily and has not needed any extra. No LEE, PND or orthopnea.    HF Medications: Carvedilol 12.5 mg BID Entresto 49/51 mg BID Farxiga 10 mg daily Torsemide 10 mg daily BiDil 20/37.5 mg 0.5 tablet TID  Has the patient been experiencing any side effects to the medications prescribed? No  Does the patient have any problems obtaining medications due to transportation or finances? [employer] Medicare. Has [employer] grant for HF medications.   Understanding of regimen: good Understanding of indications: good Potential of compliance: good Patient understands to avoid NSAIDs. Patient understands to avoid decongestants.    Pertinent Lab Values: 12/18/22 Serum creatinine 1.7, BUN 53, Potassium 4.3, Sodium 139, BNP 208.3, Magnesium 2.4 (11/28/22) 12/31/22 Serum creatinine 1.54, BUN 30, Potassium 4.4, Sodium 139, BNP 238.1 -BMET today pending  Vital Signs:  Weight: 251.2 lbs (last clinic weight: 247 lbs) Blood pressure: 142/76 mmHg  Heart rate: 64 bpm   Assessment/Plan: Heart failure with reduced ejection fraction: Patient with a prior history of heart failure, noted to have reduced EF as far back as 2016.  Evidently recovered function and was taken off his heart failure medications. Recent exacerbation requiring hospitalization.  -NYHA II, Patient appears euvolemic on exam.  - BMET today pending - Continue torsemide 10 mg daily - Continue carvedilol 12.5 mg BID - Increase Entresto to 97/103 mg BID -Continue Farxiga 10 mg daily -Stop Bidil with plans to titrate up Entresto (patient has had hypotension in the past). I have asked him to stop Bidil but not to throw the medication away. It may need to be restarted in the future based on BP and HF symptoms.  -  Will continue to hold spironolactone for now. Will need to monitor renal function closely when resumed in the future. He is very concerned about spironolactone's effect on his renal function, will need close follow up if/when restarted. -Has a strong family history of heart failure, consider genetic testing at next appointment with MD.   2. Pulmonary hypertension: Severe, noted on prior right heart cath. Mixed pre and postcapillary pulmonary hypertension, VQ scan negative. Would need to evaluate pulmonary function, no history of autoimmune disease. -PFTs, sleep study -Consider autoimmune workup at future appointment -Would likely need repeat right heart cath when euvolemic to consider vasodilator therapy   3. Atrial flutter: Had previous flutter ablation, off of anticoagulation currently.   4. CKD: -BMET pending   Follow up in 1 month with Dr. Elwyn Lade.    Karle Plumber, PharmD, BCPS, BCCP, CPP Heart Failure Clinic Pharmacist [phone]

## 2023-01-12 ENCOUNTER — Ambulatory Visit (HOSPITAL_COMMUNITY)
Admission: RE | Admit: 2023-01-12 | Discharge: 2023-01-12 | Disposition: A | Discharge status: Home / Self Care | Payer: Medicare Other | Source: Ambulatory Visit | Attending: Internal Medicine | Admitting: Internal Medicine

## 2023-01-12 VITALS — BP 142/76 | HR 64 | Wt 251.2 lb

## 2023-01-12 DIAGNOSIS — N189 Chronic kidney disease, unspecified: Secondary | ICD-10-CM | POA: Insufficient documentation

## 2023-01-12 DIAGNOSIS — I272 Pulmonary hypertension, unspecified: Secondary | ICD-10-CM | POA: Insufficient documentation

## 2023-01-12 DIAGNOSIS — E1122 Type 2 diabetes mellitus with diabetic chronic kidney disease: Secondary | ICD-10-CM | POA: Insufficient documentation

## 2023-01-12 DIAGNOSIS — I130 Hypertensive heart and chronic kidney disease with heart failure and stage 1 through stage 4 chronic kidney disease, or unspecified chronic kidney disease: Secondary | ICD-10-CM | POA: Insufficient documentation

## 2023-01-12 DIAGNOSIS — I340 Nonrheumatic mitral (valve) insufficiency: Secondary | ICD-10-CM | POA: Diagnosis not present

## 2023-01-12 DIAGNOSIS — I4892 Unspecified atrial flutter: Secondary | ICD-10-CM | POA: Diagnosis not present

## 2023-01-12 DIAGNOSIS — K219 Gastro-esophageal reflux disease without esophagitis: Secondary | ICD-10-CM | POA: Insufficient documentation

## 2023-01-12 DIAGNOSIS — I5022 Chronic systolic (congestive) heart failure: Secondary | ICD-10-CM | POA: Diagnosis present

## 2023-01-12 DIAGNOSIS — E785 Hyperlipidemia, unspecified: Secondary | ICD-10-CM | POA: Insufficient documentation

## 2023-01-12 LAB — BASIC METABOLIC PANEL
Anion gap: 8 (ref 5–15)
BUN: 32 mg/dL — ABNORMAL HIGH (ref 6–20)
CO2: 24 mmol/L (ref 22–32)
Calcium: 9.3 mg/dL (ref 8.9–10.3)
Chloride: 108 mmol/L (ref 98–111)
Creatinine, Ser: 1.34 mg/dL — ABNORMAL HIGH (ref 0.61–1.24)
GFR, Estimated: >60 mL/min (ref >60)
Glucose, Bld: 88 mg/dL (ref 70–99)
Potassium: 4.7 mmol/L (ref 3.5–5.1)
Sodium: 140 mmol/L (ref 135–145)

## 2023-01-12 MED ORDER — ENTRESTO 97-103 MG PO TABS: 1.0000 | ORAL_TABLET | Freq: Two times a day (BID) | ORAL | 11 refills | Status: AC

## 2023-01-12 NOTE — Patient Instructions (Signed)
It was a pleasure seeing you today!  MEDICATIONS: -We are changing your medications today --Increase Entresto to 97/103 mg (1 tablet) twice daily. You may take 2 tablets of the 49/51 mg strength twice daily until you receive the new strength.  -Stop Bidil (isosorbide-hydralazine).  -Call if you have questions about your medications.  LABS: -We will call you if your labs need attention.  NEXT APPOINTMENT: Return to clinic in 1 month with Dr. Elwyn Lade.  In general, to take care of your heart failure: -Limit your fluid intake to 2 Liters (half-gallon) per day.   -Limit your salt intake to ideally 2-3 grams (2000-3000 mg) per day. -Weigh yourself daily and record, and bring that "weight diary" to your next appointment.  (Weight gain of 2-3 pounds in 1 day typically means fluid weight.) -The medications for your heart are to help your heart and help you live longer.   -Please contact us before stopping any of your heart medications.  Call the clinic at [phone] with questions or to reschedule future appointments.

## 2023-02-08 ENCOUNTER — Inpatient Hospital Stay (HOSPITAL_COMMUNITY): Admission: RE | Admit: 2023-02-08 | Payer: Medicare Other | Source: Ambulatory Visit | Admitting: Cardiology

## 2023-02-08 NOTE — Progress Notes (Incomplete)
   ADVANCED HEART FAILURE FOLLOW UP CLINIC NOTE  Referring Physician: Jerrell Mylar, Demetrius Charity*  Primary Care: Jerrell Mylar, PA-C Primary Cardiologist:  HPI: Isaac Valdez is a 62 y.o. male with a PMH of DMII, PAF, A flutter ablation, OSA, HTN, HLD, GERD, mitral regurgitation, movement disorder, gout, ETOH abuse, cocaine last used 2009, and HFrEF who presents for follow up of ***.      Patient with a remote history of cocaine use, though heavy alcohol use starting in 2020. Had an echo in 2018 with EF 45-50%. In 2019 he underwent aflutter ablation. Eliquis was stopped. Prior LHC with minimal disease.   He was seen at [facility] ED on 11/13/22 with nausea, vomiting, abdominal bloating and increased dyspnea. NT-Pro BNP > 10000. Started on PPI and discharged to home.    He was seen in consultation at [facility] in the Neurology Movement Clinic for RUE jerking/twitching on 11/19/22. Work up concerning for dystonia. Imaging ordered. Says he felt terrible that day and went straight to [facility] for admit. He was found to be volume overloaded with AKI and newly reduced EF. He underwent RHC and diuresis and tolerated starting medical therapy well. See on 12/18/2022 where he appeared to be overmedicated and his hydralazine was reduced.  Since that time has been following with pharmacy and has been able to taper off BiDil and his Sherryll Burger was maximized.     SUBJECTIVE:  PMH, current medications, allergies, social history, and family history reviewed in epic.  PHYSICAL EXAM: There were no vitals filed for this visit. GENERAL: Well nourished and in no apparent distress at rest.  HEENT: The mucous membranes are pink and moist.   PULM:  Normal work of breathing, clear to auscultation bilaterally. Respirations are unlabored.  CARDIAC:  JVP: ***         Normal rate with regular rhythm. No murmurs, rubs or gallops.  *** edema.  ABDOMEN: Soft, non-tender, non-distended. NEUROLOGIC:  Patient is oriented x3 with no focal or lateralizing neurologic deficits.  PSYCH: Patients affect is appropriate, there is no evidence of anxiety or depression.  SKIN: Warm and dry; no lesions or wounds. Warm and well perfused extremities.  DATA REVIEW  ECG: ***  As per my personal interpretation  ECHO: *** As per my personal interpretation  CATH: *** As per my personal interpretation  Heart failure review: - Classification: {HFCLASS:30917} - Etiology: {Cardiomyopathy:30918} - NYHA Class:  - Volume status: {volumechf:30919} - ACEi/ARB/ARNI: {HF:30752} - Aldosterone antagonist: {HF:30752} - Beta-blocker: {HF:30752} - Digoxin: {HF:30752} - Hydralazine/Nitrates: {HF:30752} - SGLT2i: {HF:30752} - GLP-1: {GLP:30906} - Advanced therapies: {Advancedtherapies:30916} - ICD: {ICD:30901}  ASSESSMENT & PLAN:  ***    Isaac Hasten, MD Advanced Heart Failure Mechanical Circulatory Support 02/08/23

## 2023-02-09 ENCOUNTER — Ambulatory Visit (HOSPITAL_COMMUNITY)
Admission: RE | Admit: 2023-02-09 | Discharge: 2023-02-09 | Disposition: A | Discharge status: Home / Self Care | Payer: Medicare Other | Source: Ambulatory Visit | Attending: Cardiology | Admitting: Cardiology

## 2023-02-09 ENCOUNTER — Encounter (HOSPITAL_COMMUNITY): Payer: Self-pay | Admitting: Cardiology

## 2023-02-09 ENCOUNTER — Encounter (HOSPITAL_COMMUNITY): Payer: Medicare Other

## 2023-02-09 VITALS — BP 138/78 | HR 57 | Wt 257.4 lb

## 2023-02-09 DIAGNOSIS — I480 Paroxysmal atrial fibrillation: Secondary | ICD-10-CM | POA: Diagnosis present

## 2023-02-09 DIAGNOSIS — G259 Extrapyramidal and movement disorder, unspecified: Secondary | ICD-10-CM | POA: Insufficient documentation

## 2023-02-09 DIAGNOSIS — N182 Chronic kidney disease, stage 2 (mild): Secondary | ICD-10-CM

## 2023-02-09 DIAGNOSIS — Z79899 Other long term (current) drug therapy: Secondary | ICD-10-CM | POA: Diagnosis not present

## 2023-02-09 DIAGNOSIS — F1010 Alcohol abuse, uncomplicated: Secondary | ICD-10-CM | POA: Insufficient documentation

## 2023-02-09 DIAGNOSIS — I483 Typical atrial flutter: Secondary | ICD-10-CM

## 2023-02-09 DIAGNOSIS — E785 Hyperlipidemia, unspecified: Secondary | ICD-10-CM | POA: Diagnosis not present

## 2023-02-09 DIAGNOSIS — M109 Gout, unspecified: Secondary | ICD-10-CM | POA: Insufficient documentation

## 2023-02-09 DIAGNOSIS — G4733 Obstructive sleep apnea (adult) (pediatric): Secondary | ICD-10-CM | POA: Insufficient documentation

## 2023-02-09 DIAGNOSIS — I4892 Unspecified atrial flutter: Secondary | ICD-10-CM | POA: Insufficient documentation

## 2023-02-09 DIAGNOSIS — I340 Nonrheumatic mitral (valve) insufficiency: Secondary | ICD-10-CM | POA: Diagnosis not present

## 2023-02-09 DIAGNOSIS — I110 Hypertensive heart disease with heart failure: Secondary | ICD-10-CM | POA: Diagnosis present

## 2023-02-09 DIAGNOSIS — F1411 Cocaine abuse, in remission: Secondary | ICD-10-CM | POA: Insufficient documentation

## 2023-02-09 DIAGNOSIS — I272 Pulmonary hypertension, unspecified: Secondary | ICD-10-CM | POA: Diagnosis not present

## 2023-02-09 DIAGNOSIS — I5022 Chronic systolic (congestive) heart failure: Secondary | ICD-10-CM | POA: Diagnosis present

## 2023-02-09 DIAGNOSIS — N189 Chronic kidney disease, unspecified: Secondary | ICD-10-CM | POA: Insufficient documentation

## 2023-02-09 DIAGNOSIS — I130 Hypertensive heart and chronic kidney disease with heart failure and stage 1 through stage 4 chronic kidney disease, or unspecified chronic kidney disease: Secondary | ICD-10-CM | POA: Diagnosis not present

## 2023-02-09 DIAGNOSIS — I420 Dilated cardiomyopathy: Secondary | ICD-10-CM

## 2023-02-09 DIAGNOSIS — E1122 Type 2 diabetes mellitus with diabetic chronic kidney disease: Secondary | ICD-10-CM | POA: Insufficient documentation

## 2023-02-09 DIAGNOSIS — K219 Gastro-esophageal reflux disease without esophagitis: Secondary | ICD-10-CM | POA: Diagnosis not present

## 2023-02-09 MED ORDER — SPIRONOLACTONE 25 MG PO TABS: 12.5000 mg | ORAL_TABLET | Freq: Every day | ORAL | 3 refills | Status: DC

## 2023-02-09 NOTE — Progress Notes (Signed)
ADVANCED HEART FAILURE FOLLOW UP CLINIC NOTE  Referring Physician: Jerrell Mylar, Demetrius Charity*  Primary Care: Jerrell Mylar, PA-C Primary Cardiologist:  HPI: Isaac Valdez is a 62 y.o. male  with a PMH of DMII, PAF, A flutter ablation, OSA, HTN, HLD, GERD, mitral regurgitation, movement disorder, gout, ETOH abuse, cocaine last used 2009, and HFrEF who presents for follow up of heart failure.     Patient with a remote history of cocaine use, though heavy alcohol use starting in 2020. Had an echo in 2018 with EF 45-50%. In 2019 he underwent aflutter ablation. Eliquis was stopped. Prior LHC with minimal disease.   He was seen at [facility] ED on 11/13/22 with nausea, vomiting, abdominal bloating and increased dyspnea. NT-Pro BNP > 10000. Started on PPI and discharged to home.    He was seen in consultation at [facility] in the Neurology Movement Clinic for RUE jerking/twitching on 11/19/22. Work up concerning for dystonia. Imaging ordered. Says he felt terrible that day and went straight to [facility] for admit. He was found to be volume overloaded with AKI and newly reduced EF. He underwent RHC and diuresis and tolerated starting medical therapy well. See on 12/18/2022 where he appeared to be overmedicated and his hydralazine was reduced.  Since that time has been following with pharmacy and has been able to taper off BiDil and his Sherryll Burger was maximized.    SUBJECTIVE:  States that overall he has been doing well, gained some weight over thanksigiving. Started a new medication for his movement disorder, doesn't feel like it is helping yet but is titrating up the dose. Tolerating entresto well, no more hypotensive episodes, occasionally takes a dose of bidil for blood pressure >150. Takes torsemide 10mg  daily.   PMH, current medications, allergies, social history, and family history reviewed in epic.  PHYSICAL EXAM: Vitals:   02/09/23 0917  BP: 138/78  Pulse: (!) 57  SpO2:  99%   GENERAL: Well nourished and in no apparent distress at rest.  HEENT: The mucous membranes are pink and moist.   PULM:  Normal work of breathing, clear to auscultation bilaterally. Respirations are unlabored.  CARDIAC:  JVP: Not elevated         Normal rate with regular rhythm. No murmurs, rubs or gallops.  Trace edema.  ABDOMEN: Soft, non-tender, non-distended. NEUROLOGIC: Patient is oriented x3, persistent movement/dystonia, especially in the right upper extremity  PSYCH: Patients affect is appropriate, there is no evidence of anxiety or depression.  SKIN: Warm and dry; no lesions or wounds. Warm and well perfused extremities.  DATA REVIEW  ECG: 11/22/22: Sinus bradycardia with poor R wave progression     ECHO: 11/21/2022: LVEF 25 to 30%, moderately dilated LV, severely enlarged RV with moderately reduced function.  Elevated RVSP.  Moderate to severe mitral valve regurgitation, functional   CATH: 08/2022: No evidence of obstructive CAD  Heart failure review: - Classification: Heart failure with reduced EF - Etiology: Idiopathic - NYHA Class: II - Volume status: Euvolemic - ACEi/ARB/ARNI: Maximally tolerated dose - Aldosterone antagonist: Currently up-titrating - Beta-blocker: Currently up-titrating - Digoxin: Not indicated - Hydralazine/Nitrates: Plan to start at a subsequent visit - SGLT2i: Maximally tolerated dose - GLP-1: Consider in future - Advanced therapies: Not needed at this time - ICD: Currently uptitrating GDMT  ASSESSMENT & PLAN:  Heart failure with reduced ejection fraction: Patient with a prior history of heart failure, noted to have reduced EF as far back as 2016.  Evidently  recovered function and was taken off his heart failure medications.  Recent exacerbation requiring hospitalization. Transitioned to entresto with improvement in his symptoms, euvolemic.  Does have a strong family history of heart failure, genetic testing when available.  -Continue  Entresto 97/103mg  twice a day -Continue dapagliflozin 10 mg daily -Start spironolactone 12.5mg  daily -Basic metabolic panel at next visit -Continue torsemide 10 mg daily, 20mg  for volume symptoms -Genetic testing at future appointment -Repeat echo at next visit   Pulmonary hypertension: Severe, noted on prior right heart cath.  Mixed pre and postcapillary pulmonary hypertension, VQ scan negative.  Will need to evaluate pulmonary function, no history of autoimmune disease. -PFTs, sleep study -Prior autoimmune workup reviewed, unremarkable -Consider repeat RHC after echocardiogram    Atrial flutter: Had previous flutter ablation, off of anticoagulation currently.   CKD: Last creatinine noted to be 1.3. -Labs at next visit    Clearnce Hasten, MD Advanced Heart Failure Mechanical Circulatory Support 02/09/23

## 2023-02-09 NOTE — Patient Instructions (Signed)
START Spironolactone 12.5 mg daily.  Your physician has requested that you have an echocardiogram. Echocardiography is a painless test that uses sound waves to create images of your heart. It provides your doctor with information about the size and shape of your heart and how well your heart's chambers and valves are working. This procedure takes approximately one hour. There are no restrictions for this procedure. Please do NOT wear cologne, perfume, aftershave, or lotions (deodorant is allowed). Please arrive 15 minutes prior to your appointment time.  Please note: We ask at that you not bring children with you during ultrasound (echo/ vascular) testing. Due to room size and safety concerns, children are not allowed in the ultrasound rooms during exams. Our front office staff cannot provide observation of children in our lobby area while testing is being conducted. An adult accompanying a patient to their appointment will only be allowed in the ultrasound room at the discretion of the ultrasound technician under special circumstances. We apologize for any inconvenience.  Your physician recommends that you schedule a follow-up appointment in: 6 weeks.  If you have any questions or concerns before your next appointment please send [place] a message through [place] or call our office at [phone].    TO LEAVE A MESSAGE FOR THE NURSE SELECT OPTION 2, PLEASE LEAVE A MESSAGE INCLUDING: YOUR NAME DATE OF BIRTH CALL BACK NUMBER REASON FOR CALL**this is important as we prioritize the call backs  YOU WILL RECEIVE A CALL BACK THE SAME DAY AS LONG AS YOU CALL BEFORE 4:00 PM  At the Advanced Heart Failure Clinic, you and your health needs are our priority. As part of our continuing mission to provide you with exceptional heart care, we have created designated Provider Care Teams. These Care Teams include your primary Cardiologist (physician) and Advanced Practice Providers (APPs- Physician Assistants and  Nurse Practitioners) who all work together to provide you with the care you need, when you need it.   You may see any of the following providers on your designated Care Team at your next follow up: Dr Arvilla Meres Dr Marca Ancona Dr. Dorthula Nettles Dr. Clearnce Hasten Amy Filbert Schilder, NP Robbie Lis, [place] [facility] [place], [place] Brynda Peon, NP [place] Lee, NP Karle Plumber, PharmD   Please be sure to bring in all your medications bottles to every appointment.    Thank you for choosing [facility]

## 2023-02-10 ENCOUNTER — Other Ambulatory Visit: Payer: Self-pay

## 2023-02-19 ENCOUNTER — Emergency Department (HOSPITAL_BASED_OUTPATIENT_CLINIC_OR_DEPARTMENT_OTHER): Payer: Medicare Other

## 2023-02-19 ENCOUNTER — Other Ambulatory Visit: Payer: Self-pay

## 2023-02-19 ENCOUNTER — Encounter (HOSPITAL_BASED_OUTPATIENT_CLINIC_OR_DEPARTMENT_OTHER): Payer: Self-pay

## 2023-02-19 ENCOUNTER — Emergency Department (HOSPITAL_BASED_OUTPATIENT_CLINIC_OR_DEPARTMENT_OTHER)
Admission: EM | Admit: 2023-02-19 | Discharge: 2023-02-19 | Disposition: A | Discharge status: Home / Self Care | Payer: Medicare Other | Attending: Emergency Medicine | Admitting: Emergency Medicine

## 2023-02-19 DIAGNOSIS — I129 Hypertensive chronic kidney disease with stage 1 through stage 4 chronic kidney disease, or unspecified chronic kidney disease: Secondary | ICD-10-CM | POA: Insufficient documentation

## 2023-02-19 DIAGNOSIS — M25531 Pain in right wrist: Secondary | ICD-10-CM | POA: Insufficient documentation

## 2023-02-19 DIAGNOSIS — Z79899 Other long term (current) drug therapy: Secondary | ICD-10-CM | POA: Insufficient documentation

## 2023-02-19 DIAGNOSIS — N189 Chronic kidney disease, unspecified: Secondary | ICD-10-CM | POA: Insufficient documentation

## 2023-02-19 DIAGNOSIS — E1122 Type 2 diabetes mellitus with diabetic chronic kidney disease: Secondary | ICD-10-CM | POA: Diagnosis not present

## 2023-02-19 MED ORDER — DEXAMETHASONE 4 MG PO TABS
10.0000 mg | ORAL_TABLET | Freq: Once | ORAL | Status: AC
  Administered 2023-02-19: 10 mg via ORAL
  Filled 2023-02-19: qty 3

## 2023-02-19 MED ORDER — COLCHICINE 0.6 MG PO TABS
0.6000 mg | ORAL_TABLET | Freq: Every day | ORAL | 0 refills | Status: DC
Start: 2023-02-19 — End: 2023-04-19

## 2023-02-19 NOTE — ED Triage Notes (Signed)
Pt arrives with c/o right wrist pain that started about 2 days ago. Pt denies injury.

## 2023-02-19 NOTE — ED Notes (Signed)
Reviewed D/C information with the patient, pt verbalized understanding. No additional concerns at this time.

## 2023-02-19 NOTE — ED Provider Notes (Signed)
[place] [facility] Provider Note   CSN: [id number] Arrival date & time: 02/19/23  0131     History  Chief Complaint  Patient presents with   Wrist Pain    Isaac Valdez is a 63 y.o. male.  The history is provided by the patient.  Wrist Pain  Isaac Valdez is a 62 y.o. male who presents to the Emergency Department complaining of right wrist pain. Started 2-3 days ago.  No injuries.  Worse with movement.  Hx/o gout, usually in feet, but sometimes in hands.  Also has a history of CKD, hypertension, diabetes No fever, chest pain, sob.   No IV drug abuse.  No history of bloodstream infection.  Home Medications Prior to Admission medications   Medication Sig Start Date End Date Taking? Authorizing Provider  colchicine 0.6 MG tablet Take 1 tablet (0.6 mg total) by mouth daily. 02/19/23  Yes Tilden Fossa, MD  acetaminophen (TYLENOL) 500 MG tablet Take 1,000 mg by mouth 2 (two) times daily as needed for moderate pain.    [provider]  allopurinol (ZYLOPRIM) 100 MG tablet Take 50 mg by mouth daily. 12/31/22 03/31/23  [provider]  atorvastatin (LIPITOR) 20 MG tablet Take 20 mg by mouth daily.    [provider]  carvedilol (COREG) 12.5 MG tablet Take 1 tablet (12.5 mg total) by mouth 2 (two) times daily. 11/28/22   Alfredo Martinez, MD  clonazePAM (KLONOPIN) 0.5 MG tablet Take 1 tablet (0.5 mg total) by mouth 2 (two) times daily as needed. 11/28/22   Alfredo Martinez, MD  dapagliflozin propanediol (FARXIGA) 10 MG TABS tablet Take 1 tablet (10 mg total) by mouth daily. 12/31/22   Romie Minus, MD  diphenhydrAMINE (BENADRYL) 25 mg capsule Take 25 mg by mouth in the morning and at bedtime. Takes with clonazepam per Neurology    [provider]  glipiZIDE (GLUCOTROL XL) 5 MG 24 hr tablet Take 5 mg by mouth as needed. Bs greater  130    [provider]  pantoprazole (PROTONIX) 40 MG tablet Take 1  tablet (40 mg total) by mouth daily. 11/28/22   Alfredo Martinez, MD  polyethylene glycol (MIRALAX / GLYCOLAX) 17 g packet Take 17 g by mouth daily as needed for mild constipation, moderate constipation or severe constipation. 11/28/22   Alfredo Martinez, MD  sacubitril-valsartan (ENTRESTO) 97-103 MG Take 1 tablet by mouth 2 (two) times daily. 01/12/23   Romie Minus, MD  spironolactone (ALDACTONE) 25 MG tablet Take 0.5 tablets (12.5 mg total) by mouth daily. 02/09/23   Romie Minus, MD  torsemide (DEMADEX) 20 MG tablet Take 0.5 tablets (10 mg total) by mouth daily. 12/31/22   Romie Minus, MD  trihexyphenidyl (ARTANE) 2 MG tablet Take 2 mg by mouth 3 (three) times daily with meals.    [provider]      Allergies    Asa [aspirin]    Review of Systems   Review of Systems  All other systems reviewed and are negative.   Physical Exam Updated Vital Signs BP (!) 175/74   Pulse 69   Temp 98.3 F (36.8 C) (Oral)   Resp 19   Wt 116.6 kg   SpO2 100%   BMI 35.84 kg/m  Physical Exam Vitals and nursing note reviewed.  Constitutional:      Appearance: He is well-developed.  HENT:     Head: Normocephalic and atraumatic.  Cardiovascular:     Rate and  Rhythm: Normal rate and regular rhythm.  Pulmonary:     Effort: Pulmonary effort is normal. No respiratory distress.  Musculoskeletal:     Comments: Mild soft tissue swelling and tenderness to right lateral wrist without erythema.  Decreased ROM in right wrist.  Wiggles all digits. No swelling to the hand.   Skin:    General: Skin is warm and dry.  Neurological:     Mental Status: He is alert and oriented to person, place, and time.  Psychiatric:        Behavior: Behavior normal.     ED Results / Procedures / Treatments   Labs (all labs ordered are listed, but only abnormal results are displayed) Labs Reviewed - No data to display  EKG None  Radiology DG Wrist Complete Right Result Date:  02/19/2023 CLINICAL DATA:  Wrist pain EXAM: RIGHT WRIST - COMPLETE 3+ VIEW COMPARISON:  None Available. FINDINGS: There is no evidence of fracture or dislocation. There is no evidence of arthropathy or other focal bone abnormality. Soft tissues are unremarkable. IMPRESSION: Negative. Electronically Signed   By: Charlett Nose M.D.   On: 02/19/2023 02:21    Procedures Procedures    Medications Ordered in ED Medications  dexamethasone (DECADRON) tablet 10 mg (has no administration in time range)    ED Course/ Medical Decision Making/ A&P                                 Medical Decision Making Amount and/or Complexity of Data Reviewed Radiology: ordered.  Risk Prescription drug management.   Patient with history of gout here for evaluation of atraumatic right wrist pain.  He does have some mild swelling to the lateral aspect of the right wrist, mild tenderness to palpation with decreased range of motion.  X-ray without significant abnormality.  Current clinical picture is not consistent with septic arthritis.  Discussed with patient unclear if this is due to a strain versus gout.  Will treat with one-time dose of Decadron, provide prescription for colchicine if his pain does not improve with the Decadron.  Will provide a splint for comfort.  Discussed PCP follow-up and return precautions.        Final Clinical Impression(s) / ED Diagnoses Final diagnoses:  Right wrist pain    Rx / DC Orders ED Discharge Orders          Ordered    colchicine 0.6 MG tablet  Daily        02/19/23 0527              Tilden Fossa, MD 02/19/23 [phone]

## 2023-02-22 ENCOUNTER — Telehealth (HOSPITAL_COMMUNITY): Payer: Self-pay | Admitting: Cardiology

## 2023-02-22 NOTE — Telephone Encounter (Signed)
VM left -Says his BP has been up since his medications were changed. He saw Dr. Elwyn Lade 02/09/23.    Returned call to pt, reports b/p has been running high the past few days (4 days) Reports pt is currently on prednisone for wrist pain    Reported b/p 12/16 180/89 -when b/p is elevated like this will take bidil 1/2 tab. He is afraid to have b/p this high  States b/p was stable prior to steroids at 150/60    Please advise

## 2023-02-23 MED ORDER — ISOSORB DINITRATE-HYDRALAZINE 20-37.5 MG PO TABS: 0.5000 | ORAL_TABLET | Freq: Three times a day (TID) | ORAL | 6 refills | Status: DC

## 2023-02-23 NOTE — Telephone Encounter (Signed)
Pt aware and voiced understanding 

## 2023-03-22 ENCOUNTER — Ambulatory Visit (HOSPITAL_COMMUNITY): Payer: Medicare Other

## 2023-03-22 ENCOUNTER — Inpatient Hospital Stay (HOSPITAL_COMMUNITY): Admission: RE | Admit: 2023-03-22 | Payer: Medicare Other | Source: Ambulatory Visit | Admitting: Cardiology

## 2023-04-19 ENCOUNTER — Encounter (HOSPITAL_COMMUNITY): Payer: Self-pay | Admitting: Cardiology

## 2023-04-19 ENCOUNTER — Ambulatory Visit (HOSPITAL_BASED_OUTPATIENT_CLINIC_OR_DEPARTMENT_OTHER)
Admission: RE | Admit: 2023-04-19 | Discharge: 2023-04-19 | Disposition: A | Discharge status: Home / Self Care | Payer: Medicare Other | Source: Ambulatory Visit | Attending: Cardiology | Admitting: Cardiology

## 2023-04-19 ENCOUNTER — Ambulatory Visit (HOSPITAL_COMMUNITY)
Admission: RE | Admit: 2023-04-19 | Discharge: 2023-04-19 | Disposition: A | Discharge status: Home / Self Care | Payer: Medicare Other | Source: Ambulatory Visit | Attending: Cardiology

## 2023-04-19 VITALS — BP 160/80 | HR 69 | Wt 259.0 lb

## 2023-04-19 DIAGNOSIS — F1491 Cocaine use, unspecified, in remission: Secondary | ICD-10-CM | POA: Insufficient documentation

## 2023-04-19 DIAGNOSIS — I5022 Chronic systolic (congestive) heart failure: Secondary | ICD-10-CM

## 2023-04-19 DIAGNOSIS — N1831 Chronic kidney disease, stage 3a: Secondary | ICD-10-CM | POA: Insufficient documentation

## 2023-04-19 DIAGNOSIS — M109 Gout, unspecified: Secondary | ICD-10-CM | POA: Insufficient documentation

## 2023-04-19 DIAGNOSIS — I480 Paroxysmal atrial fibrillation: Secondary | ICD-10-CM | POA: Diagnosis not present

## 2023-04-19 DIAGNOSIS — I130 Hypertensive heart and chronic kidney disease with heart failure and stage 1 through stage 4 chronic kidney disease, or unspecified chronic kidney disease: Secondary | ICD-10-CM | POA: Diagnosis not present

## 2023-04-19 DIAGNOSIS — I4892 Unspecified atrial flutter: Secondary | ICD-10-CM | POA: Diagnosis not present

## 2023-04-19 DIAGNOSIS — I272 Pulmonary hypertension, unspecified: Secondary | ICD-10-CM | POA: Diagnosis not present

## 2023-04-19 DIAGNOSIS — E1122 Type 2 diabetes mellitus with diabetic chronic kidney disease: Secondary | ICD-10-CM | POA: Diagnosis not present

## 2023-04-19 DIAGNOSIS — F1010 Alcohol abuse, uncomplicated: Secondary | ICD-10-CM | POA: Diagnosis not present

## 2023-04-19 DIAGNOSIS — I340 Nonrheumatic mitral (valve) insufficiency: Secondary | ICD-10-CM | POA: Diagnosis not present

## 2023-04-19 DIAGNOSIS — I10 Essential (primary) hypertension: Secondary | ICD-10-CM

## 2023-04-19 DIAGNOSIS — G4733 Obstructive sleep apnea (adult) (pediatric): Secondary | ICD-10-CM | POA: Diagnosis not present

## 2023-04-19 DIAGNOSIS — K219 Gastro-esophageal reflux disease without esophagitis: Secondary | ICD-10-CM | POA: Insufficient documentation

## 2023-04-19 DIAGNOSIS — E785 Hyperlipidemia, unspecified: Secondary | ICD-10-CM | POA: Insufficient documentation

## 2023-04-19 DIAGNOSIS — Z006 Encounter for examination for normal comparison and control in clinical research program: Secondary | ICD-10-CM

## 2023-04-19 LAB — ECHOCARDIOGRAM COMPLETE
AR max vel: 1.15 cm2
AV Area VTI: 1.1 cm2
AV Area mean vel: 1.11 cm2
AV Mean grad: 10 mm[Hg]
AV Peak grad: 17.5 mm[Hg]
Ao pk vel: 2.09 m/s
Area-P 1/2: 2.28 cm2
Calc EF: 39.6 %
MV VTI: 1.77 cm2
P 1/2 time: 696 ms
S' Lateral: 4.2 cm
Single Plane A2C EF: 43.6 %
Single Plane A4C EF: 36.7 %

## 2023-04-19 NOTE — Progress Notes (Signed)
 ADVANCED HEART FAILURE FOLLOW UP CLINIC NOTE  Referring Physician: Andreas Bandy, Arlester Ladd*  Primary Care: Andreas Bandy, PA-C Primary Cardiologist:  HPI: Isaac Valdez is a 63 y.o. male  with a PMH of DMII, PAF, A flutter ablation, OSA, HTN, HLD, GERD, mitral regurgitation, movement disorder, gout, ETOH abuse, cocaine last used 2009, and HFrEF who presents for follow up of chronic systolic heart failure.     Patient with a remote history of cocaine use, though heavy alcohol use starting in 2020. Had an echo in 2018 with EF 45-50%. In 2019 he underwent aflutter ablation. Eliquis  was stopped. Prior LHC with minimal disease.   He was seen at [facility] ED on 11/13/22 with nausea, vomiting, abdominal bloating and increased dyspnea. NT-Pro BNP > 10000. Started on PPI and discharged to home.    He was seen in consultation at [facility] in the Neurology Movement Clinic for RUE jerking/twitching on 11/19/22. Work up concerning for dystonia. Imaging ordered. Says he felt terrible that day and went straight to [facility] for admit. He was found to be volume overloaded with AKI and newly reduced EF. He underwent RHC and diuresis and tolerated starting medical therapy well. See on 12/18/2022 where he appeared to be overmedicated and his hydralazine  was reduced.  Since that time has been following with pharmacy and has been able to taper off BiDil and his Entresto  was maximized.    SUBJECTIVE:  Overall has been doing well.  Reports that his blood pressure is usually in the 120s to 130s at home with occasional 1 teens.  He rarely sees anything above 140 so the 160 on arrival today is unusual.  He reports that his volume symptoms have been well-controlled.  He has no shortness of breath and his main problem is now his movement disorder for which he follows at [facility].  Has Botox  injection scheduled for his next visit.  PMH, current medications, allergies, social history, and family history  reviewed in epic.  PHYSICAL EXAM: Vitals:   04/19/23 1347  BP: (!) 160/80  Pulse: 69  SpO2: 100%   GENERAL: Well nourished and in no apparent distress at rest.  HEENT: The mucous membranes are pink and moist.   PULM:  Normal work of breathing, clear to auscultation bilaterally. CARDIAC:  JVP: Not elevated         Normal rate with regular rhythm. No murmurs, rubs or gallops.  Trace edema.  ABDOMEN: Soft, non-tender, non-distended. NEUROLOGIC: Patient is oriented x3, persistent movement/dystonia, especially in the right upper extremity   DATA REVIEW  ECG: 11/22/22: Sinus bradycardia with poor R wave progression     ECHO: 11/21/2022: LVEF 25 to 30%, moderately dilated LV, severely enlarged RV with moderately reduced function.  Elevated RVSP.  Moderate to severe mitral valve regurgitation, functional   CATH: 08/2022: No evidence of obstructive CAD  Heart failure review: - Classification: Heart failure with reduced EF - Etiology: Idiopathic - NYHA Class: II - Volume status: Euvolemic - ACEi/ARB/ARNI: Maximally tolerated dose - Aldosterone antagonist: Currently up-titrating - Beta-blocker: Currently up-titrating - Digoxin: Not indicated - Hydralazine /Nitrates: Plan to start at a subsequent visit - SGLT2i: Maximally tolerated dose - GLP-1: Consider in future - Advanced therapies: Not needed at this time - ICD: Currently uptitrating GDMT  ASSESSMENT & PLAN:  Heart failure with reduced ejection fraction: Patient with a prior history of heart failure, noted to have reduced EF as far back as 2016.  Evidently recovered function and was taken  off his heart failure medications.  Recent exacerbation requiring hospitalization.  Most recent echocardiogram 04/19/2023 with EF around 40%, reviewed with patient.  Blood pressure controlled at home. -Continue Entresto  97/103mg  twice a day -Continue dapagliflozin  10 mg daily -Continue spironolactone  12.5mg  daily -Recent labs from 03/2023 reviewed  and stable -Continue torsemide  10 mg daily, 20mg  for volume symptoms -Genetic testing at future appointment when available -Repeat echocardiogram reviewed with patient   Pulmonary hypertension: Severe, noted on prior right heart cath.  Mixed pre and postcapillary pulmonary hypertension, VQ scan negative.  Tricuspid regurgitation and adequate on most recent echocardiogram to evaluate pulmonary pressures, but no clear evidence of pulmonary hypertension.  Symptoms significantly improved on current regimen, patient would like to delay right heart cath at this time. -PFTs, sleep study pending -Prior autoimmune workup reviewed, unremarkable -Discussed right heart cath at next visit, holding as of now as symptoms are stable.   Atrial flutter: Had previous flutter ablation, off of anticoagulation currently.   CKD IIIa: Last creatinine noted to be 1.45 on 03/23/2023, potassium stable.. -Continue to follow    Arta Lark, MD Advanced Heart Failure Mechanical Circulatory Support 04/19/23

## 2023-04-19 NOTE — Patient Instructions (Signed)
 Medication Changes: No Changes In Medications at this time.   Follow-Up in: 3 months PLEASE CALL OUR OFFICE AROUND march  TO GET SCHEDULED FOR YOUR APPOINTMENT. PHONE NUMBER IS [phone] OPTION 2   At the [facility], you and your health needs are our priority. We have a designated team specialized in the treatment of Heart Failure. This Care Team includes your primary Heart Failure Specialized Cardiologist (physician), Advanced Practice Providers (APPs- Physician Assistants and Nurse Practitioners), and Pharmacist who all work together to provide you with the care you need, when you need it.   You may see any of the following providers on your designated Care Team at your next follow up:  Dr. Jules Oar Dr. Peder Bourdon Dr. Alwin Baars Dr. Judyth Nunnery Nieves Bars, NP Ruddy Corral, [place] [facility] Castella, [place] Dennise Fitz, NP [place] Lee, NP Luster Salters, PharmD   Please be sure to bring in all your medications bottles to every appointment.   Need to Contact Us :  If you have any questions or concerns before your next appointment please send us  a message through [facility] or call our office at [phone].    TO LEAVE A MESSAGE FOR THE NURSE SELECT OPTION 2, PLEASE LEAVE A MESSAGE INCLUDING: YOUR NAME DATE OF BIRTH CALL BACK NUMBER REASON FOR CALL**this is important as we prioritize the call backs  YOU WILL RECEIVE A CALL BACK THE SAME DAY AS LONG AS YOU CALL BEFORE 4:00 PM

## 2023-04-19 NOTE — Research (Signed)
SITE: 050     Subject # [id number]    Subprotocol: A  Inclusion Criteria  Patients who meet all of the following criteria are eligible for enrollment as study participants:  Yes No  Age > 61 years old X   Eligible to wear Holter Study X    Exclusion Criteria  Patients who meet any of these criteria are not eligible for enrollment as study participants: Yes No  1. Receiving any mechanical (respiratory or circulatory) or renal support therapy at Screening or during Visit #1.  X  2.  Any other conditions that in the opinion of the investigators are likely to prevent compliance with the study protocol or pose a safety concern if the subject participates in the study.  X  3. Poor tolerance, namely susceptible to severe skin allergies from ECG adhesive patch application.  X   Protocol: REV H                                     Residential Zip [address] (First 3 digits ONLY)                                             [employer] Informed Consent   Subject Name: Isaac Valdez  Subject met inclusion and exclusion criteria.  The informed consent form, study requirements and expectations were reviewed with the subject. Subject had opportunity to read consent and questions and concerns were addressed prior to the signing of the consent form.  The subject verbalized understanding of the trial requirements.  The subject agreed to participate in the [employer] EF ACT trial and signed the informed consent at 13:40 on 19-Apr-2023.  The informed consent was obtained prior to performance of any protocol-specific procedures for the subject.  A copy of the signed informed consent was given to the subject and a copy was placed in the subject's medical record.   Dyanne Iha          Current Outpatient Medications:    acetaminophen (TYLENOL) 500 MG tablet, Take 1,000 mg by mouth 2 (two) times daily as needed for moderate pain., Disp: , Rfl:    atorvastatin (LIPITOR) 20 MG tablet, Take 20 mg by mouth daily.,  Disp: , Rfl:    carvedilol (COREG) 12.5 MG tablet, Take 1 tablet (12.5 mg total) by mouth 2 (two) times daily., Disp: 30 tablet, Rfl: 0   clonazePAM (KLONOPIN) 0.5 MG tablet, Take 1 tablet (0.5 mg total) by mouth 2 (two) times daily as needed., Disp: , Rfl:    colchicine 0.6 MG tablet, Take 0.6 mg by mouth as needed., Disp: , Rfl:    dapagliflozin propanediol (FARXIGA) 10 MG TABS tablet, Take 1 tablet (10 mg total) by mouth daily., Disp: 30 tablet, Rfl: 11   diphenhydrAMINE (BENADRYL) 25 mg capsule, Take 25 mg by mouth in the morning and at bedtime. Takes with clonazepam per Neurology, Disp: , Rfl:    pantoprazole (PROTONIX) 40 MG tablet, Take 1 tablet (40 mg total) by mouth daily., Disp: 30 tablet, Rfl: 0   polyethylene glycol (MIRALAX / GLYCOLAX) 17 g packet, Take 17 g by mouth daily as needed for mild constipation, moderate constipation or severe constipation., Disp: , Rfl:    sacubitril-valsartan (ENTRESTO) 97-103 MG, Take 1 tablet by mouth 2 (two)  times daily., Disp: 60 tablet, Rfl: 11   spironolactone (ALDACTONE) 25 MG tablet, Take 0.5 tablets (12.5 mg total) by mouth daily., Disp: 45 tablet, Rfl: 3   torsemide (DEMADEX) 20 MG tablet, Take 0.5 tablets (10 mg total) by mouth daily., Disp: 15 tablet, Rfl: 5

## 2023-05-24 NOTE — Addendum Note (Signed)
 Encounter addended by: Howell Rucks, RDCS on: 05/24/2023 7:19 AM  Actions taken: Imaging Exam ended

## 2023-07-29 ENCOUNTER — Telehealth (HOSPITAL_COMMUNITY): Payer: Self-pay

## 2023-07-29 NOTE — Telephone Encounter (Signed)
 Patient called wanting to know if you can write him a Clearance letter for his DO, it is due by June 1st. Please advise.

## 2023-07-31 ENCOUNTER — Other Ambulatory Visit (HOSPITAL_COMMUNITY): Payer: Self-pay | Admitting: Cardiology

## 2023-08-04 NOTE — Telephone Encounter (Signed)
 Patient called to request update on letter for [employer]   Advised request was in process and will notify once complete

## 2023-08-05 NOTE — Telephone Encounter (Signed)
Pt aware.

## 2023-10-18 ENCOUNTER — Other Ambulatory Visit (HOSPITAL_COMMUNITY): Payer: Self-pay | Admitting: Cardiology

## 2023-12-30 ENCOUNTER — Telehealth (HOSPITAL_COMMUNITY): Payer: Self-pay | Admitting: Cardiology

## 2023-12-30 NOTE — Telephone Encounter (Signed)
 Noted

## 2023-12-30 NOTE — Telephone Encounter (Signed)
 Pt stated he has a new cardiologist in the same  area he has moved. He no longer need [facility]

## 2024-04-01 ENCOUNTER — Other Ambulatory Visit (HOSPITAL_COMMUNITY): Payer: Self-pay | Admitting: Cardiology
